# Patient Record
Sex: Male | Born: 1960
Health system: Southern US, Community
[De-identification: ages and names within clinical notes are randomized; demographics above are authoritative.]

## PROBLEM LIST (undated history)

## (undated) DIAGNOSIS — R011 Cardiac murmur, unspecified: Secondary | ICD-10-CM

## (undated) DIAGNOSIS — D649 Anemia, unspecified: Secondary | ICD-10-CM

## (undated) DIAGNOSIS — R000 Tachycardia, unspecified: Secondary | ICD-10-CM

## (undated) DIAGNOSIS — I10 Essential (primary) hypertension: Secondary | ICD-10-CM

## (undated) DIAGNOSIS — F1020 Alcohol dependence, uncomplicated: Secondary | ICD-10-CM

## (undated) DIAGNOSIS — D696 Thrombocytopenia, unspecified: Secondary | ICD-10-CM

## (undated) HISTORY — DX: Alcohol dependence, uncomplicated: F10.20

## (undated) HISTORY — DX: Cardiac murmur, unspecified: R01.1

## (undated) HISTORY — DX: Tachycardia, unspecified: R00.0

## (undated) HISTORY — DX: Thrombocytopenia, unspecified: D69.6

## (undated) HISTORY — DX: Anemia, unspecified: D64.9

## (undated) HISTORY — PX: NO PAST SURGERIES: SHX2092

---

## 2003-03-02 ENCOUNTER — Other Ambulatory Visit: Payer: Self-pay

## 2004-12-08 ENCOUNTER — Emergency Department: Payer: Self-pay | Admitting: Emergency Medicine

## 2006-02-24 ENCOUNTER — Inpatient Hospital Stay: Payer: Self-pay | Admitting: Internal Medicine

## 2006-02-24 ENCOUNTER — Other Ambulatory Visit: Payer: Self-pay

## 2006-12-26 ENCOUNTER — Emergency Department (HOSPITAL_COMMUNITY): Admission: EM | Admit: 2006-12-26 | Discharge: 2006-12-26 | Payer: Self-pay | Admitting: Emergency Medicine

## 2007-04-23 ENCOUNTER — Emergency Department (HOSPITAL_COMMUNITY): Admission: EM | Admit: 2007-04-23 | Discharge: 2007-04-23 | Payer: Self-pay | Admitting: Emergency Medicine

## 2007-05-26 ENCOUNTER — Emergency Department (HOSPITAL_COMMUNITY): Admission: EM | Admit: 2007-05-26 | Discharge: 2007-05-26 | Payer: Self-pay | Admitting: Emergency Medicine

## 2008-09-21 ENCOUNTER — Ambulatory Visit: Payer: Self-pay | Admitting: Internal Medicine

## 2008-09-21 ENCOUNTER — Inpatient Hospital Stay (HOSPITAL_COMMUNITY): Admission: EM | Admit: 2008-09-21 | Discharge: 2008-09-22 | Payer: Self-pay | Admitting: Emergency Medicine

## 2008-09-21 HISTORY — PX: ESOPHAGOGASTRODUODENOSCOPY: SHX1529

## 2008-10-12 ENCOUNTER — Emergency Department (HOSPITAL_COMMUNITY): Admission: EM | Admit: 2008-10-12 | Discharge: 2008-10-12 | Payer: Self-pay | Admitting: Internal Medicine

## 2008-10-22 ENCOUNTER — Encounter: Payer: Self-pay | Admitting: Internal Medicine

## 2008-11-26 ENCOUNTER — Emergency Department (HOSPITAL_COMMUNITY): Admission: EM | Admit: 2008-11-26 | Discharge: 2008-11-26 | Payer: Self-pay | Admitting: Emergency Medicine

## 2008-12-11 ENCOUNTER — Observation Stay: Payer: Self-pay | Admitting: Internal Medicine

## 2008-12-27 ENCOUNTER — Encounter (INDEPENDENT_AMBULATORY_CARE_PROVIDER_SITE_OTHER): Payer: Self-pay | Admitting: *Deleted

## 2009-02-19 ENCOUNTER — Emergency Department (HOSPITAL_COMMUNITY): Admission: EM | Admit: 2009-02-19 | Discharge: 2009-02-20 | Payer: Self-pay | Admitting: Emergency Medicine

## 2009-03-17 ENCOUNTER — Emergency Department (HOSPITAL_COMMUNITY): Admission: EM | Admit: 2009-03-17 | Discharge: 2009-03-17 | Payer: Self-pay | Admitting: Emergency Medicine

## 2009-04-28 ENCOUNTER — Emergency Department (HOSPITAL_COMMUNITY): Admission: EM | Admit: 2009-04-28 | Discharge: 2009-04-28 | Payer: Self-pay | Admitting: Emergency Medicine

## 2009-05-15 ENCOUNTER — Emergency Department: Payer: Self-pay | Admitting: Emergency Medicine

## 2009-06-05 ENCOUNTER — Emergency Department: Payer: Self-pay | Admitting: Emergency Medicine

## 2009-06-20 ENCOUNTER — Emergency Department (HOSPITAL_COMMUNITY): Admission: EM | Admit: 2009-06-20 | Discharge: 2009-06-20 | Payer: Self-pay | Admitting: Emergency Medicine

## 2009-06-28 ENCOUNTER — Inpatient Hospital Stay: Payer: Self-pay | Admitting: Psychiatry

## 2009-07-13 ENCOUNTER — Emergency Department: Payer: Self-pay | Admitting: Emergency Medicine

## 2010-04-15 NOTE — Consult Note (Signed)
Summary: Consultation Report  Consultation Report   Imported By: Diana Eves 10/22/2008 14:34:20  _____________________________________________________________________  External Attachment:    Type:   Image     Comment:   External Document

## 2010-06-01 LAB — BASIC METABOLIC PANEL
BUN: 12 mg/dL (ref 6–23)
CO2: 28 mEq/L (ref 19–32)
Calcium: 9 mg/dL (ref 8.4–10.5)
Chloride: 93 mEq/L — ABNORMAL LOW (ref 96–112)
Creatinine, Ser: 0.9 mg/dL (ref 0.4–1.5)
GFR calc Af Amer: >60 mL/min (ref >60)
GFR calc non Af Amer: >60 mL/min (ref >60)
Glucose, Bld: 97 mg/dL (ref 70–99)
Potassium: 3.6 mEq/L (ref 3.5–5.1)
Sodium: 128 mEq/L — ABNORMAL LOW (ref 135–145)

## 2010-06-04 LAB — RAPID URINE DRUG SCREEN, HOSP PERFORMED
Amphetamines: NOT DETECTED
Amphetamines: NOT DETECTED
Barbiturates: NOT DETECTED
Barbiturates: NOT DETECTED
Benzodiazepines: NOT DETECTED
Benzodiazepines: NOT DETECTED
Cocaine: NOT DETECTED
Cocaine: NOT DETECTED
Opiates: NOT DETECTED
Opiates: NOT DETECTED
Tetrahydrocannabinol: NOT DETECTED
Tetrahydrocannabinol: NOT DETECTED

## 2010-06-04 LAB — DIFFERENTIAL
Band Neutrophils: 0 % (ref 0–10)
Basophils Absolute: 0 10*3/uL (ref 0.0–0.1)
Basophils Absolute: 0.1 10*3/uL (ref 0.0–0.1)
Basophils Relative: 0 % (ref 0–1)
Basophils Relative: 2 % — ABNORMAL HIGH (ref 0–1)
Blasts: 0 %
Eosinophils Absolute: 0.7 10*3/uL (ref 0.0–0.7)
Eosinophils Absolute: 0.1 10*3/uL (ref 0.0–0.7)
Eosinophils Relative: 11 % — ABNORMAL HIGH (ref 0–5)
Eosinophils Relative: 2 % (ref 0–5)
Lymphocytes Relative: 36 % (ref 12–46)
Lymphocytes Relative: 16 % (ref 12–46)
Lymphs Abs: 2.4 10*3/uL (ref 0.7–4.0)
Lymphs Abs: 0.9 10*3/uL (ref 0.7–4.0)
Metamyelocytes Relative: 0 %
Monocytes Absolute: 0.5 10*3/uL (ref 0.1–1.0)
Monocytes Absolute: 0.7 10*3/uL (ref 0.1–1.0)
Monocytes Relative: 8 % (ref 3–12)
Monocytes Relative: 12 % (ref 3–12)
Myelocytes: 0 %
Neutro Abs: 2.9 10*3/uL (ref 1.7–7.7)
Neutro Abs: 4 10*3/uL (ref 1.7–7.7)
Neutrophils Relative %: 45 % (ref 43–77)
Neutrophils Relative %: 68 % (ref 43–77)
Promyelocytes Absolute: 0 %
nRBC: 0 /100 WBC

## 2010-06-04 LAB — CBC
HCT: 31 % — ABNORMAL LOW (ref 39.0–52.0)
HCT: 28.3 % — ABNORMAL LOW (ref 39.0–52.0)
Hemoglobin: 10.1 g/dL — ABNORMAL LOW (ref 13.0–17.0)
Hemoglobin: 9.1 g/dL — ABNORMAL LOW (ref 13.0–17.0)
MCHC: 32.6 g/dL (ref 30.0–36.0)
MCHC: 32.3 g/dL (ref 30.0–36.0)
MCV: 80 fL (ref 78.0–100.0)
MCV: 75.5 fL — ABNORMAL LOW (ref 78.0–100.0)
Platelets: 150 10*3/uL (ref 150–400)
Platelets: 157 10*3/uL (ref 150–400)
RBC: 3.88 MIL/uL — ABNORMAL LOW (ref 4.22–5.81)
RBC: 3.75 MIL/uL — ABNORMAL LOW (ref 4.22–5.81)
RDW: 28.3 % — ABNORMAL HIGH (ref 11.5–15.5)
RDW: 18.9 % — ABNORMAL HIGH (ref 11.5–15.5)
WBC: 6.5 10*3/uL (ref 4.0–10.5)
WBC: 5.8 10*3/uL (ref 4.0–10.5)

## 2010-06-04 LAB — BASIC METABOLIC PANEL
BUN: 7 mg/dL (ref 6–23)
BUN: 11 mg/dL (ref 6–23)
CO2: 25 mEq/L (ref 19–32)
CO2: 27 mEq/L (ref 19–32)
Calcium: 8.6 mg/dL (ref 8.4–10.5)
Calcium: 9 mg/dL (ref 8.4–10.5)
Chloride: 104 mEq/L (ref 96–112)
Chloride: 98 mEq/L (ref 96–112)
Creatinine, Ser: 0.74 mg/dL (ref 0.4–1.5)
Creatinine, Ser: 0.77 mg/dL (ref 0.4–1.5)
GFR calc Af Amer: >60 mL/min (ref >60)
GFR calc Af Amer: >60 mL/min (ref >60)
GFR calc non Af Amer: >60 mL/min (ref >60)
GFR calc non Af Amer: >60 mL/min (ref >60)
Glucose, Bld: 93 mg/dL (ref 70–99)
Glucose, Bld: 100 mg/dL — ABNORMAL HIGH (ref 70–99)
Potassium: 3.6 mEq/L (ref 3.5–5.1)
Potassium: 3.5 mEq/L (ref 3.5–5.1)
Sodium: 135 mEq/L (ref 135–145)
Sodium: 133 mEq/L — ABNORMAL LOW (ref 135–145)

## 2010-06-04 LAB — ETHANOL
Alcohol, Ethyl (B): 183 mg/dL — ABNORMAL HIGH (ref 0–10)
Alcohol, Ethyl (B): 264 mg/dL — ABNORMAL HIGH (ref 0–10)
Alcohol, Ethyl (B): <5 mg/dL (ref 0–10)

## 2010-06-04 LAB — POCT CARDIAC MARKERS
CKMB, poc: <1 ng/mL — ABNORMAL LOW (ref 1.0–8.0)
Myoglobin, poc: 42.2 ng/mL (ref 12–200)
Troponin i, poc: <0.05 ng/mL (ref 0.00–0.09)

## 2010-06-17 LAB — POCT CARDIAC MARKERS
CKMB, poc: <1 ng/mL — ABNORMAL LOW (ref 1.0–8.0)
CKMB, poc: <1 ng/mL — ABNORMAL LOW (ref 1.0–8.0)
Myoglobin, poc: 74.3 ng/mL (ref 12–200)
Myoglobin, poc: 79.9 ng/mL (ref 12–200)
Troponin i, poc: <0.05 ng/mL (ref 0.00–0.09)
Troponin i, poc: <0.05 ng/mL (ref 0.00–0.09)

## 2010-06-17 LAB — DIFFERENTIAL
Basophils Absolute: 0 10*3/uL (ref 0.0–0.1)
Basophils Relative: 0 % (ref 0–1)
Eosinophils Absolute: 0.1 10*3/uL (ref 0.0–0.7)
Eosinophils Relative: 1 % (ref 0–5)
Lymphocytes Relative: 19 % (ref 12–46)
Lymphs Abs: 1.3 10*3/uL (ref 0.7–4.0)
Monocytes Absolute: 0.8 10*3/uL (ref 0.1–1.0)
Monocytes Relative: 12 % (ref 3–12)
Neutro Abs: 4.6 10*3/uL (ref 1.7–7.7)
Neutrophils Relative %: 68 % (ref 43–77)

## 2010-06-17 LAB — BASIC METABOLIC PANEL
BUN: 7 mg/dL (ref 6–23)
CO2: 28 mEq/L (ref 19–32)
Calcium: 9.3 mg/dL (ref 8.4–10.5)
Chloride: 96 mEq/L (ref 96–112)
Creatinine, Ser: 0.85 mg/dL (ref 0.4–1.5)
GFR calc Af Amer: >60 mL/min (ref >60)
GFR calc non Af Amer: >60 mL/min (ref >60)
Glucose, Bld: 96 mg/dL (ref 70–99)
Potassium: 3.4 mEq/L — ABNORMAL LOW (ref 3.5–5.1)
Sodium: 132 mEq/L — ABNORMAL LOW (ref 135–145)

## 2010-06-17 LAB — CBC
HCT: 34 % — ABNORMAL LOW (ref 39.0–52.0)
Hemoglobin: 10.9 g/dL — ABNORMAL LOW (ref 13.0–17.0)
MCHC: 32.1 g/dL (ref 30.0–36.0)
MCV: 76.8 fL — ABNORMAL LOW (ref 78.0–100.0)
Platelets: 148 10*3/uL — ABNORMAL LOW (ref 150–400)
RBC: 4.42 MIL/uL (ref 4.22–5.81)
RDW: 18.1 % — ABNORMAL HIGH (ref 11.5–15.5)
WBC: 6.8 10*3/uL (ref 4.0–10.5)

## 2010-06-17 LAB — D-DIMER, QUANTITATIVE: D-Dimer, Quant: 0.22 ug/mL-FEU (ref 0.00–0.48)

## 2010-06-20 LAB — BASIC METABOLIC PANEL
BUN: 4 mg/dL — ABNORMAL LOW (ref 6–23)
CO2: 30 mEq/L (ref 19–32)
Calcium: 9.1 mg/dL (ref 8.4–10.5)
Chloride: 98 mEq/L (ref 96–112)
Creatinine, Ser: 0.98 mg/dL (ref 0.4–1.5)
GFR calc Af Amer: >60 mL/min (ref >60)
GFR calc non Af Amer: >60 mL/min (ref >60)
Glucose, Bld: 164 mg/dL — ABNORMAL HIGH (ref 70–99)
Potassium: 3.3 mEq/L — ABNORMAL LOW (ref 3.5–5.1)
Sodium: 133 mEq/L — ABNORMAL LOW (ref 135–145)

## 2010-06-20 LAB — DIFFERENTIAL
Band Neutrophils: 0 % (ref 0–10)
Basophils Absolute: 0 10*3/uL (ref 0.0–0.1)
Basophils Relative: 1 % (ref 0–1)
Blasts: 0 %
Eosinophils Absolute: 0.1 10*3/uL (ref 0.0–0.7)
Eosinophils Relative: 3 % (ref 0–5)
Lymphocytes Relative: 24 % (ref 12–46)
Lymphs Abs: 1.2 10*3/uL (ref 0.7–4.0)
Metamyelocytes Relative: 0 %
Monocytes Absolute: 0.7 10*3/uL (ref 0.1–1.0)
Monocytes Relative: 14 % — ABNORMAL HIGH (ref 3–12)
Myelocytes: 0 %
Neutro Abs: 2.8 10*3/uL (ref 1.7–7.7)
Neutrophils Relative %: 58 % (ref 43–77)
Promyelocytes Absolute: 0 %
WBC Morphology: INCREASED
nRBC: 0 /100 WBC

## 2010-06-20 LAB — POCT CARDIAC MARKERS
CKMB, poc: <1 ng/mL — ABNORMAL LOW (ref 1.0–8.0)
CKMB, poc: <1 ng/mL — ABNORMAL LOW (ref 1.0–8.0)
Myoglobin, poc: 43.7 ng/mL (ref 12–200)
Myoglobin, poc: 45.4 ng/mL (ref 12–200)
Troponin i, poc: <0.05 ng/mL (ref 0.00–0.09)
Troponin i, poc: <0.05 ng/mL (ref 0.00–0.09)

## 2010-06-20 LAB — CBC
HCT: 32.5 % — ABNORMAL LOW (ref 39.0–52.0)
Hemoglobin: 10.7 g/dL — ABNORMAL LOW (ref 13.0–17.0)
MCHC: 33 g/dL (ref 30.0–36.0)
MCV: 74.2 fL — ABNORMAL LOW (ref 78.0–100.0)
Platelets: 115 10*3/uL — ABNORMAL LOW (ref 150–400)
RBC: 4.38 MIL/uL (ref 4.22–5.81)
RDW: 20.2 % — ABNORMAL HIGH (ref 11.5–15.5)
WBC: 4.8 10*3/uL (ref 4.0–10.5)

## 2010-06-22 LAB — DIFFERENTIAL
Band Neutrophils: 0 % (ref 0–10)
Band Neutrophils: 0 % (ref 0–10)
Band Neutrophils: 0 % (ref 0–10)
Basophils Absolute: 0 10*3/uL (ref 0.0–0.1)
Basophils Absolute: 0 10*3/uL (ref 0.0–0.1)
Basophils Absolute: 0 10*3/uL (ref 0.0–0.1)
Basophils Absolute: 0 10*3/uL (ref 0.0–0.1)
Basophils Relative: 0 % (ref 0–1)
Basophils Relative: 0 % (ref 0–1)
Basophils Relative: 0 % (ref 0–1)
Basophils Relative: 1 % (ref 0–1)
Blasts: 0 %
Blasts: 0 %
Blasts: 0 %
Eosinophils Absolute: 0.5 10*3/uL (ref 0.0–0.7)
Eosinophils Absolute: 0.4 10*3/uL (ref 0.0–0.7)
Eosinophils Absolute: 0.5 10*3/uL (ref 0.0–0.7)
Eosinophils Absolute: 0.5 10*3/uL (ref 0.0–0.7)
Eosinophils Relative: 8 % — ABNORMAL HIGH (ref 0–5)
Eosinophils Relative: 8 % — ABNORMAL HIGH (ref 0–5)
Eosinophils Relative: 9 % — ABNORMAL HIGH (ref 0–5)
Eosinophils Relative: 9 % — ABNORMAL HIGH (ref 0–5)
Lymphocytes Relative: 32 % (ref 12–46)
Lymphocytes Relative: 30 % (ref 12–46)
Lymphocytes Relative: 37 % (ref 12–46)
Lymphocytes Relative: 37 % (ref 12–46)
Lymphs Abs: 2.1 10*3/uL (ref 0.7–4.0)
Lymphs Abs: 1.4 10*3/uL (ref 0.7–4.0)
Lymphs Abs: 1.9 10*3/uL (ref 0.7–4.0)
Lymphs Abs: 1.9 10*3/uL (ref 0.7–4.0)
Metamyelocytes Relative: 0 %
Metamyelocytes Relative: 0 %
Metamyelocytes Relative: 0 %
Monocytes Absolute: 0.3 10*3/uL (ref 0.1–1.0)
Monocytes Absolute: 0.2 10*3/uL (ref 0.1–1.0)
Monocytes Absolute: 0.2 10*3/uL (ref 0.1–1.0)
Monocytes Absolute: 0.5 10*3/uL (ref 0.1–1.0)
Monocytes Relative: 4 % (ref 3–12)
Monocytes Relative: 11 % (ref 3–12)
Monocytes Relative: 3 % (ref 3–12)
Monocytes Relative: 3 % (ref 3–12)
Myelocytes: 0 %
Myelocytes: 0 %
Myelocytes: 0 %
Neutro Abs: 3.6 10*3/uL (ref 1.7–7.7)
Neutro Abs: 2.2 10*3/uL (ref 1.7–7.7)
Neutro Abs: 2.4 10*3/uL (ref 1.7–7.7)
Neutro Abs: 2.4 10*3/uL (ref 1.7–7.7)
Neutrophils Relative %: 56 % (ref 43–77)
Neutrophils Relative %: 50 % (ref 43–77)
Neutrophils Relative %: 51 % (ref 43–77)
Neutrophils Relative %: 51 % (ref 43–77)
Promyelocytes Absolute: 0 %
Promyelocytes Absolute: 0 %
Promyelocytes Absolute: 0 %
Smear Review: DECREASED
nRBC: 0 /100 WBC
nRBC: 0 /100 WBC
nRBC: 0 /100 WBC

## 2010-06-22 LAB — CBC
HCT: 29.5 % — ABNORMAL LOW (ref 39.0–52.0)
HCT: 20.7 % — ABNORMAL LOW (ref 39.0–52.0)
HCT: 20.9 % — ABNORMAL LOW (ref 39.0–52.0)
HCT: 24.7 % — ABNORMAL LOW (ref 39.0–52.0)
HCT: 25 % — ABNORMAL LOW (ref 39.0–52.0)
Hemoglobin: 9.8 g/dL — ABNORMAL LOW (ref 13.0–17.0)
Hemoglobin: 6.7 g/dL — CL (ref 13.0–17.0)
Hemoglobin: 6.8 g/dL — CL (ref 13.0–17.0)
Hemoglobin: 8.2 g/dL — ABNORMAL LOW (ref 13.0–17.0)
Hemoglobin: 8.3 g/dL — ABNORMAL LOW (ref 13.0–17.0)
MCHC: 33.1 g/dL (ref 30.0–36.0)
MCHC: 32.3 g/dL (ref 30.0–36.0)
MCHC: 32.5 g/dL (ref 30.0–36.0)
MCHC: 33.3 g/dL (ref 30.0–36.0)
MCHC: 33.3 g/dL (ref 30.0–36.0)
MCV: 73.2 fL — ABNORMAL LOW (ref 78.0–100.0)
MCV: 70.8 fL — ABNORMAL LOW (ref 78.0–100.0)
MCV: 71.4 fL — ABNORMAL LOW (ref 78.0–100.0)
MCV: 75.4 fL — ABNORMAL LOW (ref 78.0–100.0)
MCV: 75.6 fL — ABNORMAL LOW (ref 78.0–100.0)
Platelets: 116 10*3/uL — ABNORMAL LOW (ref 150–400)
Platelets: 116 10*3/uL — ABNORMAL LOW (ref 150–400)
Platelets: 123 10*3/uL — ABNORMAL LOW (ref 150–400)
Platelets: 95 10*3/uL — ABNORMAL LOW (ref 150–400)
Platelets: 97 10*3/uL — ABNORMAL LOW (ref 150–400)
RBC: 4.02 MIL/uL — ABNORMAL LOW (ref 4.22–5.81)
RBC: 2.92 MIL/uL — ABNORMAL LOW (ref 4.22–5.81)
RBC: 2.93 MIL/uL — ABNORMAL LOW (ref 4.22–5.81)
RBC: 3.27 MIL/uL — ABNORMAL LOW (ref 4.22–5.81)
RBC: 3.31 MIL/uL — ABNORMAL LOW (ref 4.22–5.81)
RDW: 21.2 % — ABNORMAL HIGH (ref 11.5–15.5)
RDW: 18.3 % — ABNORMAL HIGH (ref 11.5–15.5)
RDW: 18.7 % — ABNORMAL HIGH (ref 11.5–15.5)
RDW: 20.6 % — ABNORMAL HIGH (ref 11.5–15.5)
RDW: 20.9 % — ABNORMAL HIGH (ref 11.5–15.5)
WBC: 6.5 10*3/uL (ref 4.0–10.5)
WBC: 3.9 10*3/uL — ABNORMAL LOW (ref 4.0–10.5)
WBC: 4.5 10*3/uL (ref 4.0–10.5)
WBC: 5 10*3/uL (ref 4.0–10.5)
WBC: 5 10*3/uL (ref 4.0–10.5)

## 2010-06-22 LAB — BASIC METABOLIC PANEL
BUN: 8 mg/dL (ref 6–23)
BUN: 10 mg/dL (ref 6–23)
BUN: 10 mg/dL (ref 6–23)
BUN: 11 mg/dL (ref 6–23)
CO2: 26 mEq/L (ref 19–32)
CO2: 26 mEq/L (ref 19–32)
CO2: 26 mEq/L (ref 19–32)
CO2: 27 mEq/L (ref 19–32)
Calcium: 8.9 mg/dL (ref 8.4–10.5)
Calcium: 8.5 mg/dL (ref 8.4–10.5)
Calcium: 8.7 mg/dL (ref 8.4–10.5)
Calcium: 8.8 mg/dL (ref 8.4–10.5)
Chloride: 98 mEq/L (ref 96–112)
Chloride: 100 mEq/L (ref 96–112)
Chloride: 100 mEq/L (ref 96–112)
Chloride: 99 mEq/L (ref 96–112)
Creatinine, Ser: 0.86 mg/dL (ref 0.4–1.5)
Creatinine, Ser: 0.81 mg/dL (ref 0.4–1.5)
Creatinine, Ser: 0.97 mg/dL (ref 0.4–1.5)
Creatinine, Ser: 1.02 mg/dL (ref 0.4–1.5)
GFR calc Af Amer: >60 mL/min (ref >60)
GFR calc Af Amer: >60 mL/min (ref >60)
GFR calc Af Amer: >60 mL/min (ref >60)
GFR calc Af Amer: >60 mL/min (ref >60)
GFR calc non Af Amer: >60 mL/min (ref >60)
GFR calc non Af Amer: >60 mL/min (ref >60)
GFR calc non Af Amer: >60 mL/min (ref >60)
GFR calc non Af Amer: >60 mL/min (ref >60)
Glucose, Bld: 95 mg/dL (ref 70–99)
Glucose, Bld: 107 mg/dL — ABNORMAL HIGH (ref 70–99)
Glucose, Bld: 111 mg/dL — ABNORMAL HIGH (ref 70–99)
Glucose, Bld: 89 mg/dL (ref 70–99)
Potassium: 3.1 mEq/L — ABNORMAL LOW (ref 3.5–5.1)
Potassium: 3.2 mEq/L — ABNORMAL LOW (ref 3.5–5.1)
Potassium: 3.8 mEq/L (ref 3.5–5.1)
Potassium: 4.5 mEq/L (ref 3.5–5.1)
Sodium: 131 mEq/L — ABNORMAL LOW (ref 135–145)
Sodium: 131 mEq/L — ABNORMAL LOW (ref 135–145)
Sodium: 131 mEq/L — ABNORMAL LOW (ref 135–145)
Sodium: 132 mEq/L — ABNORMAL LOW (ref 135–145)

## 2010-06-22 LAB — HEPATIC FUNCTION PANEL
ALT: 12 U/L (ref 0–53)
AST: 19 U/L (ref 0–37)
Albumin: 3.3 g/dL — ABNORMAL LOW (ref 3.5–5.2)
Alkaline Phosphatase: 56 U/L (ref 39–117)
Bilirubin, Direct: 0.1 mg/dL (ref 0.0–0.3)
Indirect Bilirubin: 0.3 mg/dL (ref 0.3–0.9)
Total Bilirubin: 0.4 mg/dL (ref 0.3–1.2)
Total Protein: 6 g/dL (ref 6.0–8.3)

## 2010-06-22 LAB — POCT CARDIAC MARKERS
CKMB, poc: <1 ng/mL — ABNORMAL LOW (ref 1.0–8.0)
CKMB, poc: <1 ng/mL — ABNORMAL LOW (ref 1.0–8.0)
CKMB, poc: <1 ng/mL — ABNORMAL LOW (ref 1.0–8.0)
CKMB, poc: <1 ng/mL — ABNORMAL LOW (ref 1.0–8.0)
Myoglobin, poc: 39.8 ng/mL (ref 12–200)
Myoglobin, poc: 39.8 ng/mL (ref 12–200)
Myoglobin, poc: 29.1 ng/mL (ref 12–200)
Myoglobin, poc: 31.7 ng/mL (ref 12–200)
Troponin i, poc: <0.05 ng/mL (ref 0.00–0.09)
Troponin i, poc: <0.05 ng/mL (ref 0.00–0.09)
Troponin i, poc: <0.05 ng/mL (ref 0.00–0.09)
Troponin i, poc: <0.05 ng/mL (ref 0.00–0.09)

## 2010-06-22 LAB — CROSSMATCH
ABO/RH(D): O POS
Antibody Screen: NEGATIVE

## 2010-06-22 LAB — CARDIAC PANEL(CRET KIN+CKTOT+MB+TROPI)
CK, MB: 1.3 ng/mL (ref 0.3–4.0)
CK, MB: 1.4 ng/mL (ref 0.3–4.0)
Relative Index: INVALID (ref 0.0–2.5)
Relative Index: INVALID (ref 0.0–2.5)
Total CK: 50 U/L (ref 7–232)
Total CK: 54 U/L (ref 7–232)
Troponin I: 0.03 ng/mL (ref 0.00–0.06)
Troponin I: 0.03 ng/mL (ref 0.00–0.06)

## 2010-06-22 LAB — COMPREHENSIVE METABOLIC PANEL
ALT: 13 U/L (ref 0–53)
AST: 22 U/L (ref 0–37)
Albumin: 3.9 g/dL (ref 3.5–5.2)
Alkaline Phosphatase: 63 U/L (ref 39–117)
BUN: 10 mg/dL (ref 6–23)
CO2: 26 mEq/L (ref 19–32)
Calcium: 8.9 mg/dL (ref 8.4–10.5)
Chloride: 99 mEq/L (ref 96–112)
Creatinine, Ser: 1.04 mg/dL (ref 0.4–1.5)
GFR calc Af Amer: >60 mL/min (ref >60)
GFR calc non Af Amer: >60 mL/min (ref >60)
Glucose, Bld: 129 mg/dL — ABNORMAL HIGH (ref 70–99)
Potassium: 3 mEq/L — ABNORMAL LOW (ref 3.5–5.1)
Sodium: 132 mEq/L — ABNORMAL LOW (ref 135–145)
Total Bilirubin: 0.2 mg/dL — ABNORMAL LOW (ref 0.3–1.2)
Total Protein: 6.8 g/dL (ref 6.0–8.3)

## 2010-06-22 LAB — ETHANOL: Alcohol, Ethyl (B): 202 mg/dL — ABNORMAL HIGH (ref 0–10)

## 2010-06-22 LAB — IRON: Iron: <10 ug/dL — ABNORMAL LOW (ref 42–135)

## 2010-06-22 LAB — SAMPLE TO BLOOD BANK

## 2010-06-22 LAB — PROTIME-INR
INR: 1.2 (ref 0.00–1.49)
Prothrombin Time: 15.2 seconds (ref 11.6–15.2)

## 2010-06-22 LAB — ABO/RH: ABO/RH(D): O POS

## 2010-06-22 LAB — D-DIMER, QUANTITATIVE: D-Dimer, Quant: 0.22 ug/mL-FEU (ref 0.00–0.48)

## 2010-06-22 LAB — VITAMIN B12: Vitamin B-12: 362 pg/mL (ref 211–911)

## 2010-06-22 LAB — FOLATE: Folate: 9.9 ng/mL

## 2010-06-22 LAB — H. PYLORI ANTIBODY, IGG: H Pylori IgG: <0.4 {ISR}

## 2010-06-22 LAB — FERRITIN: Ferritin: 3 ng/mL — ABNORMAL LOW (ref 22–322)

## 2010-07-29 NOTE — Discharge Summary (Signed)
NAMECARMELLO, William Barrett             ACCOUNT NO.:  [id number]   MEDICAL RECORD NO.:  [id number]          PATIENT TYPE:  INP   LOCATION:  A319                          FACILITY:  [facility]   PHYSICIAN:  Osvaldo Shipper, MD     DATE OF BIRTH:  10-03-1960   DATE OF ADMISSION:  09/20/2008  DATE OF DISCHARGE:  07/10/2010LH                               DISCHARGE SUMMARY   The patient goes to [facility] for his primary  medical needs.   Consultation during this admission by Gastroenterology   Procedures include EGD.   DIAGNOSES AT THE TIME OF DISCHARGE:  1. Severe reflux esophagitis.  2. Severe iron deficiency anemia, requiring transfusion.  3. Alcoholism.  4. Thrombocytopenia likely secondary to alcoholism.  5. Evidence for liver cirrhosis on imaging studies.  6. Chest pain possibly related to gastrointestinal symptomatology,      resolved.   Please review H and P dictated at the time of admission for details  regarding the patient's presenting illness.   BRIEF HOSPITAL COURSE:  1. Chest pain, this is a 50 year old Caucasian male who presented with      retrosternal chest pain.  The patient had a nonacute EKG.  He has      ruled out for acute coronary syndrome.  So I think the chest pain      was most likely GI in origin.  He needs to follow up with his      doctor for the same.  2. Severe anemia.  He was found to have a hemoglobin of 6.7 with MCV      of 70, so this was microcytic.  Anemia panel was sent off and it      showed iron deficiency with a ferritin of 3.  He probably has      chronic GI loss because of reflux disease.  He underwent endoscopy,      which showed erosive reflux esophagitis.  Nu-Iron will be      prescribed to him.  3. Reflux esophagitis was detected on EGD.  Protonix will be continued      at home.  GI will follow him up as an outpatient.  4. Severe alcoholism.  He drinks quite a bit, upto 6 cans of beer on a      daily basis.  I have  explained the adverse effects of doing so and      I have extensively counseled him.  He will be discharged on      thiamine.  5. Liver cirrhosis.  Based on a CAT scan that was done, which showed      evidence for nodular appearance of the liver suggestive of      cirrhosis.  Associated splenomegaly was also noted.  Once again      alcohol cessation was recommended to the patient.  GI will follow      him up for the same as an outpatient.  6. Thrombocytopenia thought to be secondary to alcoholism.  No      bleeding was noted in the hospital.  His platelet count was 97,  000.   On the day of discharge, the patient is feeling well.  He is keen on  going home.  Denies any nausea, vomiting, diarrhea, no blood in the  stool, no black stools, and no pain.  His vital signs are all stable.  Blood pressure is 114/71, heart rate is 83.  Abdomen is soft.  Lungs are  clear.  Cardiac exam benign, so overall he is stable for discharge.   DISCHARGE MEDICATIONS:  1. Nu-Iron 150 mg once daily 1 for 2 weeks and then b.i.d.  2. Protonix 40 mg once daily.  3. Thiamine 100 mg once daily.  4. Lisinopril 10 mg daily.  5. Multivitamins 1 tablet daily.  6. He has been asked to hold his aspirin until he seen in followup by      Dr. Jena Gauss.   OTHER INSTRUCTIONS:  He has been asked to avoid Motrin, NSAIDs, and  caffeine.  Alcohol cessation was emphasized.   DIET:  Per GI recommendations.   PHYSICAL ACTIVITY:  No restrictions.   IMAGING STUDIES:  Apart from the CT, which showed cirrhotic liver,  splenomegaly.  He also had a chest x-ray, which showed mild cardiac  enlargement without acute infiltrates.   Follow up with [facility] in 1 week. With  Gastroenterology as well.  He will need to have a repeat endoscopy in  the next few weeks.   Total time on this encounter was 40 minutes.      Osvaldo Shipper, MD  Electronically Signed     GK/MEDQ  D:  09/22/2008  T:  09/22/2008   Job:  [id number]   cc:   [facility]   R. Roetta Sessions, M.D.  [address]  [place]  [place] [address]

## 2010-07-29 NOTE — Consult Note (Signed)
NAMEMALE, MINISH             ACCOUNT NO.:  [id number]   MEDICAL RECORD NO.:  [id number]          PATIENT TYPE:  INP   LOCATION:  A319                          FACILITY:  [facility]   PHYSICIAN:  R. Roetta Sessions, M.D. DATE OF BIRTH:  January 15, 1961   DATE OF CONSULTATION:  09/21/2008  DATE OF DISCHARGE:                                 CONSULTATION   REASON FOR CONSULTATION:  Anemia.   PRIMARY CARE PHYSICIAN:  [facility].   HISTORY OF PRESENT ILLNESS:  The patient is a 50 year old Caucasian  gentleman with reported mild mental retardation, who presented to the  hospital yesterday with acute onset of chest pain.  He described pain in  the left side of his chest.  He described it as sharp.  He had a little  bit of shortness of breath with it.  He was not exerting himself when it  occurred.  Denies any injury.  He came to the emergency department,  drove himself.  By the time he was in the ED, his pain was improving.  This morning, he has some mild soreness.  However in the ED, he was  noted to have a hemoglobin of 6.8.  His platelet count was low at  120,000.  MCV low at 70.8.  He was admitted for further management.  He  has had 3 sets of cardiac enzymes which were negative.  Chest x-ray  showed mild cardiac enlargement.  He has a known history of murmur, but  he is not able to tell how much workup he has had in the past.  He is  not followed by a cardiologist.  The patient also has a history of  tobacco and alcohol abuse.  He smokes up to 3 packs of cigarettes daily.  He drinks about six 12 ounce cans of beer daily.  He has done this  chronically.  He denies any NSAID use, but is on a daily baby aspirin.  The patient complains of intermittent heartburn especially when he  smokes.  He denies dysphagia, odynophagia, nausea, vomiting, abdominal  pain, weight loss, weight gain, constipation, diarrhea, melena, rectal  bleeding, hematuria, nosebleeds.   HOME MEDICATIONS:  1. Lisinopril 10 mg daily.  2. Aspirin 81 mg daily.  3. Multivitamin daily.   ALLERGIES:  NO KNOWN DRUG ALLERGIES.   PAST MEDICAL HISTORY:  1. Hypertension.  2. Mild mental retardation.  3. Heart murmur.  4. History of alcohol and tobacco abuse.  5. Depression.   PAST SURGICAL HISTORY:  None.   FAMILY HISTORY:  Mother is deceased a few years ago.  He denies family  history of chronic GI illnesses,  colorectal cancer or liver disease.   SOCIAL HISTORY:  He is married.  He lives with his wife.  He is disabled  secondary to his nerves.  He does not have any children.  He smokes 3  packs of cigarettes daily.  He drinks six 12 ounce cans of beer daily  and has done this chronically.   REVIEW OF SYSTEMS:  See HPI for GI, constitutional, genitourinary,  cardiopulmonary.   PHYSICAL EXAMINATION:  VITAL SIGNS:  Temperature 97.8, pulse 88,  respirations 20, blood pressure 100/55.  GENERAL:  Pleasant, tanned, Caucasian gentleman in no acute distress.  SKIN:  Warm and dry.  No jaundice.  HEENT:  Sclerae are nonicteric.  Oropharyngeal mucosa moist and pink.  No lesions, erythema or exudate.  Dentition in poor repair with multiple  missing teeth.  No lymphadenopathy.  CHEST:  Diffuse wheezing throughout.  CARDIAC:  Regular rate and rhythm, 3/6 harsh murmur heard in the mid  precardium.  ABDOMEN:  Somewhat protuberant.  Somewhat more prominent vasculature.  Liver edge is palpable easily 7 fingerbreadths below the right costal  margin.  I did not appreciate spleen.  Abdomen is soft, nontender.  No  abdominal hernias or bruits.  LOWER EXTREMITIES:  No edema.  RECTAL:  Digital rectal exam revealed no masses externally.  Brown  secretions were hemoccult negative.  No stool present otherwise.  Otherwise, digital rectal exam normal.   LABORATORY DATA:  Sodium 132, potassium 3.2, BUN 10, creatinine 1.02,  glucose 111, white count 5,000, hemoglobin 6.7.  His is finishing his  second unit of  packed red blood cells now.  MCV 70.8, platelets 123,000,  eosinophil count is 9%, total bilirubin 0.2, alkaline phosphatase 63,  AST 22, ALT 13, albumin 3.9.  Anemia panel pending.   IMPRESSION:  The patient is a 50 year old gentleman with profound  microcytic anemia with history of alcohol abuse and chronic  gastroesophageal reflux disease.  He denies any dizziness or fatigue.  I  suspect he has had chronic occult gastrointestinal bleeding, although he  is hemoccult negative on exam today.  Differential diagnosis at this  time is quite broad, but from a gastrointestinal standpoint, would  consider complicated gastroesophageal reflux disease, peptic ulcer  disease, arteriovenous malformations, portal gastropathy (as we cannot  rule out chronic liver disease at this time with mild thrombocytopenia,  alcohol abuse and apparent hepatomegaly on exam.)  Consider lower  gastrointestinal source as well such as colorectal cancer.  He has mild  hypokalemia that needs to be corrected today.  He has quite pronounced  heart murmur on exam.  It is unclear at this point what sort of workup  he had endured in the past.  We will leave this up to the attending's  discretion.   RECOMMENDATIONS:  1. Continue PPI therapy.  2. Consider upper endoscopy followed by colonoscopy for further      evaluation of microcytic anemia and GERD symptoms.  3. Agree with CT of the abdomen and pelvis to further evaluate      hepatomegaly as well as rule out other source for anemia.  4. Evaluation for heart murmur as per attending.  5. Replete potassium.   I would like to thank [facility] for allowing [place] to take part  in the care of this patient.      Tana Coast, P.AJonathon Bellows, M.D.  Electronically Signed    LL/MEDQ  D:  09/21/2008  T:  09/21/2008  Job:  [id number]   cc:   [facility]oda Shut[facility] Team

## 2010-07-29 NOTE — Op Note (Signed)
NAMEDRAXTON, LUU             ACCOUNT NO.:  [id number]   MEDICAL RECORD NO.:  [id number]          PATIENT TYPE:  INP   LOCATION:  A319                          FACILITY:  [facility]   PHYSICIAN:  R. Roetta Sessions, M.D. DATE OF BIRTH:  [birth year]   DATE OF PROCEDURE:  09/21/2008  DATE OF DISCHARGE:                               OPERATIVE REPORT   PROCEDURE:  Diagnostic esophagogastroduodenoscopy.   INDICATIONS FOR PROCEDURE:  This is a 50 year old gentleman who presents  with atypical chest pain, found have for profound microcytic anemia for  which he is being transfused.  He is Hemoccult negative, clinically no  hematochezia, hematemesis, or melena.  He does have slightly elevated  eosinophil count.  Coarse, irregular-appearing liver and enlarged spleen  found on CT scan,  He does use alcohol on a regular basis.  EGD now  being done to further evaluate atypical chest pain, gastroesophageal  reflux disease symptoms, and microcytic anemia.  This approach has been  discussed with the patient previously and again today at the bedside.  The risks, benefits, alternatives, and limitations have been reviewed.  Please see documentation in the medical record.   PROCEDURE NOTE:  O2 saturation, blood pressure, pulse, and respirations  were monitored throughout the entire procedure.  Conscious sedation with  Versed 5 mg IV and Demerol 75 mg IV in divided doses.  Cetacaine spray  for topical pharyngeal anesthesia.   INSTRUMENT:  Pentax video chip system.   FINDINGS:  The examination of the tubular esophagus revealed 4-quadrant  distal esophageal erosions.  There is no Barrett's esophagus.  There are  no varices.  The EG junction easily traversed.   Stomach:  The gastric cavity was empty and insufflated well with air.  Thorough examination of the gastric mucosa, including retroflexion of  proximal stomach and esophagogastric, demonstrated two approximately 5-  mm prepyloric ulcers without  bleeding stigmata.  They appeared to be  benign lesions.  There was no evidence of portal gastropathy or other  abnormality.  The pylorus patent and easily traversed.  Examination of  the bulb and second portion revealed no abnormalities.  Therapeutic/diagnostic maneuvers performed:  None.   The patient tolerated the procedure well and was reactive to endoscopy.   IMPRESSION:  1. Distal esophageal erosions consistent with erosive reflux      esophagitis.  2. Small hiatal hernia.  3. Two benign-appearing prepyloric ulcers, otherwise unremarkable      stomach, patent pylorus, normal D1/D2.   RECOMMENDATIONS:  1. Avoid NSAID.  2. Continue Protonix 40 grams orally daily.  3. Check H. pylori serologies.  4. Advance him to a 4-gram sodium diet.  5. Would plan to bring this gentleman back in 12 weeks to make plans      to repeat his EGD and perform a colonoscopy at that time.      Jonathon Bellows, M.D.  Electronically Signed     RMR/MEDQ  D:  09/21/2008  T:  09/21/2008  Job:  [id number]

## 2010-07-29 NOTE — H&P (Signed)
NAMEDELWIN, RACZKOWSKI             ACCOUNT NO.:  [id number]   MEDICAL RECORD NO.:  [id number]          PATIENT TYPE:  INP   LOCATION:  A319                          FACILITY:  [facility]   PHYSICIAN:  Margaretmary Dys, M.D.DATE OF BIRTH:  28-Jun-1960   DATE OF ADMISSION:  09/20/2008  DATE OF DISCHARGE:  LH                              HISTORY & PHYSICAL   ADMISSION DIAGNOSES:  1. Chest pain, rule out myocardial infarction.  2. Severe anemia with thrombocytopenia/borderline normal white blood      cell count.  3. History of hypertension with hypertensive heart disease.  4. History of alcohol abuse, about 6 cans of beer a day, and nicotine      abuse, about 3 packs of cigarettes a day.  5. History of mild mental retardation.   CHIEF COMPLAINT:  Chest pain on the left lower chest wall.   HISTORY OF PRESENT ILLNESS:  Mr. Vandevoort is a 50 year old male who  presented to the emergency room with complaints of left-sided chest  pain.  He describes the pain as sharp and in the left upper chest wall.  He also had some shortness of breath associated with it.  The pain  started abruptly today.  The patient said he was not exerting himself.  He denies any history of trauma or lifting any heavy objects recently.  The patient has no prior history of this pain.  The pain is not  pleuritic.  The patient has no abdominal pain.   He denies any fevers or chills.  No rigors.  The patient's pain was  essentially relieved when he came into the emergency room.  The patient  drove himself to the emergency room.  He denies any dizziness.   Pain is not aggravated by movement or exertion.  Pain only lasted for  only a couple of hours before he came into the emergency room.  On  evaluation in the emergency room, the patient was noted to be slightly  tachycardiac.  His exam was otherwise unremarkable.  His blood count was  noted to be low, hemoglobin was 6.8.  The patient denies any history of  melena stools  or bright red blood in his stool.  He denies any prior  history of hemoptysis or coffee-ground emesis.  He has no hematemesis.  The patient is now being admitted for further evaluation of chest pain  and also severe anemia.  The patient's previous hemoglobin level was  noted to be close to 11 just a few months ago.   REVIEW OF SYSTEMS:  The patient denies any recent weight loss.  His  weight has been about constant for several years.   PAST MEDICAL HISTORY:  1. Hypertension.  2. Depression.  3. History of chronic alcohol/nicotine abuse.  4. Mild mental retardation.   MEDICATIONS:  1. Lisinopril 10 mg p.o. once a day.  2. Multivitamins 1 tablet daily.  3. Aspirin 81 mg p.o. once a day.   The patient denies any use of nonsteroidal anti-inflammatory drugs such  as Motrin or ibuprofen.   ALLERGIES:  He has no known drug allergies.   FAMILY  HISTORY:  Mother died about 3 years ago and that has really  affected him in terms of his depression.  He has 1 sister who lives in  [place].   SOCIAL HISTORY:  The patient lives in a trailer close to [place].  He is pretty much independent of his activities of daily living.  He had  completed grade 9.  He smokes about 3 pack of cigarettes a day and also  drinks about 6 cans of Budweiser a day.   The patient denies any family history of colon cancer or gastric cancer.   PHYSICAL EXAM:  The patient was conscious, alert, comfortable, not in  acute distress.  Was well-oriented in time, place and person, otherwise  slightly slow in his speech.  VITAL SIGNS:  Blood pressure was 124/72 with a pulse of 92, respirations  20, temperature 98.1 degrees Fahrenheit, oxygen saturation was 97% on  room air.  HEENT EXAM:  Normocephalic, atraumatic.  Oral mucosa was dry.  No  petechiae were seen.  no exudates were seen.  NECK:  Supple.  No JVD or lymphadenopathy.  LUNGS:  Clear clinically with good air entry bilaterally.  HEART:  S1, S2, regular.   No S3, S4, gallops or rubs.  ABDOMEN:  Soft, not tender.  Bowel sounds positive.  No masses palpable.  Spleen and liver were not palpable.  EXTREMITIES:  No edema.  No calf induration or tenderness was noted.  CNS EXAM:  The patient was well-oriented in time, place and person.  Pupils were equal and reactive to light.  He had no focal neurological  deficits.   LABORATORY/DIAGNOSTIC DATA:  White blood cell is count 5.0, hemoglobin  of 6.7, hematocrit 20.7, platelet count was 123, MCV was 70.8,  suggestive of iron-deficiency chronic anemia.  Platelet count was  123,000.  Eosinophils were also noted to be 9%.  Sodium was 132,  potassium was 3.2, chloride of 100, CO2 was 26, glucose 111, BUN was 10,  creatinine was 1.0, AST was 22, ALT of 13, alkaline phosphatase was 63,  albumin was 3.9, calcium was 8.9.  Initial cardiac markers were  negative.  A 12-lead EKG shows sinus tachycardia with some left  ventricular hypertrophy, with no acute ST-T changes.  Chest x-ray shows  no cardiomegaly, with no acute infiltrates noted.   ASSESSMENT/PLAN:  Mr. Punch is a 50 year old male who has been  admitted to the hospital with chest pain.  The patient is found to have  severe anemia and some pancytopenia picture as his white blood cell  count, although in the normal range, is in the lower limit of normal.   PLAN:  1. The patient has been admitted to the medical floor with telemetry.  2. We will transfuse with 2 units of packed red blood cells, each unit      over 3 hours.  3. Prior to transfusion, the patient will have anemia workup including      serum iron levels, folate levels, vitamin B12 and ferritin levels.  4. The patient does have a fair amount of alcohol abuse, although his      liver function test was normal.  It is entirely possible that this      could be some erosive gastritis or a duodenal ulcer that has bled      slowly over time.  I will request for a gastroenterology consult       this morning.  5. We will put the patient on Protonix 40 mg p.o.  once a day.  We will      cycle his cardiac enzymes, and it is and entirely possible that he      may have some degree of demand ischemia due to his severe anemia.  6. I will put the patient on sequential compression devices for DVT      prophylaxis for now.  7. The patient is hemodynamically stable, and we will continue with      his lisinopril at this time.  8. We will also request a CT abdomen and pelvis to further evaluate      for any potential intra-abdominal concerns/abnormalities.  9. A blood smear will also be sent for further evaluation.  10.The patient will be placed on nicotine patch, and smoking cessation      discussion was held with the patient.  11.We will also watch closely for any evidence of alcohol withdrawal.   I have discussed the above plan with the patient in detail.  He remains  a full code and he verbalized full understanding.      Margaretmary Dys, M.D.  Electronically Signed     AM/MEDQ  D:  09/21/2008  T:  09/21/2008  Job:  [id number]

## 2010-12-05 LAB — DIFFERENTIAL
Band Neutrophils: 6
Basophils Relative: 0
Blasts: 0
Eosinophils Relative: 0
Lymphocytes Relative: 3 — ABNORMAL LOW
Metamyelocytes Relative: 2
Monocytes Relative: 5
Myelocytes: 3
Neutrophils Relative %: 81 — ABNORMAL HIGH
Promyelocytes Absolute: 0
nRBC: 0

## 2010-12-05 LAB — COMPREHENSIVE METABOLIC PANEL
ALT: 13
AST: 20
Albumin: 3.6
Alkaline Phosphatase: 73
BUN: 28 — ABNORMAL HIGH
CO2: 26
Calcium: 9.2
Chloride: 91 — ABNORMAL LOW
Creatinine, Ser: 1.22
GFR calc Af Amer: >60
GFR calc non Af Amer: >60
Glucose, Bld: 129 — ABNORMAL HIGH
Potassium: 3.6
Sodium: 128 — ABNORMAL LOW
Total Bilirubin: 1
Total Protein: 7.9

## 2010-12-05 LAB — CBC
HCT: 31.4 — ABNORMAL LOW
Hemoglobin: 10.7 — ABNORMAL LOW
MCHC: 34
MCV: 90
Platelets: 191
RBC: 3.49 — ABNORMAL LOW
RDW: 14
WBC: 18.6 — ABNORMAL HIGH

## 2010-12-05 LAB — CULTURE, BLOOD (ROUTINE X 2)
Culture: NO GROWTH
Report Status: 2122009

## 2011-10-22 ENCOUNTER — Other Ambulatory Visit (HOSPITAL_COMMUNITY): Payer: Self-pay | Admitting: *Deleted

## 2011-10-22 DIAGNOSIS — K7030 Alcoholic cirrhosis of liver without ascites: Secondary | ICD-10-CM

## 2011-10-22 DIAGNOSIS — R772 Abnormality of alphafetoprotein: Secondary | ICD-10-CM

## 2011-10-30 ENCOUNTER — Inpatient Hospital Stay (HOSPITAL_COMMUNITY): Admission: RE | Admit: 2011-10-30 | Payer: Self-pay | Source: Ambulatory Visit

## 2011-11-10 LAB — TROPONIN I: Troponin-I: <0.02 ng/mL

## 2011-11-10 LAB — MAGNESIUM: Magnesium: 1.4 mg/dL — ABNORMAL LOW

## 2011-11-10 LAB — DRUG SCREEN, URINE

## 2011-11-10 LAB — CBC WITH DIFFERENTIAL/PLATELET
Basophil #: 0 10*3/uL (ref 0.0–0.1)
Basophil %: 1 %
Eosinophil #: 0.5 10*3/uL (ref 0.0–0.7)
Eosinophil %: 10.4 %
HGB: 11.5 g/dL — ABNORMAL LOW (ref 13.0–18.0)
Lymphocyte #: 1.6 10*3/uL (ref 1.0–3.6)
Lymphocyte %: 29.9 %
Monocyte #: 0.7 x10 3/mm (ref 0.2–1.0)
Monocyte %: 12.5 %
Neutrophil #: 2.4 10*3/uL (ref 1.4–6.5)
Neutrophil %: 46.2 %
Platelet: 78 10*3/uL — ABNORMAL LOW (ref 150–440)
WBC: 5.2 10*3/uL (ref 3.8–10.6)

## 2011-11-10 LAB — URINALYSIS, COMPLETE
Bacteria: NONE SEEN
Bilirubin,UR: NEGATIVE
Blood: NEGATIVE
Glucose,UR: NEGATIVE mg/dL (ref 0–75)
Ketone: NEGATIVE
Leukocyte Esterase: NEGATIVE
Nitrite: NEGATIVE
Ph: 6 (ref 4.5–8.0)
Protein: NEGATIVE
RBC,UR: NONE SEEN /HPF (ref 0–5)
Specific Gravity: 1.003 (ref 1.003–1.030)
Squamous Epithelial: NONE SEEN
WBC UR: <1 /HPF (ref 0–5)

## 2011-11-10 LAB — ETHANOL
Ethanol %: 0.267 % — ABNORMAL HIGH (ref 0.000–0.080)
Ethanol: 267 mg/dL

## 2011-11-10 LAB — COMPREHENSIVE METABOLIC PANEL
Albumin: 3.8 g/dL (ref 3.4–5.0)
Alkaline Phosphatase: 83 U/L (ref 50–136)
Anion Gap: 12 (ref 7–16)
BUN: 18 mg/dL (ref 7–18)
Bilirubin,Total: 0.7 mg/dL (ref 0.2–1.0)
Calcium, Total: 8.4 mg/dL — ABNORMAL LOW (ref 8.5–10.1)
Chloride: 81 mmol/L — ABNORMAL LOW (ref 98–107)
Co2: 22 mmol/L (ref 21–32)
Creatinine: 1.43 mg/dL — ABNORMAL HIGH (ref 0.60–1.30)
EGFR (African American): >60
EGFR (Non-African Amer.): 56 — ABNORMAL LOW
Glucose: 87 mg/dL (ref 65–99)
Osmolality: 234 (ref 275–301)
Potassium: 3.1 mmol/L — ABNORMAL LOW (ref 3.5–5.1)
SGOT(AST): 111 U/L — ABNORMAL HIGH (ref 15–37)
SGPT (ALT): 128 U/L — ABNORMAL HIGH (ref 12–78)
Sodium: 115 mmol/L — CL (ref 136–145)
Total Protein: 7.3 g/dL (ref 6.4–8.2)

## 2011-11-10 LAB — TSH: Thyroid Stimulating Horm: 2.02 u[IU]/mL

## 2011-11-11 ENCOUNTER — Inpatient Hospital Stay: Payer: Self-pay | Admitting: Internal Medicine

## 2011-11-11 LAB — CBC WITH DIFFERENTIAL/PLATELET
Basophil #: 0.1 10*3/uL (ref 0.0–0.1)
Basophil %: 2.6 %
Eosinophil #: 0.6 10*3/uL (ref 0.0–0.7)
Eosinophil %: 12.2 %
HCT: 32 % — ABNORMAL LOW (ref 40.0–52.0)
HGB: 11.5 g/dL — ABNORMAL LOW (ref 13.0–18.0)
Lymphocyte #: 1.2 10*3/uL (ref 1.0–3.6)
Lymphocyte %: 22.6 %
MCH: 35.6 pg — ABNORMAL HIGH (ref 26.0–34.0)
MCHC: 35.9 g/dL (ref 32.0–36.0)
MCV: 99 fL (ref 80–100)
Monocyte #: 0.4 x10 3/mm (ref 0.2–1.0)
Monocyte %: 7.6 %
Neutrophil #: 2.9 10*3/uL (ref 1.4–6.5)
Neutrophil %: 55 %
Platelet: 93 10*3/uL — ABNORMAL LOW (ref 150–440)
RBC: 3.23 10*6/uL — ABNORMAL LOW (ref 4.40–5.90)
RDW: 13.5 % (ref 11.5–14.5)
WBC: 5.3 10*3/uL (ref 3.8–10.6)

## 2011-11-11 LAB — URIC ACID: Uric Acid: 6.1 mg/dL (ref 3.5–7.2)

## 2011-11-11 LAB — BASIC METABOLIC PANEL
Anion Gap: 14 (ref 7–16)
Anion Gap: 11 (ref 7–16)
Anion Gap: 8 (ref 7–16)
BUN: 15 mg/dL (ref 7–18)
BUN: 13 mg/dL (ref 7–18)
BUN: 15 mg/dL (ref 7–18)
Calcium, Total: 8 mg/dL — ABNORMAL LOW (ref 8.5–10.1)
Calcium, Total: 8.3 mg/dL — ABNORMAL LOW (ref 8.5–10.1)
Calcium, Total: 8.5 mg/dL (ref 8.5–10.1)
Chloride: 87 mmol/L — ABNORMAL LOW (ref 98–107)
Chloride: 93 mmol/L — ABNORMAL LOW (ref 98–107)
Chloride: 100 mmol/L (ref 98–107)
Co2: 21 mmol/L (ref 21–32)
Co2: 23 mmol/L (ref 21–32)
Co2: 25 mmol/L (ref 21–32)
Creatinine: 1.11 mg/dL (ref 0.60–1.30)
Creatinine: 1.13 mg/dL (ref 0.60–1.30)
Creatinine: 1.16 mg/dL (ref 0.60–1.30)
EGFR (African American): >60
EGFR (African American): >60
EGFR (African American): >60
EGFR (Non-African Amer.): >60
EGFR (Non-African Amer.): >60
EGFR (Non-African Amer.): >60
Glucose: 102 mg/dL — ABNORMAL HIGH (ref 65–99)
Glucose: 178 mg/dL — ABNORMAL HIGH (ref 65–99)
Glucose: 164 mg/dL — ABNORMAL HIGH (ref 65–99)
Osmolality: 247 (ref 275–301)
Osmolality: 260 (ref 275–301)
Osmolality: 271 (ref 275–301)
Potassium: 3.2 mmol/L — ABNORMAL LOW (ref 3.5–5.1)
Potassium: 4 mmol/L (ref 3.5–5.1)
Potassium: 3.6 mmol/L (ref 3.5–5.1)
Sodium: 122 mmol/L — ABNORMAL LOW (ref 136–145)
Sodium: 127 mmol/L — ABNORMAL LOW (ref 136–145)
Sodium: 133 mmol/L — ABNORMAL LOW (ref 136–145)

## 2011-11-11 LAB — PROTIME-INR
INR: 1
Prothrombin Time: 13.3 secs (ref 11.5–14.7)

## 2011-11-11 LAB — OSMOLALITY, URINE: Osmolality: 191 mOsm/kg

## 2011-11-11 LAB — CREATININE, URINE, RANDOM: Creatinine, Urine Random: 25.7 mg/dL — ABNORMAL LOW (ref 30.0–125.0)

## 2011-11-11 LAB — SODIUM, URINE, RANDOM: Sodium, Urine Random: 33 mmol/L (ref 20–110)

## 2011-11-12 LAB — CBC WITH DIFFERENTIAL/PLATELET
Basophil #: 0 10*3/uL (ref 0.0–0.1)
Basophil %: 1.2 %
Eosinophil #: 0.1 10*3/uL (ref 0.0–0.7)
Eosinophil %: 3.8 %
HCT: 27.7 % — ABNORMAL LOW (ref 40.0–52.0)
HGB: 10.1 g/dL — ABNORMAL LOW (ref 13.0–18.0)
Lymphocyte #: 0.7 10*3/uL — ABNORMAL LOW (ref 1.0–3.6)
Lymphocyte %: 25.2 %
MCH: 37 pg — ABNORMAL HIGH (ref 26.0–34.0)
MCHC: 36.4 g/dL — ABNORMAL HIGH (ref 32.0–36.0)
MCV: 102 fL — ABNORMAL HIGH (ref 80–100)
Monocyte #: 0.4 x10 3/mm (ref 0.2–1.0)
Monocyte %: 13.5 %
Neutrophil #: 1.6 10*3/uL (ref 1.4–6.5)
Neutrophil %: 56.3 %
Platelet: 63 10*3/uL — ABNORMAL LOW (ref 150–440)
RBC: 2.72 10*6/uL — ABNORMAL LOW (ref 4.40–5.90)
RDW: 14.3 % (ref 11.5–14.5)
WBC: 2.9 10*3/uL — ABNORMAL LOW (ref 3.8–10.6)

## 2011-11-12 LAB — BASIC METABOLIC PANEL
Anion Gap: 7 (ref 7–16)
BUN: 12 mg/dL (ref 7–18)
Calcium, Total: 8.4 mg/dL — ABNORMAL LOW (ref 8.5–10.1)
Chloride: 101 mmol/L (ref 98–107)
Co2: 28 mmol/L (ref 21–32)
Creatinine: 0.86 mg/dL (ref 0.60–1.30)
EGFR (African American): >60
EGFR (Non-African Amer.): >60
Glucose: 93 mg/dL (ref 65–99)
Osmolality: 271 (ref 275–301)
Potassium: 3.5 mmol/L (ref 3.5–5.1)
Sodium: 136 mmol/L (ref 136–145)

## 2011-11-12 LAB — MAGNESIUM: Magnesium: 1.8 mg/dL

## 2011-11-13 LAB — BASIC METABOLIC PANEL
Anion Gap: 6 — ABNORMAL LOW (ref 7–16)
BUN: 10 mg/dL (ref 7–18)
Calcium, Total: 9 mg/dL (ref 8.5–10.1)
Chloride: 100 mmol/L (ref 98–107)
Co2: 30 mmol/L (ref 21–32)
Creatinine: 0.89 mg/dL (ref 0.60–1.30)
EGFR (African American): >60
EGFR (Non-African Amer.): >60
Glucose: 93 mg/dL (ref 65–99)
Osmolality: 271 (ref 275–301)
Potassium: 3.7 mmol/L (ref 3.5–5.1)
Sodium: 136 mmol/L (ref 136–145)

## 2011-11-17 LAB — CULTURE, BLOOD (SINGLE)

## 2011-11-26 IMAGING — US US SCROTUM
1 series · 14 of 25 positions shown · non-contrast
Comparison: None.

CLINICAL DATA: Scrotal swelling.

SCROTAL ULTRASOUND
DOPPLER ULTRASOUND OF THE TESTICLES
TECHNIQUE: Complete ultrasound examination of the testicles,
epididymis, and other scrotal structures was performed.  Color and
spectral Doppler ultrasound were also utilized to evaluate blood
flow to the testicles.

[Series 1: us scrotum · 0.13mm/px · 14 of 54 slices shown]
[im 1/54]
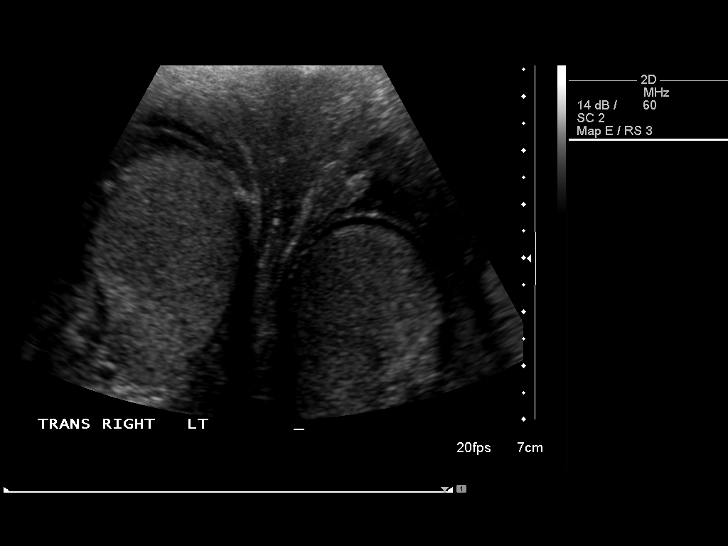
[im 5/54]
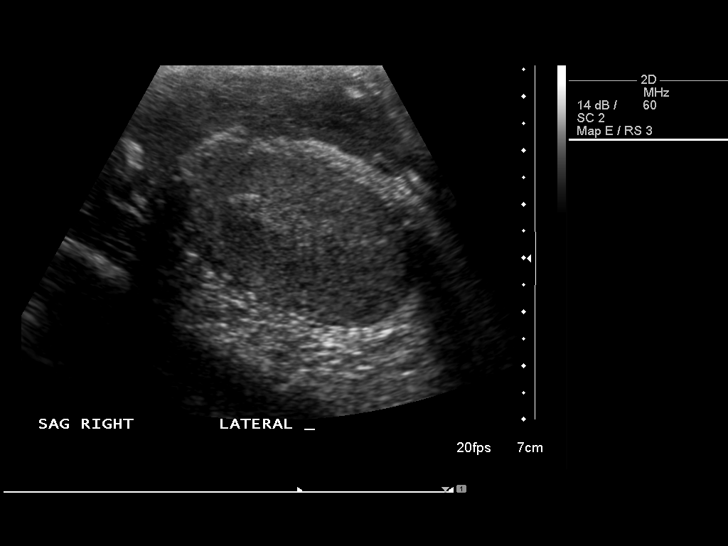
[im 9/54]
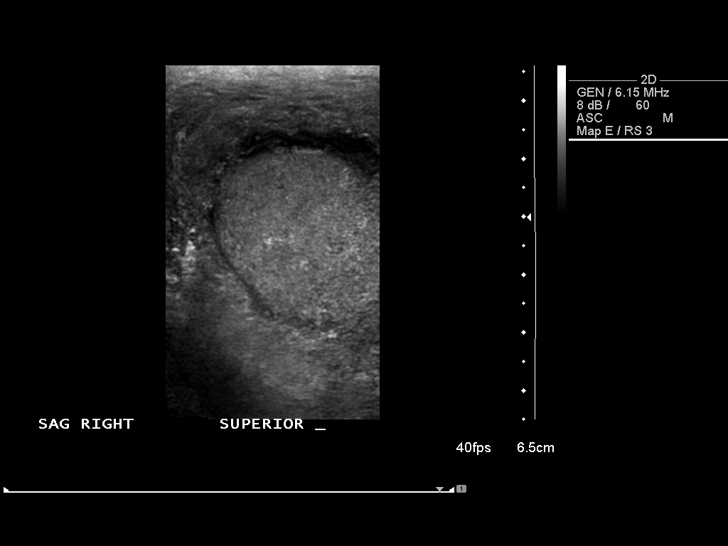
[im 14/54]
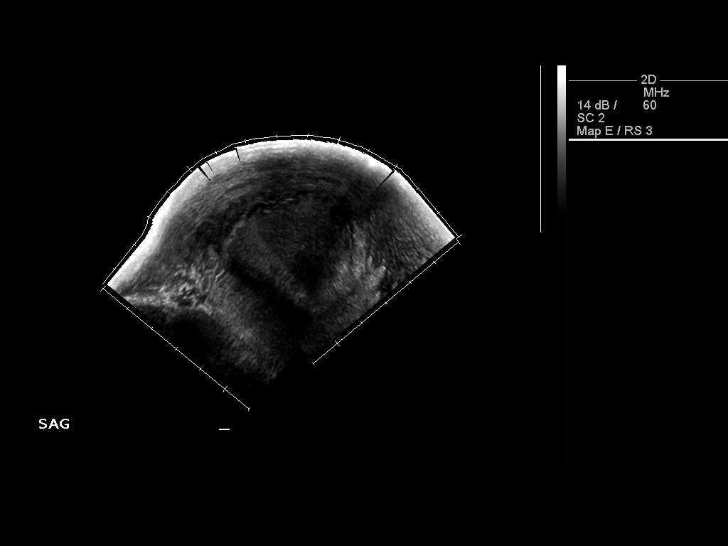
[im 18/54]
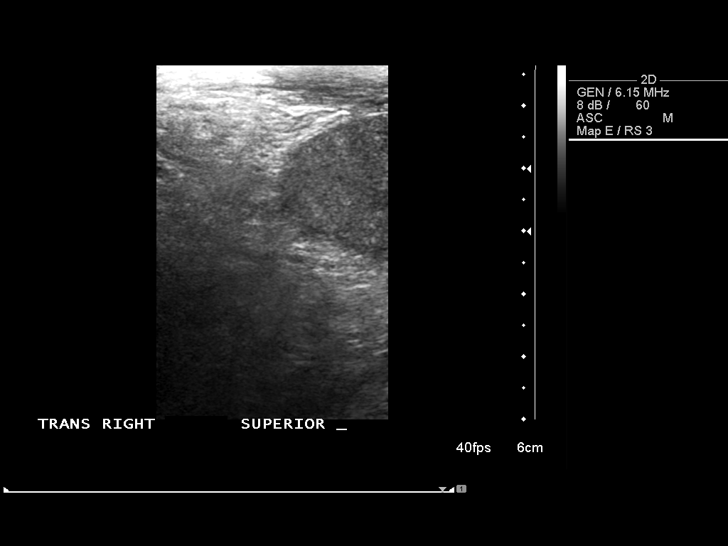
[im 20/54]
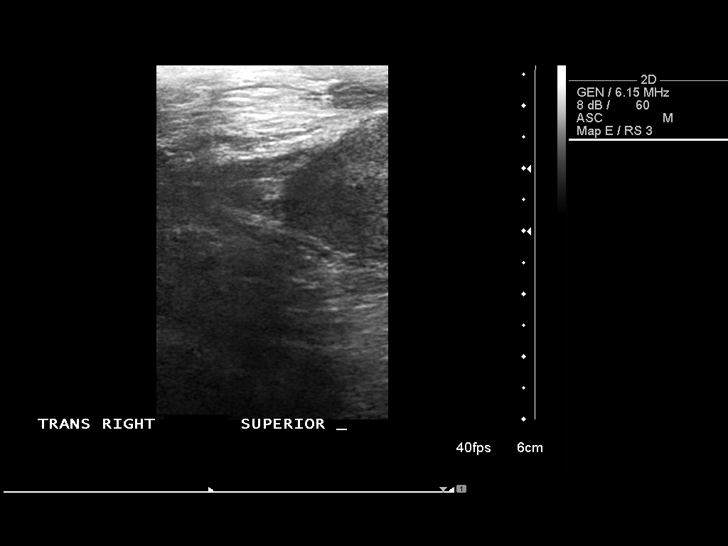
[im 25/54]
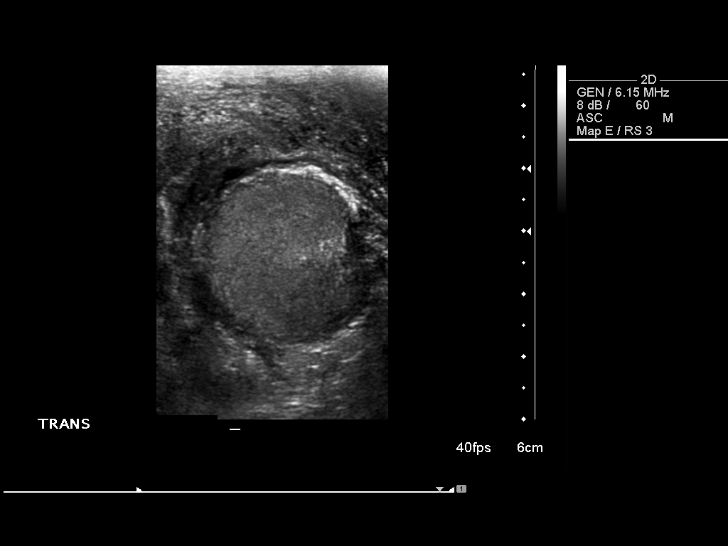
[im 29/54]
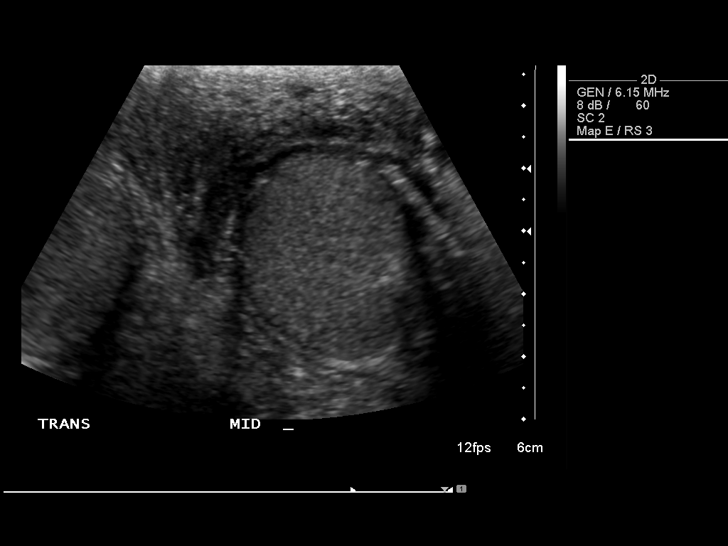
[im 34/54]
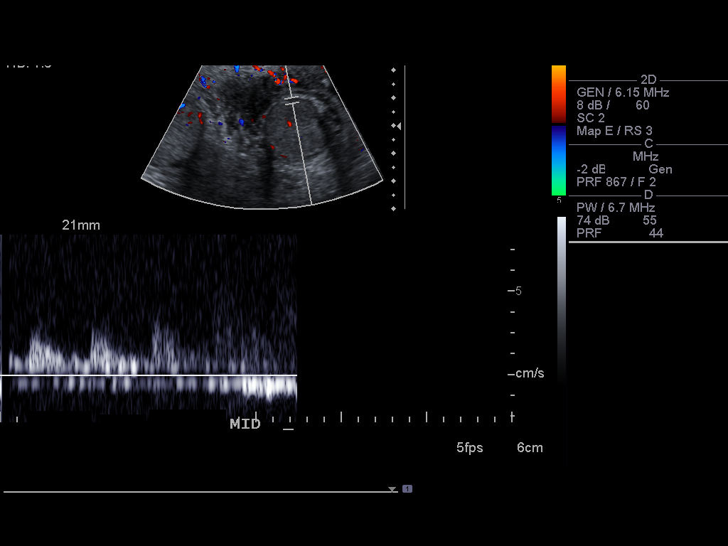
[im 36/54]
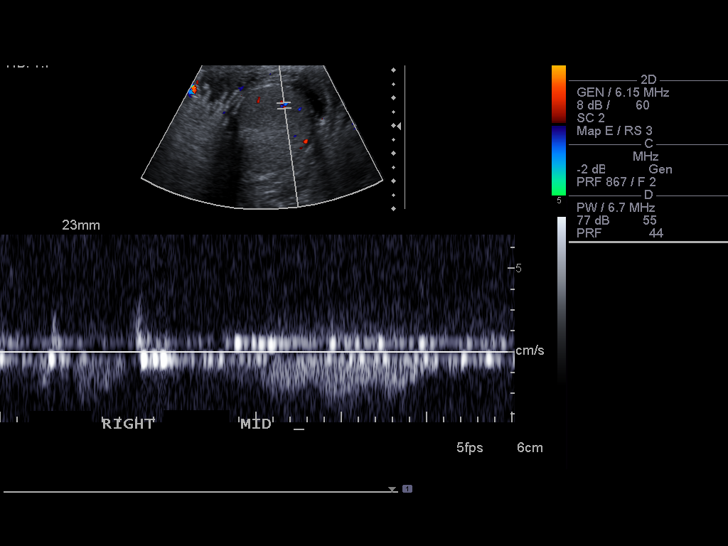
[im 40/54]
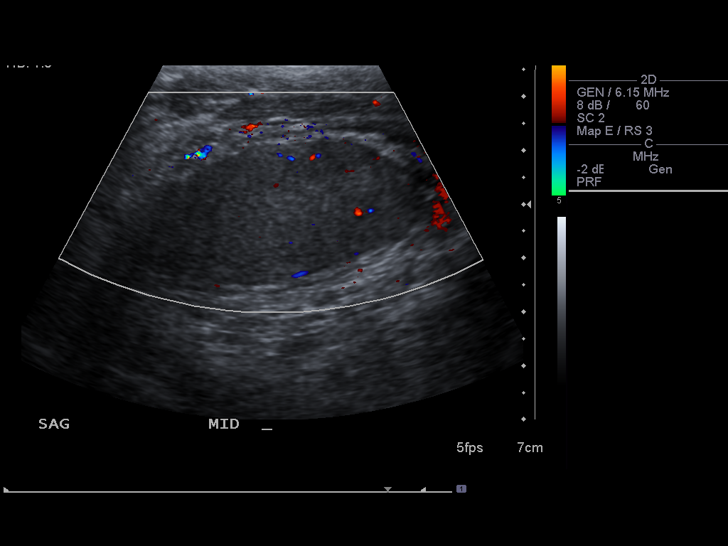
[im 45/54]
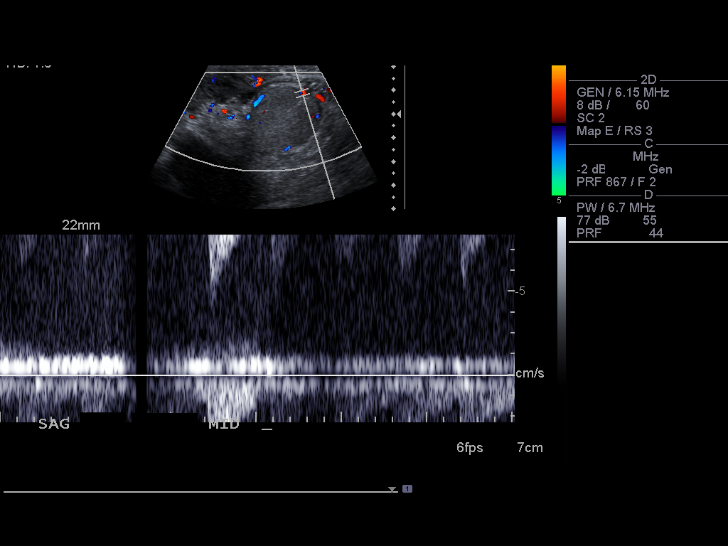
[im 49/54]
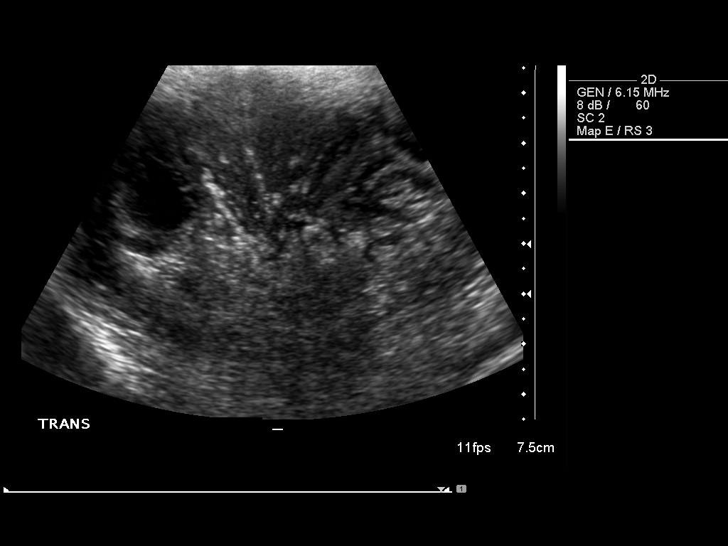
[im 54/54]
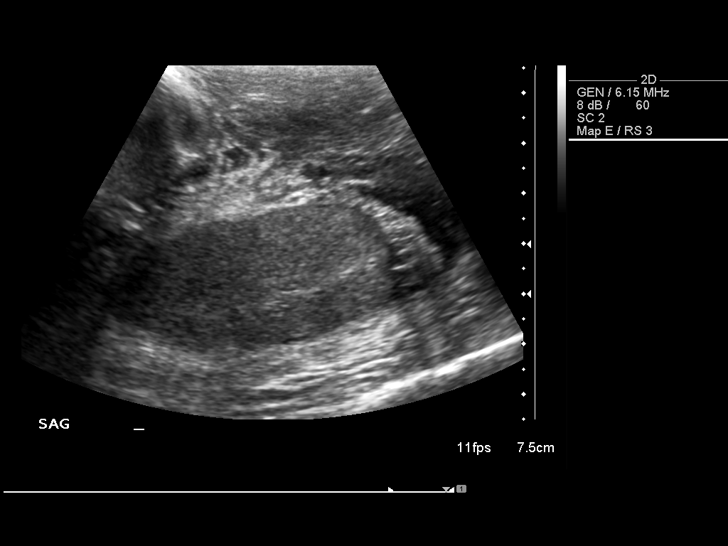

[14 of 25 positions shown; findings below may reference images not displayed]

FINDINGS: The right testicle appears normal and measures 4.9 x
x 2.7 cm.

The left testicle appears normal and measures 5.5 x 2.6 x 2.9 cm.

The epididymi are normal.

There is a tiny amount of fluid around both testicles with no
significant hydrocele.

There are no varicoceles.

There is marked edema of the scrotum.

Color Doppler analysis demonstrates normal arterial and venous
waveforms with normal perfusion to both testicles.
IMPRESSION: Severe edema of the scrotum.  Normal appearing testicles with
normal perfusion.

## 2012-06-17 ENCOUNTER — Inpatient Hospital Stay (HOSPITAL_COMMUNITY)
Admission: EM | Admit: 2012-06-17 | Discharge: 2012-06-23 | DRG: 897 | Disposition: A | Discharge status: Skilled Nursing Facility | Payer: Medicare Other | Attending: Internal Medicine | Admitting: Internal Medicine

## 2012-06-17 ENCOUNTER — Encounter (HOSPITAL_COMMUNITY): Payer: Self-pay | Admitting: *Deleted

## 2012-06-17 ENCOUNTER — Emergency Department (HOSPITAL_COMMUNITY): Payer: Medicare Other

## 2012-06-17 DIAGNOSIS — E872 Acidosis, unspecified: Secondary | ICD-10-CM | POA: Diagnosis present

## 2012-06-17 DIAGNOSIS — D696 Thrombocytopenia, unspecified: Secondary | ICD-10-CM | POA: Diagnosis present

## 2012-06-17 DIAGNOSIS — K7030 Alcoholic cirrhosis of liver without ascites: Secondary | ICD-10-CM | POA: Diagnosis present

## 2012-06-17 DIAGNOSIS — S300XXA Contusion of lower back and pelvis, initial encounter: Secondary | ICD-10-CM | POA: Diagnosis present

## 2012-06-17 DIAGNOSIS — R296 Repeated falls: Secondary | ICD-10-CM

## 2012-06-17 DIAGNOSIS — R000 Tachycardia, unspecified: Secondary | ICD-10-CM | POA: Diagnosis present

## 2012-06-17 DIAGNOSIS — F10231 Alcohol dependence with withdrawal delirium: Secondary | ICD-10-CM

## 2012-06-17 DIAGNOSIS — F1020 Alcohol dependence, uncomplicated: Secondary | ICD-10-CM | POA: Diagnosis present

## 2012-06-17 DIAGNOSIS — W19XXXA Unspecified fall, initial encounter: Secondary | ICD-10-CM | POA: Diagnosis present

## 2012-06-17 DIAGNOSIS — S20219A Contusion of unspecified front wall of thorax, initial encounter: Secondary | ICD-10-CM | POA: Diagnosis present

## 2012-06-17 DIAGNOSIS — I959 Hypotension, unspecified: Secondary | ICD-10-CM | POA: Diagnosis present

## 2012-06-17 DIAGNOSIS — R6250 Unspecified lack of expected normal physiological development in childhood: Secondary | ICD-10-CM | POA: Diagnosis present

## 2012-06-17 DIAGNOSIS — R197 Diarrhea, unspecified: Secondary | ICD-10-CM | POA: Diagnosis present

## 2012-06-17 DIAGNOSIS — R945 Abnormal results of liver function studies: Secondary | ICD-10-CM

## 2012-06-17 DIAGNOSIS — F1010 Alcohol abuse, uncomplicated: Secondary | ICD-10-CM | POA: Diagnosis present

## 2012-06-17 DIAGNOSIS — E860 Dehydration: Secondary | ICD-10-CM | POA: Diagnosis present

## 2012-06-17 DIAGNOSIS — R7989 Other specified abnormal findings of blood chemistry: Secondary | ICD-10-CM | POA: Diagnosis present

## 2012-06-17 DIAGNOSIS — F10931 Alcohol use, unspecified with withdrawal delirium: Principal | ICD-10-CM | POA: Diagnosis present

## 2012-06-17 DIAGNOSIS — S2220XA Unspecified fracture of sternum, initial encounter for closed fracture: Secondary | ICD-10-CM

## 2012-06-17 DIAGNOSIS — E876 Hypokalemia: Secondary | ICD-10-CM | POA: Diagnosis present

## 2012-06-17 DIAGNOSIS — E878 Other disorders of electrolyte and fluid balance, not elsewhere classified: Secondary | ICD-10-CM | POA: Diagnosis present

## 2012-06-17 DIAGNOSIS — K7460 Unspecified cirrhosis of liver: Secondary | ICD-10-CM | POA: Diagnosis present

## 2012-06-17 DIAGNOSIS — D6959 Other secondary thrombocytopenia: Secondary | ICD-10-CM | POA: Diagnosis present

## 2012-06-17 DIAGNOSIS — E871 Hypo-osmolality and hyponatremia: Secondary | ICD-10-CM | POA: Diagnosis present

## 2012-06-17 DIAGNOSIS — E8729 Other acidosis: Secondary | ICD-10-CM | POA: Diagnosis present

## 2012-06-17 HISTORY — DX: Essential (primary) hypertension: I10

## 2012-06-17 LAB — COMPREHENSIVE METABOLIC PANEL
ALT: 85 U/L — ABNORMAL HIGH (ref 0–53)
AST: 190 U/L — ABNORMAL HIGH (ref 0–37)
Albumin: 4 g/dL (ref 3.5–5.2)
Alkaline Phosphatase: 94 U/L (ref 39–117)
BUN: 7 mg/dL (ref 6–23)
CO2: 20 mEq/L (ref 19–32)
Calcium: 9.2 mg/dL (ref 8.4–10.5)
Chloride: 81 mEq/L — ABNORMAL LOW (ref 96–112)
Creatinine, Ser: 0.6 mg/dL (ref 0.50–1.35)
GFR calc Af Amer: >90 mL/min (ref >90)
GFR calc non Af Amer: >90 mL/min (ref >90)
Glucose, Bld: 86 mg/dL (ref 70–99)
Potassium: 3.2 mEq/L — ABNORMAL LOW (ref 3.5–5.1)
Sodium: 124 mEq/L — ABNORMAL LOW (ref 135–145)
Total Bilirubin: 3.3 mg/dL — ABNORMAL HIGH (ref 0.3–1.2)
Total Protein: 8.1 g/dL (ref 6.0–8.3)

## 2012-06-17 LAB — URINALYSIS, ROUTINE W REFLEX MICROSCOPIC
Bilirubin Urine: NEGATIVE
Glucose, UA: NEGATIVE mg/dL
Ketones, ur: 15 mg/dL — AB
Leukocytes, UA: NEGATIVE
Nitrite: NEGATIVE
Protein, ur: NEGATIVE mg/dL
Specific Gravity, Urine: <1.005 — ABNORMAL LOW (ref 1.005–1.030)
Urobilinogen, UA: 0.2 mg/dL (ref 0.0–1.0)
pH: 6.5 (ref 5.0–8.0)

## 2012-06-17 LAB — CBC WITH DIFFERENTIAL/PLATELET
Basophils Absolute: 0 10*3/uL (ref 0.0–0.1)
Basophils Relative: 0 % (ref 0–1)
Eosinophils Absolute: 0 10*3/uL (ref 0.0–0.7)
Eosinophils Relative: 0 % (ref 0–5)
HCT: 36.1 % — ABNORMAL LOW (ref 39.0–52.0)
Hemoglobin: 13.1 g/dL (ref 13.0–17.0)
Lymphocytes Relative: 11 % — ABNORMAL LOW (ref 12–46)
Lymphs Abs: 0.6 10*3/uL — ABNORMAL LOW (ref 0.7–4.0)
MCH: 34.3 pg — ABNORMAL HIGH (ref 26.0–34.0)
MCHC: 36.3 g/dL — ABNORMAL HIGH (ref 30.0–36.0)
MCV: 94.5 fL (ref 78.0–100.0)
Monocytes Absolute: 0.9 10*3/uL (ref 0.1–1.0)
Monocytes Relative: 18 % — ABNORMAL HIGH (ref 3–12)
Neutro Abs: 3.4 10*3/uL (ref 1.7–7.7)
Neutrophils Relative %: 70 % (ref 43–77)
Platelets: 14 10*3/uL — CL (ref 150–400)
RBC: 3.82 MIL/uL — ABNORMAL LOW (ref 4.22–5.81)
RDW: 13.1 % (ref 11.5–15.5)
WBC: 4.9 10*3/uL (ref 4.0–10.5)

## 2012-06-17 LAB — PROTIME-INR
INR: 1.14 (ref 0.00–1.49)
Prothrombin Time: 14.4 seconds (ref 11.6–15.2)

## 2012-06-17 LAB — URINE MICROSCOPIC-ADD ON

## 2012-06-17 LAB — ETHANOL: Alcohol, Ethyl (B): 106 mg/dL — ABNORMAL HIGH (ref 0–11)

## 2012-06-17 LAB — MAGNESIUM: Magnesium: 1.8 mg/dL (ref 1.5–2.5)

## 2012-06-17 LAB — PRO B NATRIURETIC PEPTIDE: Pro B Natriuretic peptide (BNP): 1470 pg/mL — ABNORMAL HIGH (ref 0–125)

## 2012-06-17 MED ORDER — SODIUM CHLORIDE 0.9 % IV BOLUS (SEPSIS)
1000.0000 mL | Freq: Once | INTRAVENOUS | Status: AC
  Administered 2012-06-17: 1000 mL via INTRAVENOUS

## 2012-06-17 MED ORDER — THIAMINE HCL 100 MG/ML IJ SOLN
Freq: Once | INTRAVENOUS | Status: AC
  Administered 2012-06-18: via INTRAVENOUS
  Filled 2012-06-17: qty 1000

## 2012-06-17 MED ORDER — ONDANSETRON HCL 4 MG PO TABS: 4.0000 mg | ORAL_TABLET | Freq: Four times a day (QID) | ORAL | Status: DC | PRN

## 2012-06-17 MED ORDER — IOHEXOL 300 MG/ML  SOLN
100.0000 mL | Freq: Once | INTRAMUSCULAR | Status: AC | PRN
  Administered 2012-06-17: 100 mL via INTRAVENOUS

## 2012-06-17 MED ORDER — LORAZEPAM 2 MG/ML IJ SOLN
1.0000 mg | Freq: Once | INTRAMUSCULAR | Status: AC
  Administered 2012-06-17: 1 mg via INTRAVENOUS
  Filled 2012-06-17: qty 1

## 2012-06-17 MED ORDER — LORAZEPAM 1 MG PO TABS: 1.0000 mg | ORAL_TABLET | Freq: Four times a day (QID) | ORAL | Status: AC | PRN

## 2012-06-17 MED ORDER — SORBITOL 70 % SOLN: 30.0000 mL | Freq: Every day | Status: DC | PRN

## 2012-06-17 MED ORDER — LORAZEPAM 2 MG/ML IJ SOLN
1.0000 mg | Freq: Four times a day (QID) | INTRAMUSCULAR | Status: AC | PRN
  Administered 2012-06-18 – 2012-06-20 (×2): 1 mg via INTRAVENOUS
  Filled 2012-06-17 (×3): qty 1

## 2012-06-17 MED ORDER — VITAMIN B-1 100 MG PO TABS
100.0000 mg | ORAL_TABLET | Freq: Every day | ORAL | Status: DC
  Administered 2012-06-18 – 2012-06-23 (×6): 100 mg via ORAL
  Filled 2012-06-17 (×6): qty 1

## 2012-06-17 MED ORDER — MORPHINE SULFATE 2 MG/ML IJ SOLN
2.0000 mg | INTRAMUSCULAR | Status: DC | PRN
  Administered 2012-06-18 – 2012-06-19 (×2): 2 mg via INTRAVENOUS
  Filled 2012-06-17 (×2): qty 1

## 2012-06-17 MED ORDER — POTASSIUM CHLORIDE 10 MEQ/100ML IV SOLN
10.0000 meq | INTRAVENOUS | Status: AC
  Administered 2012-06-17 – 2012-06-18 (×3): 10 meq via INTRAVENOUS
  Filled 2012-06-17: qty 300

## 2012-06-17 MED ORDER — ADULT MULTIVITAMIN W/MINERALS CH
1.0000 | ORAL_TABLET | Freq: Every day | ORAL | Status: DC
  Administered 2012-06-18 – 2012-06-23 (×6): 1 via ORAL
  Filled 2012-06-17 (×6): qty 1

## 2012-06-17 MED ORDER — THIAMINE HCL 100 MG/ML IJ SOLN: 100.0000 mg | Freq: Every day | INTRAMUSCULAR | Status: DC

## 2012-06-17 MED ORDER — ALBUTEROL SULFATE (5 MG/ML) 0.5% IN NEBU: 2.5000 mg | INHALATION_SOLUTION | RESPIRATORY_TRACT | Status: DC | PRN

## 2012-06-17 MED ORDER — SODIUM CHLORIDE 0.9 % IJ SOLN
3.0000 mL | Freq: Two times a day (BID) | INTRAMUSCULAR | Status: DC
  Administered 2012-06-18 – 2012-06-21 (×5): 3 mL via INTRAVENOUS

## 2012-06-17 MED ORDER — DEXTROSE-NACL 5-0.9 % IV SOLN
INTRAVENOUS | Status: DC
  Administered 2012-06-17 – 2012-06-19 (×5): via INTRAVENOUS
  Administered 2012-06-20: 50 mL via INTRAVENOUS

## 2012-06-17 MED ORDER — LORAZEPAM 2 MG/ML IJ SOLN
0.0000 mg | Freq: Two times a day (BID) | INTRAMUSCULAR | Status: AC
  Administered 2012-06-19: 4 mg via INTRAVENOUS
  Administered 2012-06-20 – 2012-06-21 (×3): 2 mg via INTRAVENOUS
  Filled 2012-06-17: qty 1
  Filled 2012-06-17: qty 2
  Filled 2012-06-17: qty 1

## 2012-06-17 MED ORDER — LORAZEPAM 2 MG/ML IJ SOLN
0.0000 mg | Freq: Four times a day (QID) | INTRAMUSCULAR | Status: AC
  Administered 2012-06-17 – 2012-06-18 (×3): 4 mg via INTRAVENOUS
  Administered 2012-06-19 (×2): 2 mg via INTRAVENOUS
  Filled 2012-06-17 (×2): qty 2
  Filled 2012-06-17: qty 1
  Filled 2012-06-17 (×2): qty 2
  Filled 2012-06-17 (×2): qty 1

## 2012-06-17 MED ORDER — ONDANSETRON HCL 4 MG/2ML IJ SOLN: 4.0000 mg | Freq: Four times a day (QID) | INTRAMUSCULAR | Status: DC | PRN

## 2012-06-17 MED ORDER — ALUM & MAG HYDROXIDE-SIMETH 200-200-20 MG/5ML PO SUSP: 30.0000 mL | Freq: Four times a day (QID) | ORAL | Status: DC | PRN

## 2012-06-17 MED ORDER — FOLIC ACID 1 MG PO TABS
1.0000 mg | ORAL_TABLET | Freq: Every day | ORAL | Status: DC
  Administered 2012-06-18 – 2012-06-23 (×6): 1 mg via ORAL
  Filled 2012-06-17 (×6): qty 1

## 2012-06-17 NOTE — ED Notes (Addendum)
Diarrhea, fall 2 days ago, feels dizzy, alert, talking,  [facility].  When getting from w/c to bed needed a lot of assistance , legs shakey and unable to hold his wt.

## 2012-06-17 NOTE — ED Provider Notes (Signed)
History    This chart was scribed for William Lennert, MD by Gerlean Ren, ED Scribe. This patient was seen in room APA19/APA19 and the patient's care was started at 6:39 PM    CSN: [id number]  Arrival date & time 06/17/12  1821   First MD Initiated Contact with Patient 06/17/12 1836      Chief Complaint  Patient presents with  . Fall     The history is provided by the patient and a relative. No language interpreter was used.  William Barrett is a 52 y.o. male who presents to the Emergency Department complaining of 2 days of diarrhea with associated emesis and decreased appetite.  Family member reports no dramatic weight losses.  No abdominal pain.  Family member reports pt has been falling very frequently.  Pt does not take any blood thinners.  Family member reports pt consumes roughly 18 beers daily  Past Medical History  Diagnosis Date  . Hypertension     History reviewed. No pertinent past surgical history.  History reviewed. No pertinent family history.  History  Substance Use Topics  . Smoking status: Current Every Day Smoker  . Smokeless tobacco: Not on file  . Alcohol Use: Yes      Review of Systems  Constitutional: Positive for appetite change. Negative for fatigue.  HENT: Negative for congestion, sinus pressure and ear discharge.   Eyes: Negative for discharge.  Respiratory: Negative for cough.   Cardiovascular: Negative for chest pain.  Gastrointestinal: Positive for vomiting and diarrhea.  Genitourinary: Negative for frequency and hematuria.  Musculoskeletal: Negative for back pain.  Skin: Negative for rash.  Neurological: Negative for seizures and headaches.  Psychiatric/Behavioral: Negative for hallucinations.    Allergies  Review of patient's allergies indicates no known allergies.  Home Medications  No current outpatient prescriptions on file.  BP 123/87  Pulse 111  Temp(Src) 99.1 F (37.3 C) (Oral)  Resp 20  Ht 5\' 6"  (1.676 m)  Wt 162 lb  (73.483 kg)  BMI 26.16 kg/m2  SpO2 97%  Physical Exam  Nursing note and vitals reviewed. Constitutional: He is oriented to person, place, and time. He appears well-developed.  HENT:  Head: Normocephalic and atraumatic.  Eyes: Conjunctivae and EOM are normal. No scleral icterus.  Neck: Neck supple. No thyromegaly present.  Cardiovascular: Normal rate and regular rhythm.  Exam reveals no gallop and no friction rub.   No murmur heard. Pulmonary/Chest: No stridor. He has no wheezes. He has no rales.  Significant bruising to left and right chest  Abdominal: He exhibits no distension. There is tenderness. There is no rebound.  Significant bruising to left flank and LUQ.  Musculoskeletal: Normal range of motion. He exhibits no edema.  Lymphadenopathy:    He has no cervical adenopathy.  Neurological: He is oriented to person, place, and time. Coordination normal.  Skin: No rash noted. No erythema.  Psychiatric: He has a normal mood and affect. His behavior is normal.    ED Course  Procedures (including critical care time) DIAGNOSTIC STUDIES: Oxygen Saturation is 97% on room air, adequate by my interpretation.    COORDINATION OF CARE: 6:56 PM- Patient informed of clinical course, understands medical decision-making process, and agrees with plan.  Labs Reviewed - No data to display No results found.   No diagnosis found.    Date: 06/17/2012  Rate: 106  Rhythm: sinus tachycardia  QRS Axis: normal  Intervals: normal  ST/T Wave abnormalities: normal  Conduction Disutrbances:none  Narrative  Interpretation:   Old EKG Reviewed: none available  CRITICAL CARE Performed by: Kemyah Buser L   Total critical care time: 35  Critical care time was exclusive of separately billable procedures and treating other patients.  Critical care was necessary to treat or prevent imminent or life-threatening deterioration.  Critical care was time spent personally by me on the following  activities: development of treatment plan with patient and/or surrogate as well as nursing, discussions with consultants, evaluation of patient's response to treatment, examination of patient, obtaining history from patient or surrogate, ordering and performing treatments and interventions, ordering and review of laboratory studies, ordering and review of radiographic studies, pulse oximetry and re-evaluation of patient's condition.  MDM       The chart was scribed for me under my direct supervision.  I personally performed the history, physical, and medical decision making and all procedures in the evaluation of this patient.William Lennert, MD 06/17/12 2149

## 2012-06-17 NOTE — ED Notes (Signed)
CRITICAL VALUE ALERT  Critical value received:  PLT 14  Date of notification:  06/17/12  Time of notification:  1936  Critical value read back:yes  Nurse who received alert:  Lawernce Ion  MD notified (1st page):  JZ  Time of first page:  1937  MD notified (2nd page):  Time of second page:  Responding MD:  JZ  Time MD responded:  [phone]

## 2012-06-17 NOTE — H&P (Signed)
[facility] History and Physical  William Barrett:[id number] DOB: 1960/12/17 DOA: 06/17/2012  Referring physician: EDP Zammit PCP: Eula Listen, MD  Specialists: none  Chief Complaint: Diarrhea, weakness  HPI: William Barrett is a 52 y.o. male with hypertension and severe chronic alcohol abuse presented to the emergency department complaining of 2-3 days of diarrhea, vomiting and decreased appetite. Family report that he has been weaker and has had multiple falls. His last drink of alcohol was the evening prior to admission. His BAC on arrival was 106, UDS negative. He drinks >18 beers a day and has done this daily for over 30 years. Labs obtained in ED suggest dehydration and acidosis and he had profound orthostatic hypotension, so admission was requested for evaluation and management.   Review of Systems: Unable to obtain, patient confused, answers questions unreliably.  Past Medical History  Diagnosis Date  . Hypertension    History reviewed. No pertinent past surgical history. Social History:  reports that he has been smoking.  He does not have any smokeless tobacco history on file. He reports that  drinks alcohol. He reports that he does not use illicit drugs. Lives with several family members.  No Known Allergies  History reviewed. No pertinent family history. unable to obtain  Prior to Admission medications   Not on File   Physical Exam: Filed Vitals:   06/17/12 2130 06/17/12 2200 06/17/12 2215 06/17/12 2230  BP: 108/64 120/69  127/80  Pulse: 101 103 107   Temp:      TempSrc:      Resp:      Height:      Weight:      SpO2: 96% 93%  95%     General:  Disheveled, poor hygiene, diaphoretic, disoriented, delayed responses, word finding issues, tremulous  Eyes: conjunctiva injected  ENT: normal, poor dentition  Neck: supple  Cardiovascular: tachycardic  Respiratory: scattered wheezing  Abdomen: soft, mild ttp over liver/ruq, not  distended  Skin: dry, soiled body, multiple bruises and contusions in various stages of healing.  Musculoskeletal: moves all 4 extremities  Psychiatric: Appears in moderate distress, tremulous, no hallucinations or signs of severe depression  Neurologic: Nonfocal other than global tremor, gait instability, difficulty with proprioception, testing obscured by intoxication.  Labs on Admission:  Basic Metabolic Panel:  Recent Labs Lab 06/17/12 1900  NA 124*  K 3.2*  CL 81*  CO2 20  GLUCOSE 86  BUN 7  CREATININE 0.60  CALCIUM 9.2   Liver Function Tests:  Recent Labs Lab 06/17/12 1900  AST 190*  ALT 85*  ALKPHOS 94  BILITOT 3.3*  PROT 8.1  ALBUMIN 4.0   No results found for this basename: LIPASE, AMYLASE,  in the last 168 hours No results found for this basename: AMMONIA,  in the last 168 hours CBC:  Recent Labs Lab 06/17/12 1900  WBC 4.9  NEUTROABS 3.4  HGB 13.1  HCT 36.1*  MCV 94.5  PLT 14*   Cardiac Enzymes: No results found for this basename: CKTOTAL, CKMB, CKMBINDEX, TROPONINI,  in the last 168 hours  BNP (last 3 results) No results found for this basename: PROBNP,  in the last 8760 hours CBG: No results found for this basename: GLUCAP,  in the last 168 hours  Radiological Exams on Admission: Ct Head Wo Contrast  06/17/2012  *RADIOLOGY REPORT*  Clinical Data:  Trauma.  Larey Seat off porch 2 days ago.  Multiple areas of bruising.  CT HEAD WITHOUT CONTRAST CT CERVICAL SPINE  WITHOUT CONTRAST  Technique:  Multidetector CT imaging of the head and cervical spine was performed following the standard protocol without intravenous contrast.  Multiplanar CT image reconstructions of the cervical spine were also generated.  Comparison:   None  CT HEAD  Findings: There is no intra or extra-axial fluid collection or mass lesion.  The basilar cisterns and ventricles have a normal appearance.  There is no CT evidence for acute infarction or hemorrhage.  Bone windows show no  calvarial fracture.  Note is made of dense atherosclerotic calcification of the internal carotid arteries.  IMPRESSION:  1. No evidence for acute intracranial abnormality. 2.  Atherosclerotic calcification of the carotid arteries.  CT CERVICAL SPINE  Findings: There are moderate degenerative changes within the cervical spine.  These are most notable at C4-5, C5-6, and C6-7. There is a mild spinal stenosis at C4-5, related to degenerative changes.  There is no evidence for acute fracture or dislocation. Images of the lung apices are unremarkable. Note is made of atherosclerotic calcification of the common carotid arteries.  IMPRESSION:  1.  Degenerative changes. 2. No evidence for acute  abnormality.   Original Report Authenticated By: Norva Pavlov, M.D.    Ct Chest W Contrast  06/17/2012  *RADIOLOGY REPORT*  Clinical Data:  Trauma.  CT CHEST, ABDOMEN AND PELVIS WITH CONTRAST  Technique:  Multidetector CT imaging of the chest, abdomen and pelvis was performed following the standard protocol during bolus administration of intravenous contrast.  Contrast: OMNIPAQUE IOHEXOL 300 MG/ML  SOLN  Comparison:   None.  CT CHEST  Findings:  Lungs/pleura: There is no pleural effusion identified. No pneumothorax or pulmonary contusion identified.  There is no airspace consolidation or atelectasis noted.  There is a pulmonary nodule in the left lower lobe measuring 4 mm, image 32/series 3.  Heart/Mediastinum: Heart size is normal.  There is no pericardial effusion.  There is no mediastinal or hilar adenopathy.  The aorta appears intact.  Bones/Musculoskeletal:  There is a fracture deformity involving the proximal body of the sternum.  This is age indeterminate.  No rib fractures are identified.  The thoracic spine appears intact.  IMPRESSION:  1.  No acute cardiopulmonary abnormalities. 2.  Age indeterminate fracture deformity involving the proximal body of the sternum.  Correlate for any focal tenderness in this area of  peri  CT ABDOMEN AND PELVIS  Findings:  There is marked diffuse low attenuation throughout the liver parenchyma.  The liver has a slightly nodular contour.  No focal liver abnormality identified.  The gallbladder is normal.  No biliary dilatation.  The pancreas is unremarkable.  Normal appearance of the spleen.  The adrenal glands are both normal.  The kidneys both appear slightly malrotated.  No acute renal abnormality noted.  The urinary bladder appears normal.  Normal caliber of the abdominal aorta.  There is no upper abdominal adenopathy identified.  No pelvic or inguinal adenopathy noted.  The stomach appears normal.  The small bowel loops are unremarkable.  Normal appearance of the colon.  Review of the visualized osseous structures is unremarkable for lytic or sclerotic lesion.  There is a first-degree anterolisthesis of L3 on L4 bilateral L3 pars defects are noted.  This is unchanged from 09/21/2008.  IMPRESSION:  1.  No acute findings identified. 2.  Chronic anterolisthesis of L3 on L4 with bilateral L4 pars defects. 3.  Cirrhosis and diffuse fatty infiltration of the liver.   Original Report Authenticated By: Signa Kell, M.D.  Ct Cervical Spine Wo Contrast  06/17/2012  *RADIOLOGY REPORT*  Clinical Data:  Trauma.  Larey Seat off porch 2 days ago.  Multiple areas of bruising.  CT HEAD WITHOUT CONTRAST CT CERVICAL SPINE WITHOUT CONTRAST  Technique:  Multidetector CT imaging of the head and cervical spine was performed following the standard protocol without intravenous contrast.  Multiplanar CT image reconstructions of the cervical spine were also generated.  Comparison:   None  CT HEAD  Findings: There is no intra or extra-axial fluid collection or mass lesion.  The basilar cisterns and ventricles have a normal appearance.  There is no CT evidence for acute infarction or hemorrhage.  Bone windows show no calvarial fracture.  Note is made of dense atherosclerotic calcification of the internal carotid  arteries.  IMPRESSION:  1. No evidence for acute intracranial abnormality. 2.  Atherosclerotic calcification of the carotid arteries.  CT CERVICAL SPINE  Findings: There are moderate degenerative changes within the cervical spine.  These are most notable at C4-5, C5-6, and C6-7. There is a mild spinal stenosis at C4-5, related to degenerative changes.  There is no evidence for acute fracture or dislocation. Images of the lung apices are unremarkable. Note is made of atherosclerotic calcification of the common carotid arteries.  IMPRESSION:  1.  Degenerative changes. 2. No evidence for acute  abnormality.   Original Report Authenticated By: Norva Pavlov, M.D.    Ct Abdomen Pelvis W Contrast  06/17/2012  *RADIOLOGY REPORT*  Clinical Data:  Trauma.  CT CHEST, ABDOMEN AND PELVIS WITH CONTRAST  Technique:  Multidetector CT imaging of the chest, abdomen and pelvis was performed following the standard protocol during bolus administration of intravenous contrast.  Contrast: OMNIPAQUE IOHEXOL 300 MG/ML  SOLN  Comparison:   None.  CT CHEST  Findings:  Lungs/pleura: There is no pleural effusion identified. No pneumothorax or pulmonary contusion identified.  There is no airspace consolidation or atelectasis noted.  There is a pulmonary nodule in the left lower lobe measuring 4 mm, image 32/series 3.  Heart/Mediastinum: Heart size is normal.  There is no pericardial effusion.  There is no mediastinal or hilar adenopathy.  The aorta appears intact.  Bones/Musculoskeletal:  There is a fracture deformity involving the proximal body of the sternum.  This is age indeterminate.  No rib fractures are identified.  The thoracic spine appears intact.  IMPRESSION:  1.  No acute cardiopulmonary abnormalities. 2.  Age indeterminate fracture deformity involving the proximal body of the sternum.  Correlate for any focal tenderness in this area of peri  CT ABDOMEN AND PELVIS  Findings:  There is marked diffuse low attenuation  throughout the liver parenchyma.  The liver has a slightly nodular contour.  No focal liver abnormality identified.  The gallbladder is normal.  No biliary dilatation.  The pancreas is unremarkable.  Normal appearance of the spleen.  The adrenal glands are both normal.  The kidneys both appear slightly malrotated.  No acute renal abnormality noted.  The urinary bladder appears normal.  Normal caliber of the abdominal aorta.  There is no upper abdominal adenopathy identified.  No pelvic or inguinal adenopathy noted.  The stomach appears normal.  The small bowel loops are unremarkable.  Normal appearance of the colon.  Review of the visualized osseous structures is unremarkable for lytic or sclerotic lesion.  There is a first-degree anterolisthesis of L3 on L4 bilateral L3 pars defects are noted.  This is unchanged from 09/21/2008.  IMPRESSION:  1.  No acute  findings identified. 2.  Chronic anterolisthesis of L3 on L4 with bilateral L4 pars defects. 3.  Cirrhosis and diffuse fatty infiltration of the liver.   Original Report Authenticated By: Signa Kell, M.D.     EKG: Independently reviewed. Sinus tachycardia, possible old septal infarct  Assessment/Plan Principal Problem:   Metabolic acidosis, increased anion gap Active Problems:   Chronic alcohol abuse   Hypotension   Tachycardia   Delirium tremens   Abnormal LFTs   Hyponatremia   Hypokalemia   Dehydration   Recurrent falls   Thrombocytopenia   Cirrhosis   Fracture of sternum   Diarrhea  The patient is a 52 year old gentleman who has multiple complications of his lifelong alcohol abuse. He is dehydrated, intoxicated, and likely on the verge of delirium tremens as he approaches 24 hours without alcohol. He appears to be in artery distress with diaphoresis, tachycardia and severe orthostatic hypotension. He has metabolic derangements consistent with alcoholic ketoacidosis as well as signs of liver failure with thrombocytopenia, failure to  thrive. No episodes of diarrhea or vomiting since arrival to ED. Active problem list above has been updated and reviewed for this admission.  Plan:  Admitted to step down unit given hypotension, high risk for DTs, and metabolic acidosis  Administer thiamine and folate followed by aggressive IV fluid resuscitation with D5 dextrose and normal saline to encourage correction of the alcoholic ketoacidosis and volume depletion.  Replete potassium, check a magnesium level he is nutritionally depleted, will also check a lipase since there is a history of decreased appetite and poor by mouth intake  Obtained an acute hepatitis panel for his abnormal LFTs which is likely related to cirrhosis from alcohol abuse.  Symptomatic treatment for pain related to his bruising and falls, morphine 2 mg every 3 hours. For pain  For nausea vomiting Reglan 10 mg every 6 hours when necessary  Social work consult for substance abuse resources, he may actually need placement  He has critical thrombocytopenia with a platelet count of 14 a repeat a CBC with a.m. labs there is no evidence of acute bleed at this time, his INR is in the normal range. His hemoglobin is normal but I suspect that he is hemoconcentrated.  Code Status presumed full code Family Communication: No family available at the bedside Disposition Plan: Anticipate 2-3 days inpatient for stabilization, detoxification and placement/safe discharge planning. Time spent: 70 minutes  [facility] Pager [phone]  If 7PM-7AM, please contact night-coverage [email or web address] Password [facility] 06/17/2012, 10:43 PM

## 2012-06-18 LAB — BASIC METABOLIC PANEL
BUN: 9 mg/dL (ref 6–23)
CO2: 23 mEq/L (ref 19–32)
Calcium: 8 mg/dL — ABNORMAL LOW (ref 8.4–10.5)
Chloride: 93 mEq/L — ABNORMAL LOW (ref 96–112)
Creatinine, Ser: 0.75 mg/dL (ref 0.50–1.35)
GFR calc Af Amer: >90 mL/min (ref >90)
GFR calc non Af Amer: >90 mL/min (ref >90)
Glucose, Bld: 67 mg/dL — ABNORMAL LOW (ref 70–99)
Potassium: 3.7 mEq/L (ref 3.5–5.1)
Sodium: 131 mEq/L — ABNORMAL LOW (ref 135–145)

## 2012-06-18 LAB — CBC
HCT: 32.2 % — ABNORMAL LOW (ref 39.0–52.0)
Hemoglobin: 11.5 g/dL — ABNORMAL LOW (ref 13.0–17.0)
MCH: 34.1 pg — ABNORMAL HIGH (ref 26.0–34.0)
MCHC: 35.7 g/dL (ref 30.0–36.0)
MCV: 95.5 fL (ref 78.0–100.0)
Platelets: 13 10*3/uL — CL (ref 150–400)
RBC: 3.37 MIL/uL — ABNORMAL LOW (ref 4.22–5.81)
RDW: 12.8 % (ref 11.5–15.5)
WBC: 4.8 10*3/uL (ref 4.0–10.5)

## 2012-06-18 LAB — TROPONIN I: Troponin I: <0.3 ng/mL (ref <0.30)

## 2012-06-18 LAB — MRSA PCR SCREENING: MRSA by PCR: NEGATIVE

## 2012-06-18 LAB — LIPASE, BLOOD: Lipase: 32 U/L (ref 11–59)

## 2012-06-18 LAB — TSH: TSH: 3.45 u[IU]/mL (ref 0.350–4.500)

## 2012-06-18 MED ORDER — FOLIC ACID 5 MG/ML IJ SOLN
INTRAMUSCULAR | Status: AC
  Filled 2012-06-18: qty 0.2

## 2012-06-18 MED ORDER — THIAMINE HCL 100 MG/ML IJ SOLN
INTRAMUSCULAR | Status: AC
  Filled 2012-06-18: qty 2

## 2012-06-19 LAB — HEPATITIS PANEL, ACUTE
HCV Ab: NEGATIVE
Hep A IgM: NEGATIVE
Hep B C IgM: NEGATIVE
Hepatitis B Surface Ag: NEGATIVE

## 2012-06-20 DIAGNOSIS — K7460 Unspecified cirrhosis of liver: Secondary | ICD-10-CM

## 2012-06-20 DIAGNOSIS — F1010 Alcohol abuse, uncomplicated: Secondary | ICD-10-CM

## 2012-06-20 DIAGNOSIS — R7989 Other specified abnormal findings of blood chemistry: Secondary | ICD-10-CM

## 2012-06-20 DIAGNOSIS — E860 Dehydration: Secondary | ICD-10-CM

## 2012-06-20 LAB — RETICULOCYTES
RBC.: 3.36 MIL/uL — ABNORMAL LOW (ref 4.22–5.81)
Retic Count, Absolute: 60.5 10*3/uL (ref 19.0–186.0)
Retic Ct Pct: 1.8 % (ref 0.4–3.1)

## 2012-06-20 LAB — IRON AND TIBC
Iron: 57 ug/dL (ref 42–135)
Saturation Ratios: 25 % (ref 20–55)
TIBC: 224 ug/dL (ref 215–435)
UIBC: 167 ug/dL (ref 125–400)

## 2012-06-20 LAB — VITAMIN B12: Vitamin B-12: 831 pg/mL (ref 211–911)

## 2012-06-20 LAB — FOLATE: Folate: >20 ng/mL

## 2012-06-20 LAB — FERRITIN: Ferritin: 1124 ng/mL — ABNORMAL HIGH (ref 22–322)

## 2012-06-20 MED ORDER — LORAZEPAM 2 MG/ML IJ SOLN
2.0000 mg | Freq: Once | INTRAMUSCULAR | Status: AC
  Administered 2012-06-20: 2 mg via INTRAVENOUS
  Filled 2012-06-20: qty 1

## 2012-06-20 MED ORDER — LORAZEPAM 2 MG/ML IJ SOLN
2.0000 mg | INTRAMUSCULAR | Status: DC | PRN
  Administered 2012-06-20 – 2012-06-21 (×7): 2 mg via INTRAVENOUS
  Filled 2012-06-20 (×7): qty 1

## 2012-06-20 NOTE — Progress Notes (Signed)
Utilization Review Complete  

## 2012-06-20 NOTE — Clinical Social Work Note (Signed)
CSW attempted to assess, however patient not medically ready to participate.  Will recheck later this AM.  Santa Genera, LCSW Clinical Social Worker [phone])

## 2012-06-20 NOTE — Progress Notes (Signed)
Called report to Zannie Kehr, RN. Pt was transferred to room 336 along with personal belongings via bed. Family at bedside and aware of patient being transferred.

## 2012-06-20 NOTE — Progress Notes (Signed)
     Subjective: This patient was admitted a couple of days ago and has actually not been seen by one of our team. There's been a failure of communication. He was admitted with hypotension, alcoholic ketoacidosis, dehydration. Now he is clearly in alcohol withdrawal. Hemodynamically he is stable. He drinks 18 beers a day.           Physical Exam: Blood pressure 131/85, pulse 135, temperature 98 F (36.7 C), temperature source Oral, resp. rate 20, height 5\' 6"  (1.676 m), weight 73.1 kg (161 lb 2.5 oz), SpO2 93.00%. Confused, delirious, tremulous, all consistent with alcohol withdrawal. He is not orientated in time and place. He knows his name and his date of birth. He does not know what my occupation is. Heart sounds are present and in sinus rhythm. Lung fields are clear. He has significant bruising on the left side of his trunk extending from the mid chest to his buttock area. This is nontender. Abdomen is soft, without masses. There is no tenderness in his abdomen. There is no hepatosplenomegaly. There are no obvious focal neurological signs.   Investigations:  Recent Results (from the past 240 hour(s))  MRSA PCR SCREENING     Status: None   Collection Time    06/17/12 11:15 PM      Result Value Range Status   MRSA by PCR NEGATIVE  NEGATIVE Final   Comment:            The GeneXpert MRSA Assay (FDA     approved for NASAL specimens     only), is one component of a     comprehensive MRSA colonization     surveillance program. It is not     intended to diagnose MRSA     infection nor to guide or     monitor treatment for     MRSA infections.     Basic Metabolic Panel:  Recent Labs  03/17/70 1900 06/18/12 0518  NA 124* 131*  K 3.2* 3.7  CL 81* 93*  CO2 20 23  GLUCOSE 86 67*  BUN 7 9  CREATININE 0.60 0.75  CALCIUM 9.2 8.0*  MG 1.8  --    Liver Function Tests:  Recent Labs  06/17/12 1900  AST 190*  ALT 85*  ALKPHOS 94  BILITOT 3.3*  PROT 8.1  ALBUMIN 4.0      CBC:  Recent Labs  06/17/12 1900 06/18/12 0518  WBC 4.9 4.8  NEUTROABS 3.4  --   HGB 13.1 11.5*  HCT 36.1* 32.2*  MCV 94.5 95.5  PLT 14* 13*        Medications: I have reviewed the patient's current medications.  Impression: 1. Alcohol withdrawal. 2. Alcoholism. 3. Dehydration, resolving. 4. Bruising left side of the trunk. CT scanning of the chest, abdomen does not show any hematoma. There are no fractures the daily. 5. Cirrhosis of the liver on CT scanning of the abdomen. Likely secondary to alcoholism.     Plan: 1. Reduce IV fluids, encourage oral intake. 2. Continue with alcohol withdrawal protocol. Monitor neurologically closely. 3. Patient can move to the medical floor, will still require a sitter.     LOS: 3 days   Wilson Singer Pager [phone]  06/20/2012, 9:46 AM

## 2012-06-20 NOTE — Progress Notes (Signed)
At 2210 patient got out of the bed and sat down in the floor.  Sitter at bedside.  VSS, no injurys noted.  MD notified.  Will continue to monitor.  Sitter still at bedside.

## 2012-06-20 NOTE — Clinical Social Work Note (Signed)
CSW attempted to assess patient, patient still not oriented.  States he is "in [place]", gives his home address and names several people who live w him.  When asked date, states "Wednesday."  Patient still unable to participate in assessment, will recheck tomorrow.  Santa Genera, LCSW Clinical Social Worker [phone])

## 2012-06-20 NOTE — Care Management Note (Signed)
    Page 1 of 1   06/23/2012     12:48:29 PM   CARE MANAGEMENT NOTE 06/23/2012  Patient:  William Barrett, William Barrett   Account Number:  [id number]  Date Initiated:  06/20/2012  Documentation initiated by:  Rosemary Holms  Subjective/Objective Assessment:   Pt admitted late Friday with Dehydration, Acidosis, profound ortho. hypotendsion with increase anion gap. Pt in ETOH w/drawal today. Lives at home with sister.     Action/Plan:   Anticipated DC Date:  06/24/2012   Anticipated DC Plan:  HOME/SELF CARE  In-house referral  Clinical Social Worker      DC Planning Services  CM consult      Choice offered to / List presented to:             Status of service:  Completed, signed off Medicare Important Message given?  YES (If response is "NO", the following Medicare IM given date fields will be blank) Date Medicare IM given:  06/23/2012 Date Additional Medicare IM given:    Discharge Disposition:  SKILLED NURSING FACILITY  Per UR Regulation:  Reviewed for med. necessity/level of care/duration of stay  If discussed at Long Length of Stay Meetings, dates discussed:    Comments:  06/23/12 Rosemary Holms RN BSN CM Pt DC'ing to [facility]. CM attempted to call sister at [phone] @ 11:10, 11:45 and 12:45 to discuss IM. Number reconfirmed with CSW. Always a busy signal  06/22/12 Rosemary Holms RN BSN CM 1045 Spoke briefly with pt. Not a very clear conversation but it seems he lives with his sister. No anticipated HH/DME needs.  06/20/12 Kylyn Mcdade Leanord Hawking RN BSN CM

## 2012-06-21 DIAGNOSIS — E871 Hypo-osmolality and hyponatremia: Secondary | ICD-10-CM

## 2012-06-21 LAB — COMPREHENSIVE METABOLIC PANEL
ALT: 80 U/L — ABNORMAL HIGH (ref 0–53)
AST: 133 U/L — ABNORMAL HIGH (ref 0–37)
Albumin: 3.4 g/dL — ABNORMAL LOW (ref 3.5–5.2)
Alkaline Phosphatase: 83 U/L (ref 39–117)
BUN: 4 mg/dL — ABNORMAL LOW (ref 6–23)
CO2: 24 mEq/L (ref 19–32)
Calcium: 8.7 mg/dL (ref 8.4–10.5)
Chloride: 99 mEq/L (ref 96–112)
Creatinine, Ser: 0.59 mg/dL (ref 0.50–1.35)
GFR calc Af Amer: >90 mL/min (ref >90)
GFR calc non Af Amer: >90 mL/min (ref >90)
Glucose, Bld: 120 mg/dL — ABNORMAL HIGH (ref 70–99)
Potassium: 2.4 mEq/L — CL (ref 3.5–5.1)
Sodium: 134 mEq/L — ABNORMAL LOW (ref 135–145)
Total Bilirubin: 3.1 mg/dL — ABNORMAL HIGH (ref 0.3–1.2)
Total Protein: 6.9 g/dL (ref 6.0–8.3)

## 2012-06-21 LAB — CBC
HCT: 31.8 % — ABNORMAL LOW (ref 39.0–52.0)
Hemoglobin: 11 g/dL — ABNORMAL LOW (ref 13.0–17.0)
MCH: 33.5 pg (ref 26.0–34.0)
MCHC: 34.6 g/dL (ref 30.0–36.0)
MCV: 97 fL (ref 78.0–100.0)
Platelets: 41 10*3/uL — ABNORMAL LOW (ref 150–400)
RBC: 3.28 MIL/uL — ABNORMAL LOW (ref 4.22–5.81)
RDW: 13.5 % (ref 11.5–15.5)
WBC: 4.3 10*3/uL (ref 4.0–10.5)

## 2012-06-21 LAB — TYPE AND SCREEN
ABO/RH(D): O POS
Antibody Screen: NEGATIVE

## 2012-06-21 LAB — CK: Total CK: 760 U/L — ABNORMAL HIGH (ref 7–232)

## 2012-06-21 LAB — PROTIME-INR
INR: 1.22 (ref 0.00–1.49)
Prothrombin Time: 15.2 seconds (ref 11.6–15.2)

## 2012-06-21 LAB — MAGNESIUM: Magnesium: 1.7 mg/dL (ref 1.5–2.5)

## 2012-06-21 MED ORDER — POTASSIUM CHLORIDE CRYS ER 20 MEQ PO TBCR: 40.0000 meq | EXTENDED_RELEASE_TABLET | Freq: Once | ORAL | Status: DC

## 2012-06-21 MED ORDER — MAGNESIUM SULFATE 40 MG/ML IJ SOLN
2.0000 g | Freq: Once | INTRAMUSCULAR | Status: AC
  Administered 2012-06-21: 2 g via INTRAVENOUS
  Filled 2012-06-21: qty 50

## 2012-06-21 MED ORDER — LORAZEPAM 2 MG/ML IJ SOLN
2.0000 mg | Freq: Once | INTRAMUSCULAR | Status: AC
  Administered 2012-06-21: 2 mg via INTRAVENOUS
  Filled 2012-06-21: qty 1

## 2012-06-21 MED ORDER — DEXTROSE-NACL 5-0.9 % IV SOLN
INTRAVENOUS | Status: DC
  Administered 2012-06-21 – 2012-06-22 (×5): via INTRAVENOUS

## 2012-06-21 MED ORDER — HYDRALAZINE HCL 20 MG/ML IJ SOLN: 5.0000 mg | INTRAMUSCULAR | Status: DC | PRN

## 2012-06-21 MED ORDER — LORAZEPAM 2 MG/ML IJ SOLN
2.0000 mg | INTRAMUSCULAR | Status: DC | PRN
  Administered 2012-06-21 (×7): 2 mg via INTRAVENOUS
  Filled 2012-06-21 (×4): qty 1
  Filled 2012-06-21: qty 2
  Filled 2012-06-21: qty 1

## 2012-06-21 MED ORDER — POTASSIUM CHLORIDE 10 MEQ/100ML IV SOLN
10.0000 meq | INTRAVENOUS | Status: AC
  Administered 2012-06-21 (×6): 10 meq via INTRAVENOUS
  Filled 2012-06-21: qty 400
  Filled 2012-06-21: qty 200

## 2012-06-21 MED ORDER — POTASSIUM CHLORIDE CRYS ER 20 MEQ PO TBCR
40.0000 meq | EXTENDED_RELEASE_TABLET | ORAL | Status: AC
  Administered 2012-06-21 (×3): 40 meq via ORAL
  Filled 2012-06-21 (×3): qty 2

## 2012-06-21 NOTE — Progress Notes (Signed)
eLink Physician-Brief Progress Note Patient Name: William Barrett DOB: 1961-01-31 MRN: [id number]  Date of Service  06/21/2012   HPI/Events of Note   K 2.4  eICU Interventions  PO KCL ordered   Intervention Category Major Interventions: Electrolyte abnormality - evaluation and management  Shan Levans 06/21/2012, 6:45 AM

## 2012-06-21 NOTE — Progress Notes (Signed)
William Barrett ZOX:[id number] DOB: Jan 24, 1961 DOA: 06/17/2012  Called to see pt at 1:54 AM  Pt diaphoretic, agitated, and delirious Monitor disconnected due to aggitation Vitals check: T 97.5, P 150, BP 198/118 Jaundice and extensive left flank bruising as noted earlier.  Assessment:  Delirium Tremens with CVS instability Alcoholic liver disease Coagulopathy due to liver dz Thrombocytopenia due to liver diz extensive ecchymoses r/o retroperitoneal bleed ( h/o fall on [place] at home prior to admission)  Plan:  Transfer to ICU to monitor vitals and airway while increasing dosing frequency of Ativan Consider intubation, but try to avoid because of coagulopathy/thrombocytopenia Consider FFP if INR deranged and Hb continues to fall Consider CT abd w/o contrast r/o retroperitoneal bleed if Hb continues to fall significantly, or becoming hypotensive) Consider GI consult to assist with management of complications of  Liver Dz

## 2012-06-21 NOTE — Progress Notes (Signed)
At 0150 patients bp of 198/118, heart rate of 150.  Patient very diaphoretic, combative, trying to get out of the bed.  MD notified. Orders given and followed.  Patient transferred to the ICU.

## 2012-06-21 NOTE — Clinical Social Work Note (Signed)
Patient continues to be oriented to person only, CSW cannot complete SBIRT or psychosocial assessment requested.  Will continue to stand by to complete when patient is medically ready.  Santa Genera, LCSW Clinical Social Worker [phone])

## 2012-06-21 NOTE — Progress Notes (Addendum)
Subjective: This patient became more delirious once he went to the floor yesterday and became hypotensive, tachycardic and very confused. He was transferred back to the intensive care unit for closer monitoring. He has been given significant doses of intravenous Ativan and now his blood pressure appears to be in much improved and he is not as agitated as he was..           Physical Exam: Blood pressure 198/118, pulse 121, temperature 97.5 F (36.4 C), temperature source Oral, resp. rate 19, height 5\' 6"  (1.676 m), weight 73.1 kg (161 lb 2.5 oz), SpO2 93.00%. Confused, delirious, tremulous, all consistent with alcohol withdrawal, however less so than yesterday. He is not orientated in time and place. He knows his name and his date of birth. He does not know what my occupation is. Heart sounds are present and in sinus rhythm. Lung fields are clear. He has significant bruising on the left side of his trunk extending from the mid chest to his buttock area. This is nontender. Abdomen is soft, without masses.  There is no tenderness in his abdomen. There is no hepatosplenomegaly. There are no obvious focal neurological signs. He is moving all his limbs on command. He does not have any facial weakness.   Investigations:  Recent Results (from the past 240 hour(s))  MRSA PCR SCREENING     Status: None   Collection Time    06/17/12 11:15 PM      Result Value Range Status   MRSA by PCR NEGATIVE  NEGATIVE Final   Comment:            The GeneXpert MRSA Assay (FDA     approved for NASAL specimens     only), is one component of a     comprehensive MRSA colonization     surveillance program. It is not     intended to diagnose MRSA     infection nor to guide or     monitor treatment for     MRSA infections.     Basic Metabolic Panel:  Recent Labs  51/88/41 0428 06/21/12 0511  NA 134*  --   K 2.4*  --   CL 99  --   CO2 24  --   GLUCOSE 120*  --   BUN 4*  --   CREATININE 0.59   --   CALCIUM 8.7  --   MG  --  1.7   Liver Function Tests:  Recent Labs  06/21/12 0428  AST 133*  ALT 80*  ALKPHOS 83  BILITOT 3.1*  PROT 6.9  ALBUMIN 3.4*     CBC:  Recent Labs  06/21/12 0428  WBC 4.3  HGB 11.0*  HCT 31.8*  MCV 97.0  PLT 41*        Medications: I have reviewed the patient's current medications.  Impression: 1. Alcohol withdrawal. 2. Alcoholism. 3. Dehydration, resolving. 4. Bruising left side of the trunk. CT scanning of the chest, abdomen does not show any hematoma. There are no fractures the daily. 5. Cirrhosis of the liver on CT scanning of the abdomen. Likely secondary to alcoholism. 6. Hypokalemia.     Plan: 1. Continue with intravenous fluids. Replete potassium, as ordered. 2. Continue with intravenous Ativan as required. I do not see a clinical need to scan his brain again as there are no focal neurological signs. 3. Consider gastroenterology consultation, if his lab work deteriorates. 4. Keep patient in intensive care unit today.  LOS: 4 days   Wilson Singer Pager [phone]  06/21/2012, 7:32 AM

## 2012-06-22 DIAGNOSIS — F10231 Alcohol dependence with withdrawal delirium: Principal | ICD-10-CM

## 2012-06-22 LAB — CBC
HCT: 29.9 % — ABNORMAL LOW (ref 39.0–52.0)
Hemoglobin: 10.4 g/dL — ABNORMAL LOW (ref 13.0–17.0)
MCH: 34.7 pg — ABNORMAL HIGH (ref 26.0–34.0)
MCHC: 34.8 g/dL (ref 30.0–36.0)
MCV: 99.7 fL (ref 78.0–100.0)
Platelets: 49 10*3/uL — ABNORMAL LOW (ref 150–400)
RBC: 3 MIL/uL — ABNORMAL LOW (ref 4.22–5.81)
RDW: 14.4 % (ref 11.5–15.5)
WBC: 4.1 10*3/uL (ref 4.0–10.5)

## 2012-06-22 LAB — COMPREHENSIVE METABOLIC PANEL
ALT: 69 U/L — ABNORMAL HIGH (ref 0–53)
AST: 102 U/L — ABNORMAL HIGH (ref 0–37)
Albumin: 2.9 g/dL — ABNORMAL LOW (ref 3.5–5.2)
Alkaline Phosphatase: 71 U/L (ref 39–117)
BUN: 4 mg/dL — ABNORMAL LOW (ref 6–23)
CO2: 24 mEq/L (ref 19–32)
Calcium: 8.2 mg/dL — ABNORMAL LOW (ref 8.4–10.5)
Chloride: 106 mEq/L (ref 96–112)
Creatinine, Ser: 0.61 mg/dL (ref 0.50–1.35)
GFR calc Af Amer: >90 mL/min (ref >90)
GFR calc non Af Amer: >90 mL/min (ref >90)
Glucose, Bld: 103 mg/dL — ABNORMAL HIGH (ref 70–99)
Potassium: 4 mEq/L (ref 3.5–5.1)
Sodium: 136 mEq/L (ref 135–145)
Total Bilirubin: 2.6 mg/dL — ABNORMAL HIGH (ref 0.3–1.2)
Total Protein: 6.3 g/dL (ref 6.0–8.3)

## 2012-06-22 MED ORDER — LORAZEPAM 1 MG PO TABS
1.0000 mg | ORAL_TABLET | Freq: Two times a day (BID) | ORAL | Status: DC
  Administered 2012-06-22 – 2012-06-23 (×3): 1 mg via ORAL
  Filled 2012-06-22: qty 2
  Filled 2012-06-22: qty 1

## 2012-06-22 MED ORDER — LORAZEPAM 1 MG PO TABS
1.0000 mg | ORAL_TABLET | Freq: Four times a day (QID) | ORAL | Status: DC | PRN
  Filled 2012-06-22: qty 1

## 2012-06-22 MED ORDER — FENTANYL CITRATE 0.05 MG/ML IJ SOLN
25.0000 ug | INTRAMUSCULAR | Status: DC | PRN
  Administered 2012-06-22: 50 ug via INTRAVENOUS
  Filled 2012-06-22: qty 2

## 2012-06-22 NOTE — Clinical Social Work Psychosocial (Signed)
    Clinical Social Work Department BRIEF PSYCHOSOCIAL ASSESSMENT 06/22/2012  Patient:  William Barrett, William Barrett     Account Number:  [id number]     Admit date:  06/17/2012  Clinical Social Worker:  Santa Genera, CLINICAL SOCIAL WORKER  Date/Time:  06/22/2012 03:00 PM  Referred by:  Physician  Date Referred:  06/22/2012 Referred for  Substance Abuse  SNF Placement   Other Referral:   Interview type:  Patient Other interview type:   Tried to call sister at patient request, but phone numbers on facesheet were incorrect and patient could not provide    PSYCHOSOCIAL DATA Living Status:  FAMILY Admitted from facility:   Level of care:   Primary support name:  William Barrett Primary support relationship to patient:  SIBLING Degree of support available:   Limited    CURRENT CONCERNS Current Concerns  Substance Abuse  Post-Acute Placement   Other Concerns:    SOCIAL WORK ASSESSMENT / PLAN CSW met w patient at bedside, patient oriented to person and place, not to time.  Seemed to have difficulty responding to CSW's questions, but prompts was able to participate in assessment adequately.  Completed SBIRT w score of 23, indicating high risk drinking.  Patient states he drinks 8 cans of beer a day and has done so for many years.  Patient has previously told ACT team assessor that he will cut down on drinking and did not want any referrals for substance use treatment.    Patient is on disability due to his "nerves" which he reports became bad after his mother died.  Patient says that he does not know any specific diagnosis, does not take any psychiatric medications and does not see any mental health providers.  He thinks he may have been hospitalized for his "nerves" at [facility] at some point in the past.    Patient lives w his sister and her boyfriend in [place].  He says that he mostly watches TV and people during the day.  [place]  He does not drive and had a DWI charge a  month ago.  He stopped driving at that time.    CSW explained PT recommendation for SNF placement, patient agreed to have CSW seek a bed for him.  Expressed no preferences for location, requested that I call his sister. Could not provide her number and those on the facesheet were incorrect.  CSW will proceed w process of seeking bed and keep patient and MD informed.   Assessment/plan status:  Psychosocial Support/Ongoing Assessment of Needs Other assessment/ plan:   Information/referral to community resources:   SNF list    PATIENT'S/FAMILY'S RESPONSE TO PLAN OF CARE: Patient agreeable to placement process    Santa Genera, LCSW Clinical Social Worker [phone])

## 2012-06-22 NOTE — Progress Notes (Signed)
eLink Physician-Brief Progress Note Patient Name: KATHLEEN LIKINS DOB: 10-26-1960 MRN: [id number]  Date of Service  06/22/2012   HPI/Events of Note   Pt c/o pain, refuses PO  eICU Interventions  IV fentanyl prn ordered   Intervention Category Intermediate Interventions: Pain - evaluation and management  Shan Levans 06/22/2012, 12:15 AM

## 2012-06-22 NOTE — Clinical Social Work Placement (Signed)
    Clinical Social Work Department CLINICAL SOCIAL WORK PLACEMENT NOTE 06/23/2012  Patient:  William Barrett, William Barrett  Account Number:  [id number] Admit date:  06/17/2012  Clinical Social Worker:  Santa Genera, CLINICAL SOCIAL WORKER  Date/time:  06/22/2012 04:00 PM  Clinical Social Work is seeking post-discharge placement for this patient at the following level of care:   SKILLED NURSING   (*CSW will update this form in Epic as items are completed)   06/22/2012  Patient/family provided with [name] Department of Clinical Social Work's list of facilities offering this level of care within the geographic area requested by the patient (or if unable, by the patient's family).  06/22/2012  Patient/family informed of their freedom to choose among providers that offer the needed level of care, that participate in Medicare, Medicaid or managed care program needed by the patient, have an available bed and are willing to accept the patient.  06/22/2012  Patient/family informed of [facility]' ownership interest in [facility], as well as of the fact that they are under no obligation to receive care at this facility.  PASARR submitted to EDS on 06/22/2012 PASARR number received from EDS on   FL2 transmitted to all facilities in geographic area requested by pt/family on  06/22/2012 FL2 transmitted to all facilities within larger geographic area on   Patient informed that his/her managed care company has contracts with or will negotiate with  certain facilities, including the following:     Patient/family informed of bed offers received:  06/23/2012 Patient chooses bed at [name] Physician recommends and patient chooses bed at    Patient to be transferred to [name] on  06/23/2012 Patient to be transferred to facility by [name]  The following physician request were entered in Epic:   Additional Comments: Sister provided  consent for patient for placement.  Santa Genera, LCSW Clinical Social Worker [phone])

## 2012-06-22 NOTE — Progress Notes (Signed)
ACT team notified of consult.  Writer talked with William Barrett with the ACT team.  Patient calm and following directions.  Answers questions appropriately. No acute distress noted.  Safety mitts removed.  Patient feeding self and taking po's without difficulty.  Out of bed with assistance to wheelchair.  Pleasant and talking with staff. Ready for transfer to room 328.

## 2012-06-22 NOTE — BH Assessment (Signed)
Assessment Note   William Barrett is an 52 y.o. male. The patient was admitted through the ED on 06/17/12 medical issues associated with alcohol abuse. The patient was weak and had had  multiple falls. He had a decreased appetite along with emesis and diarrhea. After admission he became delusional  Due to withdrawalsl. Today he is alert and he knows he is at [facility] and that he lives in [place]. He does not know the day , date, or year.  He denies any previous history of treatment for substance abuse. He does  Report a history of treatment for depression at [place]. The patient is neither suicidal nor homicidal. He is not psychotic.  He does not seem to be aware of the connections between his continued drinking and his physical problems. His plan to deal with his drinking  Is to "slack Back". At this time he is not interested in any type of treatment. Discussed the patient with Dr Karilyn Cota. He agrees that  The patient is not making good decisions about his health and well being at this point. As the patient is not medically clear he will continue his treatment on the medical floor.  Axis I: Alcohol Abuse and Substance Induced Mood Disorder Axis II: Deferred Axis III:  Past Medical History  Diagnosis Date  . Hypertension    Axis IV: problems related to social environment and problems with access to health care services Axis V: 31-40 impairment in reality testing  Past Medical History:  Past Medical History  Diagnosis Date  . Hypertension     History reviewed. No pertinent past surgical history.  Family History: History reviewed. No pertinent family history.  Social History:  reports that he has been smoking Cigarettes.  He has been smoking about 0.00 packs per day. He does not have any smokeless tobacco history on file. He reports that he drinks about 10.8 ounces of alcohol per week. He reports that he does not use illicit drugs.  Additional Social History:     CIWA:  CIWA-Ar BP: 138/101 mmHg Pulse Rate: 103 Nausea and Vomiting: no nausea and no vomiting Tactile Disturbances: none Tremor: not visible, but can be felt fingertip to fingertip Auditory Disturbances: not present Paroxysmal Sweats: no sweat visible Visual Disturbances: not present Anxiety: mildly anxious Headache, Fullness in Head: none present Agitation: normal activity Orientation and Clouding of Sensorium: oriented and can do serial additions CIWA-Ar Total: 2 COWS:    Allergies: No Known Allergies  Home Medications:  No prescriptions prior to admission    OB/GYN Status:  No LMP for male patient.  General Assessment Data Location of Assessment: [facility] ([facility] UNIT 300-Room 328) ACT Assessment: Yes Living Arrangements: Other relatives Can pt return to current living arrangement?: Yes Admission Status: Voluntary Is patient capable of signing voluntary admission?: Yes Transfer from: Acute Hospital Referral Source: Medical Floor Inpatient  Education Status Is patient currently in school?: No  Risk to self Suicidal Ideation: No Suicidal Intent: No Is patient at risk for suicide?: No Suicidal Plan?: No Access to Means: No What has been your use of drugs/alcohol within the last 12 months?: daily use of alcohol(beer) family reports 18 daily--patient reports 8 beers daily Previous Attempts/Gestures: No How many times?: 0 Other Self Harm Risks: continued use of alcohol Triggers for Past Attempts: None known Intentional Self Injurious Behavior: None Family Suicide History: No Recent stressful life event(s): Recent negative physical changes Persecutory voices/beliefs?: No Depression: Yes Depression Symptoms: Isolating;Loss of interest in  usual pleasures Substance abuse history and/or treatment for substance abuse?: Yes (denies treatment for substance use) Suicide prevention information given to non-admitted patients: Yes  Risk to Others Homicidal Ideation: No Thoughts of Harm  to Others: No Current Homicidal Intent: No Current Homicidal Plan: No Access to Homicidal Means: No History of harm to others?: No Assessment of Violence: None Noted Does patient have access to weapons?: No Criminal Charges Pending?: No Does patient have a court date: No  Psychosis Hallucinations: None noted Delusions: None noted  Mental Status Report Appear/Hygiene: Disheveled Eye Contact: Good Motor Activity: Psychomotor retardation Speech: Soft;Slow Level of Consciousness: Alert Mood: Anhedonia Affect: Blunted Anxiety Level: Minimal Thought Processes: Coherent;Circumstantial Judgement: Impaired Orientation: Person;Place Obsessive Compulsive Thoughts/Behaviors: Minimal  Cognitive Functioning Concentration: Decreased Memory: Recent Impaired;Remote Impaired IQ: Average Insight: Poor Impulse Control: Poor Appetite: Poor Weight Loss:  (UTA) Sleep: No Change Vegetative Symptoms: Decreased grooming  ADLScreening [facility] Assessment Services) Patient's cognitive ability adequate to safely complete daily activities?: Yes Patient able to express need for assistance with ADLs?: Yes Independently performs ADLs?: Yes (appropriate for developmental age)  Abuse/Neglect [facility]) Physical Abuse: Denies Verbal Abuse: Denies Sexual Abuse: Denies  Prior Inpatient Therapy Prior Inpatient Therapy: No  Prior Outpatient Therapy Prior Outpatient Therapy: Yes Prior Therapy Dates: unknown Prior Therapy Facilty/Provider(s): [facility] Reason for Treatment: depression  ADL Screening (condition at time of admission) Patient's cognitive ability adequate to safely complete daily activities?: Yes Patient able to express need for assistance with ADLs?: Yes Independently performs ADLs?: Yes (appropriate for developmental age) Weakness of Legs: None Weakness of Arms/Hands: None  Home Assistive Devices/Equipment Home Assistive Devices/Equipment: None  Therapy Consults (therapy consults  require a physician order) PT Evaluation Needed: No OT Evalulation Needed: No SLP Evaluation Needed: No Abuse/Neglect Assessment (Assessment to be complete while patient is alone) Physical Abuse: Denies Verbal Abuse: Denies Sexual Abuse: Denies Exploitation of patient/patient's resources: Denies Self-Neglect: Denies Possible abuse reported to:: Other (Comment) Values / Beliefs Cultural Requests During Hospitalization: None Spiritual Requests During Hospitalization: None Consults Spiritual Care Consult Needed: No Social Work Consult Needed: Yes (Comment) Advance Directives (For Healthcare) Advance Directive: Patient does not have advance directive;Patient would not like information Pre-existing out of facility DNR order (yellow form or pink MOST form): No Nutrition Screen- [facility] Adult/[facility] Patient's home diet: Regular Have you recently lost weight without trying?: No Have you been eating poorly because of a decreased appetite?: No Malnutrition Screening Tool Score: 0  Additional Information 1:1 In Past 12 Months?: No CIRT Risk: No Elopement Risk: No Does patient have medical clearance?: No     Disposition: REMAIN ON MEDICAL FLOOR. HAVE CSW LOOK INTO LIVING SITUATION, FOR SAFETY CONCERNS. CONSIDER PLACEMENT. DOES PATIENT HAVE CAPACITY TO MAKE DECISIONS REGARDING HIS HEALTH CARE. Disposition Initial Assessment Completed for this Encounter: Yes Disposition of Patient: Other dispositions Other disposition(s): To current provider;Other (Comment) (remain on medical floor)  On Site Evaluation by:   Reviewed with Physician:     Jearld Pies 06/22/2012 10:31 AM

## 2012-06-22 NOTE — Evaluation (Signed)
Physical Therapy Evaluation Patient Details Name: William Barrett MRN: [id number] DOB: May 28, 1960 Today's Date: 06/22/2012 Time: 4098-1191 PT Time Calculation (min): 28 min  PT Assessment / Plan / Recommendation Clinical Impression  William Barrett is a 52 yo male who normally uses no assistive device to ambulate.  Examination today shows significant weakness of B LE as well as decreased balance.  Pt iwll benefit from skilled PT to improve safety of ambulation and strength.  At this time I do not feel pt would be safe to go home with her sister.    PT Assessment  Patient needs continued PT services    Follow Up Recommendations  SNF    Does the patient have the potential to tolerate intense rehabilitation    no  Barriers to Discharge  none      Equipment Recommendations    next venue   Recommendations for Other Services   none  Frequency Min 3X/week    Precautions / Restrictions Precautions Precautions: Fall Restrictions Weight Bearing Restrictions: No   Pertinent Vitals/Pain 0/10      Mobility  Bed Mobility Bed Mobility: Supine to Sit Supine to Sit: 5: Supervision Transfers Transfers: Sit to Stand;Stand Pivot Transfers;Stand to Sit Sit to Stand: 4: Min guard Stand to Sit: 4: Min guard Stand Pivot Transfers: 4: Min guard Ambulation/Gait Ambulation/Gait Assistance: Not tested (comment) Assistive device: None (pt will need rolling walker to ambulate. He is not steady ) Stairs: No    Exercises General Exercises - Lower Extremity Gluteal Sets: Both;10 reps (bridge ) Long Arc Quad: AROM;Both;10 reps;Seated Heel Slides: AROM;Both;10 reps;Supine Hip ABduction/ADduction: Strengthening;Both;10 reps (isometrically) Straight Leg Raises: AROM;Both;10 reps Mini-Sqauts: Strengthening;5 reps   PT Diagnosis: Difficulty walking;Generalized weakness  PT Problem List: Decreased strength;Decreased activity tolerance PT Treatment Interventions: Gait training;Stair  training;Therapeutic exercise;Balance training   PT Goals Acute Rehab PT Goals PT Goal Formulation: With patient Time For Goal Achievement: 06/24/12 Potential to Achieve Goals: Good Pt will go Sit to Stand: with modified independence PT Goal: Sit to Stand - Progress: Goal set today Pt will Transfer Bed to Chair/Chair to Bed: with modified independence PT Transfer Goal: Bed to Chair/Chair to Bed - Progress: Goal set today Pt will Ambulate: 16 - 50 feet;with least restrictive assistive device PT Goal: Ambulate - Progress: Goal set today Pt will Go Up / Down Stairs: 3-5 stairs;with mod assist PT Goal: Up/Down Stairs - Progress: Goal set today  Visit Information  Last PT Received On: 06/22/12    Subjective Data  Subjective: Pt states he feels better when asked.   Patient Stated Goal: not stated   Prior Functioning  Home Living Lives With: Other (Comment) (sister) Available Help at Discharge: Family Type of Home: Mobile home Home Access: Stairs to enter Entrance Stairs-Number of Steps: 3 Entrance Stairs-Rails: Right Home Layout: One level Bathroom Shower/Tub: Teacher, adult education: None Prior Function Level of Independence: Independent Able to Take Stairs?: Yes Driving: Yes Vocation: On disability Communication Communication:  (pt is not actively engaging in conversation.  Will answer) Dominant Hand: Right    Cognition  Cognition Overall Cognitive Status: Appears within functional limits for tasks assessed/performed Arousal/Alertness: Awake/alert Orientation Level: Appears intact for tasks assessed Behavior During Session: [facility] for tasks performed    Extremity/Trunk Assessment Right Lower Extremity Assessment RLE ROM/Strength/Tone: Deficits RLE ROM/Strength/Tone Deficits: decreased LE strength generally 3-/5 RLE Sensation: Deficits Left Lower Extremity Assessment LLE ROM/Strength/Tone: Deficits LLE ROM/Strength/Tone Deficits: strength generally 3-/5    Balance Balance Balance Assessed:  Yes Static Standing Balance Static Standing - Balance Support: No upper extremity supported Static Standing - Level of Assistance: 5: Stand by assistance Static Standing - Comment/# of Minutes: 1  End of Session PT - End of Session Equipment Utilized During Treatment: Gait belt Activity Tolerance: Patient tolerated treatment well Patient left: in chair;with call bell/phone within reach;with chair alarm set;with family/visitor present  GP     William Barrett,CINDY 06/22/2012, 12:22 PM

## 2012-06-22 NOTE — Progress Notes (Signed)
     Subjective: This patient has improved significantly overnight. He is no longer delirious and appears to be alert and orientated. He has only required 2 mg IV of Ativan last night.           Physical Exam: Blood pressure 86/61, pulse 78, temperature 98.1 F (36.7 C), temperature source Oral, resp. rate 14, height 5\' 6"  (1.676 m), weight 73.1 kg (161 lb 2.5 oz), SpO2 93.00%. Alert and orientated now. Does not appear delirious now.  Heart sounds are present and in sinus rhythm. Lung fields are clear. He has significant bruising on the left side of his trunk extending from the mid chest to his buttock area. This is nontender. Abdomen is soft, without masses.  There is no tenderness in his abdomen. There is no hepatosplenomegaly. There are no obvious focal neurological signs. He is moving all his limbs on command. He does not have any facial weakness.   Investigations:  Recent Results (from the past 240 hour(s))  MRSA PCR SCREENING     Status: None   Collection Time    06/17/12 11:15 PM      Result Value Range Status   MRSA by PCR NEGATIVE  NEGATIVE Final   Comment:            The GeneXpert MRSA Assay (FDA     approved for NASAL specimens     only), is one component of a     comprehensive MRSA colonization     surveillance program. It is not     intended to diagnose MRSA     infection nor to guide or     monitor treatment for     MRSA infections.     Basic Metabolic Panel:  Recent Labs  40/98/11 0428 06/21/12 0511 06/22/12 0537  NA 134*  --  136  K 2.4*  --  4.0  CL 99  --  106  CO2 24  --  24  GLUCOSE 120*  --  103*  BUN 4*  --  4*  CREATININE 0.59  --  0.61  CALCIUM 8.7  --  8.2*  MG  --  1.7  --    Liver Function Tests:  Recent Labs  06/21/12 0428 06/22/12 0537  AST 133* 102*  ALT 80* 69*  ALKPHOS 83 71  BILITOT 3.1* 2.6*  PROT 6.9 6.3  ALBUMIN 3.4* 2.9*     CBC:  Recent Labs  06/21/12 0428 06/22/12 0537  WBC 4.3 4.1  HGB 11.0* 10.4*   HCT 31.8* 29.9*  MCV 97.0 99.7  PLT 41* 49*        Medications: I have reviewed the patient's current medications.  Impression: 1. Alcohol withdrawal, improving. 2. Alcoholism. 3. Dehydration, resolved. 4. Bruising left side of the trunk. CT scanning of the chest, abdomen does not show any hematoma. There are no fractures the daily. 5. Cirrhosis of the liver on CT scanning of the abdomen. Likely secondary to alcoholism.      Plan: 1. Continue with intravenous fluids at a reduced rate.  2. Continue with oral Ativan 1 mg twice a day with when necessary every 6 hours as needed. No need for intravenous Ativan at this point. 3. Act team consultation. 4. Physical therapy evaluation. 5. Patient can transfer to the medical floor.     LOS: 5 days   Wilson Singer Pager [phone]  06/22/2012, 7:43 AM

## 2012-06-23 LAB — COMPREHENSIVE METABOLIC PANEL
ALT: 83 U/L — ABNORMAL HIGH (ref 0–53)
AST: 112 U/L — ABNORMAL HIGH (ref 0–37)
Albumin: 3.2 g/dL — ABNORMAL LOW (ref 3.5–5.2)
Alkaline Phosphatase: 88 U/L (ref 39–117)
BUN: 6 mg/dL (ref 6–23)
CO2: 25 mEq/L (ref 19–32)
Calcium: 9.1 mg/dL (ref 8.4–10.5)
Chloride: 97 mEq/L (ref 96–112)
Creatinine, Ser: 0.59 mg/dL (ref 0.50–1.35)
GFR calc Af Amer: >90 mL/min (ref >90)
GFR calc non Af Amer: >90 mL/min (ref >90)
Glucose, Bld: 98 mg/dL (ref 70–99)
Potassium: 3.3 mEq/L — ABNORMAL LOW (ref 3.5–5.1)
Sodium: 132 mEq/L — ABNORMAL LOW (ref 135–145)
Total Bilirubin: 2.8 mg/dL — ABNORMAL HIGH (ref 0.3–1.2)
Total Protein: 7 g/dL (ref 6.0–8.3)

## 2012-06-23 LAB — CBC
HCT: 32.7 % — ABNORMAL LOW (ref 39.0–52.0)
Hemoglobin: 11.3 g/dL — ABNORMAL LOW (ref 13.0–17.0)
MCH: 33.9 pg (ref 26.0–34.0)
MCHC: 34.6 g/dL (ref 30.0–36.0)
MCV: 98.2 fL (ref 78.0–100.0)
Platelets: 75 10*3/uL — ABNORMAL LOW (ref 150–400)
RBC: 3.33 MIL/uL — ABNORMAL LOW (ref 4.22–5.81)
RDW: 14.4 % (ref 11.5–15.5)
WBC: 4.2 10*3/uL (ref 4.0–10.5)

## 2012-06-23 MED ORDER — LORAZEPAM 1 MG PO TABS: 1.0000 mg | ORAL_TABLET | Freq: Four times a day (QID) | ORAL | Status: DC | PRN

## 2012-06-23 MED ORDER — LORAZEPAM 0.5 MG PO TABS: 0.5000 mg | ORAL_TABLET | Freq: Two times a day (BID) | ORAL | Status: DC

## 2012-06-23 MED ORDER — POTASSIUM CHLORIDE CRYS ER 20 MEQ PO TBCR
40.0000 meq | EXTENDED_RELEASE_TABLET | Freq: Once | ORAL | Status: AC
  Administered 2012-06-23: 40 meq via ORAL
  Filled 2012-06-23: qty 2

## 2012-06-23 MED ORDER — FOLIC ACID 1 MG PO TABS: 1.0000 mg | ORAL_TABLET | Freq: Every day | ORAL | Status: DC

## 2012-06-23 MED ORDER — THIAMINE HCL 100 MG PO TABS: 100.0000 mg | ORAL_TABLET | Freq: Every day | ORAL | Status: DC

## 2012-06-23 MED ORDER — ADULT MULTIVITAMIN W/MINERALS CH: 1.0000 | ORAL_TABLET | Freq: Every day | ORAL | Status: DC

## 2012-06-23 MED ORDER — ALUM & MAG HYDROXIDE-SIMETH 200-200-20 MG/5ML PO SUSP: 30.0000 mL | Freq: Four times a day (QID) | ORAL | Status: DC | PRN

## 2012-06-23 NOTE — Clinical Social Work Note (Signed)
Patient ready for discharge today to [facility].  Sister and patient informed and agreeable.  Patient will go via [facility] approx 1 PM today.  Discharge summary faxed to facility via [facility].  FL2 reviewed w RN and updated as needed.  Discharge packet prepared and placed w shadow chart.  CSW signing off as no further SW needs identified.  Santa Genera, LCSW Clinical Social Worker [phone])

## 2012-06-23 NOTE — Progress Notes (Addendum)
Subjective: This patient appears to be stabilizing with no further evidence of alcohol withdrawal. It is not clear what his baseline status is and his sister is not available for contact at the present time. He was seen by physical therapy, who has recommended skilled nursing facility placement for rehabilitation.           Physical Exam: Blood pressure 129/88, pulse 86, temperature 97.9 F (36.6 C), temperature source Oral, resp. rate 20, height 5\' 6"  (1.676 m), weight 73.1 kg (161 lb 2.5 oz), SpO2 96.00%. Alert and orientated to place and person. Does not appear delirious now.  Heart sounds are present and in sinus rhythm. Lung fields are clear. He has significant bruising on the left side of his trunk extending from the mid chest to his buttock area. This is nontender. Abdomen is soft, without masses.  There is no tenderness in his abdomen. There is no hepatosplenomegaly. There are no obvious focal neurological signs. He is moving all his limbs on command. He does not have any facial weakness.   Investigations:  Recent Results (from the past 240 hour(s))  MRSA PCR SCREENING     Status: None   Collection Time    06/17/12 11:15 PM      Result Value Range Status   MRSA by PCR NEGATIVE  NEGATIVE Final   Comment:            The GeneXpert MRSA Assay (FDA     approved for NASAL specimens     only), is one component of a     comprehensive MRSA colonization     surveillance program. It is not     intended to diagnose MRSA     infection nor to guide or     monitor treatment for     MRSA infections.     Basic Metabolic Panel:  Recent Labs  16/10/96 0511 06/22/12 0537 06/23/12 0558  NA  --  136 132*  K  --  4.0 3.3*  CL  --  106 97  CO2  --  24 25  GLUCOSE  --  103* 98  BUN  --  4* 6  CREATININE  --  0.61 0.59  CALCIUM  --  8.2* 9.1  MG 1.7  --   --    Liver Function Tests:  Recent Labs  06/22/12 0537 06/23/12 0558  AST 102* 112*  ALT 69* 83*  ALKPHOS 71  88  BILITOT 2.6* 2.8*  PROT 6.3 7.0  ALBUMIN 2.9* 3.2*     CBC:  Recent Labs  06/22/12 0537 06/23/12 0558  WBC 4.1 4.2  HGB 10.4* 11.3*  HCT 29.9* 32.7*  MCV 99.7 98.2  PLT 49* 75*        Medications: I have reviewed the patient's current medications.  Impression: 1. Alcohol withdrawal, resolved. 2. Alcoholism. 3. Dehydration, resolved. 4. Bruising left side of the trunk. CT scanning of the chest, abdomen does not show any hematoma. There are no fractures the daily. 5. Cirrhosis of the liver on CT scanning of the abdomen. Likely secondary to alcoholism. 6. Hypokalemia.      Plan: 1. Discontinue IV fluids. Replete potassium orally. 2. Reduce Ativan to 0.5 mg twice a day with when necessary Ativan as required. 3. Disposition-skilled nursing facility. Patient appears medically stable for discharge at this point in time. I do not believe he is capable of making his own decisions at the present time and we will need input from his  sister to make those decisions.     LOS: 6 days   Wilson Singer Pager [phone]  06/23/2012, 9:46 AM

## 2012-06-23 NOTE — Progress Notes (Signed)
Called report to Dennie Bible, nurse at [facility] [place].  Verbalized understanding.  Pt awaiting EMS to transport to facility.  Schonewitz, Candelaria Stagers 06/23/2012

## 2012-06-23 NOTE — Clinical Social Work Note (Signed)
Patient and sister given bed offers received - [facility], [facility] [place].  Given patient's questionable capacity to make own decisions, sister chose bed at [facility].  Sister asked to call admissions there to complete paperwork.  Sister agreeable to patient transfer to facility today if medically stable.  [facility] admissions notified.  Santa Genera, LCSW Clinical Social Worker [phone])

## 2012-06-23 NOTE — Discharge Summary (Signed)
Physician Discharge Summary  William Barrett:[id number] DOB: 27-Jan-1961 DOA: 06/17/2012  PCP: Eula Listen, MD  Admit date: 06/17/2012 Discharge date: 06/23/2012  Time spent: Greater than 30 minutes     Discharge Diagnoses:  1. Alcohol withdrawal, resolved. 2. Alcoholism. 3. Alcoholic cirrhosis of the liver. 4. Thrombocytopenia secondary to alcoholism and liver disease. 5. Probable developmental delay.   Discharge Condition: Stable.  Diet recommendation: Regular. No alcohol.  Filed Weights   06/17/12 2321 06/19/12 0500 06/20/12 0400  Weight: 73.7 kg (162 lb 7.7 oz) 72.7 kg (160 lb 4.4 oz) 73.1 kg (161 lb 2.5 oz)    History of present illness:  This 52 year old man presents to the hospital with symptoms of diarrhea and weakness. Please see initial history as outlined below: HPI: William Barrett is a 52 y.o. male with hypertension and severe chronic alcohol abuse presented to the emergency department complaining of 2-3 days of diarrhea, vomiting and decreased appetite. Family report that he has been weaker and has had multiple falls. His last drink of alcohol was the evening prior to admission. His BAC on arrival was 106, UDS negative. He drinks >18 beers a day and has done this daily for over 30 years. Labs obtained in ED suggest dehydration and acidosis and he had profound orthostatic hypotension, so admission was requested for evaluation and management.  Hospital Course:  Patient was initially hypotensive and clinically dehydrated and was given aggressive intravenous fluid hydration. He was admitted to the step down unit. Soon after this he went into full blown delirium tremens. He was therefore treated with protocol for alcohol withdrawal. He responded appropriately and now has been detoxified chemically. He appears to be back at his baseline which I think probably is someone who has developmental delay. He was evaluated by physical therapy, who did not feel he was safe to return  home and would require rehabilitation initially. He is now stable for discharge to a rehabilitation facility today.  Procedures:  None.   Consultations:  None.  Discharge Exam: Filed Vitals:   06/22/12 1444 06/22/12 2029 06/23/12 0412 06/23/12 0739  BP: 103/67 127/85 129/88   Pulse: 76 80 86   Temp: 98.4 F (36.9 C) 98.8 F (37.1 C) 97.9 F (36.6 C)   TempSrc: Oral Oral Oral   Resp: 18 20 20    Height:      Weight:      SpO2: 96% 100% 98% 96%    General: He looks systemically well. Cardiovascular: Heart sounds are present and in sinus rhythm. There are no murmurs or added sounds. Respiratory: Lung fields are clear. He is alert and appears to be orientated in place but not in time. There are no orders focal neurological signs.  Discharge Instructions  Discharge Orders   Future Orders Complete By Expires     Diet - low sodium heart healthy  As directed     Increase activity slowly  As directed         Medication List    TAKE these medications       alum & mag hydroxide-simeth 200-200-20 MG/5ML suspension  Commonly known as:  MAALOX/MYLANTA  Take 30 mLs by mouth every 6 (six) hours as needed.     folic acid 1 MG tablet  Commonly known as:  FOLVITE  Take 1 tablet (1 mg total) by mouth daily.     LORazepam 0.5 MG tablet  Commonly known as:  ATIVAN  Take 1 tablet (0.5 mg total) by mouth 2 (two)  times daily.     LORazepam 1 MG tablet  Commonly known as:  ATIVAN  Take 1 tablet (1 mg total) by mouth every 6 (six) hours as needed for anxiety.     multivitamin with minerals Tabs  Take 1 tablet by mouth daily.     thiamine 100 MG tablet  Take 1 tablet (100 mg total) by mouth daily.          The results of significant diagnostics from this hospitalization (including imaging, microbiology, ancillary and laboratory) are listed below for reference.    Significant Diagnostic Studies: Ct Head Wo Contrast  06/17/2012  *RADIOLOGY REPORT*  Clinical Data:  Trauma.   Larey Seat off porch 2 days ago.  Multiple areas of bruising.  CT HEAD WITHOUT CONTRAST CT CERVICAL SPINE WITHOUT CONTRAST  Technique:  Multidetector CT imaging of the head and cervical spine was performed following the standard protocol without intravenous contrast.  Multiplanar CT image reconstructions of the cervical spine were also generated.  Comparison:   None  CT HEAD  Findings: There is no intra or extra-axial fluid collection or mass lesion.  The basilar cisterns and ventricles have a normal appearance.  There is no CT evidence for acute infarction or hemorrhage.  Bone windows show no calvarial fracture.  Note is made of dense atherosclerotic calcification of the internal carotid arteries.  IMPRESSION:  1. No evidence for acute intracranial abnormality. 2.  Atherosclerotic calcification of the carotid arteries.  CT CERVICAL SPINE  Findings: There are moderate degenerative changes within the cervical spine.  These are most notable at C4-5, C5-6, and C6-7. There is a mild spinal stenosis at C4-5, related to degenerative changes.  There is no evidence for acute fracture or dislocation. Images of the lung apices are unremarkable. Note is made of atherosclerotic calcification of the common carotid arteries.  IMPRESSION:  1.  Degenerative changes. 2. No evidence for acute  abnormality.   Original Report Authenticated By: Norva Pavlov, M.D.    Ct Chest W Contrast  06/17/2012  *RADIOLOGY REPORT*  Clinical Data:  Trauma.  CT CHEST, ABDOMEN AND PELVIS WITH CONTRAST  Technique:  Multidetector CT imaging of the chest, abdomen and pelvis was performed following the standard protocol during bolus administration of intravenous contrast.  Contrast: OMNIPAQUE IOHEXOL 300 MG/ML  SOLN  Comparison:   None.  CT CHEST  Findings:  Lungs/pleura: There is no pleural effusion identified. No pneumothorax or pulmonary contusion identified.  There is no airspace consolidation or atelectasis noted.  There is a pulmonary nodule in  the left lower lobe measuring 4 mm, image 32/series 3.  Heart/Mediastinum: Heart size is normal.  There is no pericardial effusion.  There is no mediastinal or hilar adenopathy.  The aorta appears intact.  Bones/Musculoskeletal:  There is a fracture deformity involving the proximal body of the sternum.  This is age indeterminate.  No rib fractures are identified.  The thoracic spine appears intact.  IMPRESSION:  1.  No acute cardiopulmonary abnormalities. 2.  Age indeterminate fracture deformity involving the proximal body of the sternum.  Correlate for any focal tenderness in this area of peri  CT ABDOMEN AND PELVIS  Findings:  There is marked diffuse low attenuation throughout the liver parenchyma.  The liver has a slightly nodular contour.  No focal liver abnormality identified.  The gallbladder is normal.  No biliary dilatation.  The pancreas is unremarkable.  Normal appearance of the spleen.  The adrenal glands are both normal.  The kidneys both  appear slightly malrotated.  No acute renal abnormality noted.  The urinary bladder appears normal.  Normal caliber of the abdominal aorta.  There is no upper abdominal adenopathy identified.  No pelvic or inguinal adenopathy noted.  The stomach appears normal.  The small bowel loops are unremarkable.  Normal appearance of the colon.  Review of the visualized osseous structures is unremarkable for lytic or sclerotic lesion.  There is a first-degree anterolisthesis of L3 on L4 bilateral L3 pars defects are noted.  This is unchanged from 09/21/2008.  IMPRESSION:  1.  No acute findings identified. 2.  Chronic anterolisthesis of L3 on L4 with bilateral L4 pars defects. 3.  Cirrhosis and diffuse fatty infiltration of the liver.   Original Report Authenticated By: Signa Kell, M.D.    Ct Cervical Spine Wo Contrast  06/17/2012  *RADIOLOGY REPORT*  Clinical Data:  Trauma.  Larey Seat off porch 2 days ago.  Multiple areas of bruising.  CT HEAD WITHOUT CONTRAST CT CERVICAL SPINE  WITHOUT CONTRAST  Technique:  Multidetector CT imaging of the head and cervical spine was performed following the standard protocol without intravenous contrast.  Multiplanar CT image reconstructions of the cervical spine were also generated.  Comparison:   None  CT HEAD  Findings: There is no intra or extra-axial fluid collection or mass lesion.  The basilar cisterns and ventricles have a normal appearance.  There is no CT evidence for acute infarction or hemorrhage.  Bone windows show no calvarial fracture.  Note is made of dense atherosclerotic calcification of the internal carotid arteries.  IMPRESSION:  1. No evidence for acute intracranial abnormality. 2.  Atherosclerotic calcification of the carotid arteries.  CT CERVICAL SPINE  Findings: There are moderate degenerative changes within the cervical spine.  These are most notable at C4-5, C5-6, and C6-7. There is a mild spinal stenosis at C4-5, related to degenerative changes.  There is no evidence for acute fracture or dislocation. Images of the lung apices are unremarkable. Note is made of atherosclerotic calcification of the common carotid arteries.  IMPRESSION:  1.  Degenerative changes. 2. No evidence for acute  abnormality.   Original Report Authenticated By: Norva Pavlov, M.D.    Ct Abdomen Pelvis W Contrast  06/17/2012  *RADIOLOGY REPORT*  Clinical Data:  Trauma.  CT CHEST, ABDOMEN AND PELVIS WITH CONTRAST  Technique:  Multidetector CT imaging of the chest, abdomen and pelvis was performed following the standard protocol during bolus administration of intravenous contrast.  Contrast: OMNIPAQUE IOHEXOL 300 MG/ML  SOLN  Comparison:   None.  CT CHEST  Findings:  Lungs/pleura: There is no pleural effusion identified. No pneumothorax or pulmonary contusion identified.  There is no airspace consolidation or atelectasis noted.  There is a pulmonary nodule in the left lower lobe measuring 4 mm, image 32/series 3.  Heart/Mediastinum: Heart size is  normal.  There is no pericardial effusion.  There is no mediastinal or hilar adenopathy.  The aorta appears intact.  Bones/Musculoskeletal:  There is a fracture deformity involving the proximal body of the sternum.  This is age indeterminate.  No rib fractures are identified.  The thoracic spine appears intact.  IMPRESSION:  1.  No acute cardiopulmonary abnormalities. 2.  Age indeterminate fracture deformity involving the proximal body of the sternum.  Correlate for any focal tenderness in this area of peri  CT ABDOMEN AND PELVIS  Findings:  There is marked diffuse low attenuation throughout the liver parenchyma.  The liver has a slightly nodular contour.  No focal liver abnormality identified.  The gallbladder is normal.  No biliary dilatation.  The pancreas is unremarkable.  Normal appearance of the spleen.  The adrenal glands are both normal.  The kidneys both appear slightly malrotated.  No acute renal abnormality noted.  The urinary bladder appears normal.  Normal caliber of the abdominal aorta.  There is no upper abdominal adenopathy identified.  No pelvic or inguinal adenopathy noted.  The stomach appears normal.  The small bowel loops are unremarkable.  Normal appearance of the colon.  Review of the visualized osseous structures is unremarkable for lytic or sclerotic lesion.  There is a first-degree anterolisthesis of L3 on L4 bilateral L3 pars defects are noted.  This is unchanged from 09/21/2008.  IMPRESSION:  1.  No acute findings identified. 2.  Chronic anterolisthesis of L3 on L4 with bilateral L4 pars defects. 3.  Cirrhosis and diffuse fatty infiltration of the liver.   Original Report Authenticated By: Signa Kell, M.D.     Microbiology: Recent Results (from the past 240 hour(s))  MRSA PCR SCREENING     Status: None   Collection Time    06/17/12 11:15 PM      Result Value Range Status   MRSA by PCR NEGATIVE  NEGATIVE Final   Comment:            The GeneXpert MRSA Assay (FDA     approved  for NASAL specimens     only), is one component of a     comprehensive MRSA colonization     surveillance program. It is not     intended to diagnose MRSA     infection nor to guide or     monitor treatment for     MRSA infections.     Labs: Basic Metabolic Panel:  Recent Labs Lab 06/17/12 1900 06/18/12 0518 06/21/12 0428 06/21/12 0511 06/22/12 0537 06/23/12 0558  NA 124* 131* 134*  --  136 132*  K 3.2* 3.7 2.4*  --  4.0 3.3*  CL 81* 93* 99  --  106 97  CO2 20 23 24   --  24 25  GLUCOSE 86 67* 120*  --  103* 98  BUN 7 9 4*  --  4* 6  CREATININE 0.60 0.75 0.59  --  0.61 0.59  CALCIUM 9.2 8.0* 8.7  --  8.2* 9.1  MG 1.8  --   --  1.7  --   --    Liver Function Tests:  Recent Labs Lab 06/17/12 1900 06/21/12 0428 06/22/12 0537 06/23/12 0558  AST 190* 133* 102* 112*  ALT 85* 80* 69* 83*  ALKPHOS 94 83 71 88  BILITOT 3.3* 3.1* 2.6* 2.8*  PROT 8.1 6.9 6.3 7.0  ALBUMIN 4.0 3.4* 2.9* 3.2*    Recent Labs Lab 06/18/12 0518  LIPASE 32    CBC:  Recent Labs Lab 06/17/12 1900 06/18/12 0518 06/21/12 0428 06/22/12 0537 06/23/12 0558  WBC 4.9 4.8 4.3 4.1 4.2  NEUTROABS 3.4  --   --   --   --   HGB 13.1 11.5* 11.0* 10.4* 11.3*  HCT 36.1* 32.2* 31.8* 29.9* 32.7*  MCV 94.5 95.5 97.0 99.7 98.2  PLT 14* 13* 41* 49* 75*   Cardiac Enzymes:  Recent Labs Lab 06/18/12 0518 06/21/12 0428  CKTOTAL  --  760*  TROPONINI <0.30  --    BNP: BNP (last 3 results)  Recent Labs  06/17/12 1900  PROBNP 1470.0*  SignedWilson Singer  [facility] 06/23/2012, 11:05 AM

## 2012-12-27 ENCOUNTER — Other Ambulatory Visit: Payer: Medicare Other

## 2012-12-27 DIAGNOSIS — R0989 Other specified symptoms and signs involving the circulatory and respiratory systems: Secondary | ICD-10-CM

## 2012-12-30 ENCOUNTER — Other Ambulatory Visit: Payer: Self-pay

## 2012-12-30 ENCOUNTER — Ambulatory Visit (INDEPENDENT_AMBULATORY_CARE_PROVIDER_SITE_OTHER): Payer: Medicare Other | Admitting: Cardiovascular Disease

## 2012-12-30 ENCOUNTER — Encounter: Payer: Self-pay | Admitting: Cardiovascular Disease

## 2012-12-30 ENCOUNTER — Other Ambulatory Visit (INDEPENDENT_AMBULATORY_CARE_PROVIDER_SITE_OTHER): Payer: Medicare Other | Admitting: Cardiology

## 2012-12-30 ENCOUNTER — Ambulatory Visit: Payer: Medicare Other | Admitting: Cardiology

## 2012-12-30 VITALS — BP 122/90 | HR 69 | Ht 68.0 in | Wt 163.0 lb

## 2012-12-30 DIAGNOSIS — R0602 Shortness of breath: Secondary | ICD-10-CM

## 2012-12-30 DIAGNOSIS — I059 Rheumatic mitral valve disease, unspecified: Secondary | ICD-10-CM

## 2012-12-30 DIAGNOSIS — I359 Nonrheumatic aortic valve disorder, unspecified: Secondary | ICD-10-CM

## 2012-12-30 DIAGNOSIS — R011 Cardiac murmur, unspecified: Secondary | ICD-10-CM

## 2012-12-30 DIAGNOSIS — R000 Tachycardia, unspecified: Secondary | ICD-10-CM

## 2012-12-30 DIAGNOSIS — K7460 Unspecified cirrhosis of liver: Secondary | ICD-10-CM

## 2012-12-30 DIAGNOSIS — F17200 Nicotine dependence, unspecified, uncomplicated: Secondary | ICD-10-CM | POA: Insufficient documentation

## 2012-12-30 DIAGNOSIS — F1010 Alcohol abuse, uncomplicated: Secondary | ICD-10-CM

## 2012-12-30 NOTE — Assessment & Plan Note (Signed)
Recommended alcohol cessation. 

## 2012-12-30 NOTE — Assessment & Plan Note (Signed)
Heart rate appears to have improved on propranolol twice a day. We have suggested he stay on current medications.

## 2012-12-30 NOTE — Progress Notes (Signed)
Patient ID: William Barrett, male    DOB: [birth year], 52 y.o.   MRN: [id number]  HPI Comments: William Barrett is a 52 year old gentleman with a long alcohol history from age 27 to current who drinks up to 3 bottles of  40 ounce beers per day, also with long smoking history starting at age 52 who continues to smoke up to 3 packs per day who presents by referral for shortness of breath, murmur. He presents today with his girlfriend and their son.  He reports that overall he feels well. He does report having occasional episodes of shortness of breath. He denies any lower extremity edema, no orthopnea or PND, no cough. He reports having cirrhosis. Wife also drinks. He denies any prior cardiac history. He reports that he is relatively functional, able to do jobs around the house, able to walk without having to stop.  Notes indicate a history of tachycardia and metoprolol was changed to propranolol 40 mg twice a day. He reports that he is handling this well  Recent lab work shows normal creatinine, potassium 3.6, sodium 140, hematocrit 51  Prior admission last year for acute hypertension August 2013. He was in the hospital 11/11/2011. Found to have hyponatremia, acute renal failure, hypokalemia, hypomagnesemia and alcoholic liver disease.  EKG shows normal sinus rhythm with rate 69 beats per minute, poor R-wave progression through the anterior precordial leads      Outpatient Encounter Prescriptions as of 12/30/2012  Medication Sig Dispense Refill  . albuterol (PROVENTIL HFA;VENTOLIN HFA) 108 (90 BASE) MCG/ACT inhaler Inhale 2 puffs into the lungs every 4 (four) hours as needed for wheezing.       Marland Kitchen alum & mag hydroxide-simeth (MAALOX/MYLANTA) 200-200-20 MG/5ML suspension Take 30 mLs by mouth every 6 (six) hours as needed.  355 mL    . folic acid (FOLVITE) 1 MG tablet Take 1 tablet (1 mg total) by mouth daily.      . Multiple Vitamin (MULTIVITAMIN WITH MINERALS) TABS Take 1 tablet by mouth  daily.      . propranolol (INDERAL) 40 MG tablet Take 40 mg by mouth 2 (two) times daily.      . [DISCONTINUED] thiamine 100 MG tablet Take 1 tablet (100 mg total) by mouth daily.      . [DISCONTINUED] thiamine 100 MG tablet Take 50 mg by mouth daily.      . [DISCONTINUED] LORazepam (ATIVAN) 0.5 MG tablet Take 1 tablet (0.5 mg total) by mouth 2 (two) times daily.  30 tablet  0  . [DISCONTINUED] LORazepam (ATIVAN) 1 MG tablet Take 1 tablet (1 mg total) by mouth every 6 (six) hours as needed for anxiety.  30 tablet  0   No facility-administered encounter medications on file as of 12/30/2012.     Review of Systems  Constitutional: Negative.   HENT: Negative.   Eyes: Negative.   Respiratory: Positive for shortness of breath.   Cardiovascular: Negative.   Gastrointestinal: Negative.   Endocrine: Negative.   Musculoskeletal: Negative.   Skin: Negative.   Allergic/Immunologic: Negative.   Neurological: Negative.   Hematological: Negative.   Psychiatric/Behavioral: Negative.   All other systems reviewed and are negative.    BP 122/90  Pulse 69  Ht 5\' 8"  (1.727 m)  Wt 163 lb (73.936 kg)  BMI 24.79 kg/m2  Physical Exam  Nursing note and vitals reviewed. Constitutional: He is oriented to person, place, and time. He appears well-developed and well-nourished.  HENT:  Head: Normocephalic.  Nose: Nose normal.  Mouth/Throat: Oropharynx is clear and moist.  Eyes: Conjunctivae are normal. Pupils are equal, round, and reactive to light.  Neck: Normal range of motion. Neck supple. No JVD present.  Cardiovascular: Normal rate, regular rhythm, S1 normal, S2 normal, normal heart sounds and intact distal pulses.  Exam reveals no gallop and no friction rub.   No murmur heard. Pulmonary/Chest: Effort normal and breath sounds normal. No respiratory distress. He has no wheezes. He has no rales. He exhibits no tenderness.  Abdominal: Soft. Bowel sounds are normal. He exhibits no distension. There  is no tenderness.  Musculoskeletal: Normal range of motion. He exhibits no edema and no tenderness.  Lymphadenopathy:    He has no cervical adenopathy.  Neurological: He is alert and oriented to person, place, and time. Coordination normal.  Skin: Skin is warm and dry. No rash noted. No erythema.  Psychiatric: He has a normal mood and affect. His behavior is normal. Judgment and thought content normal.      Assessment and Plan

## 2012-12-30 NOTE — Assessment & Plan Note (Addendum)
Etiology of his rare shortness of breath episodes is unclear. Unable to exclude arrhythmia as a cause of his symptoms. Currently in normal sinus rhythm on propranolol. We will order an echocardiogram today to evaluate LV function and right ventricular systolic pressures. We have suggested a cause if he continues to have episodes of shortness of breath.  Preliminary read on his echocardiogram shows normal ejection fraction.

## 2012-12-30 NOTE — Assessment & Plan Note (Signed)
Recommended alcohol cessation. Currently drinks large volumes daily over the past 20 years

## 2012-12-30 NOTE — Assessment & Plan Note (Signed)
Recommended smoking cessation. Likely has mild COPD

## 2012-12-30 NOTE — Patient Instructions (Signed)
You are doing well. No medication changes were made.  Please call us if you have new issues that need to be addressed before your next appt.    

## 2013-01-03 ENCOUNTER — Other Ambulatory Visit: Payer: Medicare Other

## 2013-01-04 ENCOUNTER — Encounter: Payer: Self-pay | Admitting: *Deleted

## 2013-09-16 ENCOUNTER — Emergency Department (HOSPITAL_COMMUNITY)
Admission: EM | Admit: 2013-09-16 | Discharge: 2013-09-16 | Disposition: A | Discharge status: Home / Self Care | Payer: Medicare Other | Attending: Emergency Medicine | Admitting: Emergency Medicine

## 2013-09-16 ENCOUNTER — Encounter (HOSPITAL_COMMUNITY): Payer: Self-pay | Admitting: Emergency Medicine

## 2013-09-16 DIAGNOSIS — W57XXXA Bitten or stung by nonvenomous insect and other nonvenomous arthropods, initial encounter: Secondary | ICD-10-CM

## 2013-09-16 DIAGNOSIS — R011 Cardiac murmur, unspecified: Secondary | ICD-10-CM | POA: Insufficient documentation

## 2013-09-16 DIAGNOSIS — Y939 Activity, unspecified: Secondary | ICD-10-CM | POA: Insufficient documentation

## 2013-09-16 DIAGNOSIS — F17200 Nicotine dependence, unspecified, uncomplicated: Secondary | ICD-10-CM | POA: Insufficient documentation

## 2013-09-16 DIAGNOSIS — D649 Anemia, unspecified: Secondary | ICD-10-CM | POA: Insufficient documentation

## 2013-09-16 DIAGNOSIS — R000 Tachycardia, unspecified: Secondary | ICD-10-CM | POA: Insufficient documentation

## 2013-09-16 DIAGNOSIS — Y929 Unspecified place or not applicable: Secondary | ICD-10-CM | POA: Insufficient documentation

## 2013-09-16 DIAGNOSIS — S00209A Unspecified superficial injury of unspecified eyelid and periocular area, initial encounter: Secondary | ICD-10-CM | POA: Insufficient documentation

## 2013-09-16 DIAGNOSIS — Z79899 Other long term (current) drug therapy: Secondary | ICD-10-CM | POA: Insufficient documentation

## 2013-09-16 DIAGNOSIS — I10 Essential (primary) hypertension: Secondary | ICD-10-CM | POA: Insufficient documentation

## 2013-09-16 NOTE — ED Notes (Signed)
Pt has a tick on his left eyelid, states first noticed it this am, no pain

## 2013-09-16 NOTE — ED Provider Notes (Signed)
CSN: [id number]     Arrival date & time 09/16/13  0057 History   First MD Initiated Contact with Patient 09/16/13 0105     No chief complaint on file.    (Consider location/radiation/quality/duration/timing/severity/associated sxs/prior Treatment) HPI 53 y.o. Male presents complaining of tick on left eyelid.  He states noted earlier today.  No pain or injury to eye.  Did not try to remove by himself due to proximity to eye.   Past Medical History  Diagnosis Date  . Hypertension   . Alcoholism   . Anemia   . Thrombocytopenia   . Undiagnosed cardiac murmurs   . Tachycardia, unspecified    History reviewed. No pertinent past surgical history. Family History  Problem Relation Age of Onset  . Hypertension Mother   . Hypertension Father    History  Substance Use Topics  . Smoking status: Current Every Day Smoker -- 3.00 packs/day for 25 years    Types: Cigarettes  . Smokeless tobacco: Not on file  . Alcohol Use: 10.8 oz/week    18 Cans of beer per week    Review of Systems  All other systems reviewed and are negative.     Allergies  Review of patient's allergies indicates no known allergies.  Home Medications   Prior to Admission medications   Medication Sig Start Date End Date Taking? Authorizing Provider  albuterol (PROVENTIL HFA;VENTOLIN HFA) 108 (90 BASE) MCG/ACT inhaler Inhale 2 puffs into the lungs every 4 (four) hours as needed for wheezing.    Yes Historical Provider, MD  propranolol (INDERAL) 40 MG tablet Take 40 mg by mouth 2 (two) times daily.   Yes Historical Provider, MD  alum & mag hydroxide-simeth (MAALOX/MYLANTA) 200-200-20 MG/5ML suspension Take 30 mLs by mouth every 6 (six) hours as needed. 06/23/12   Nimish Luther Parody, MD  folic acid (FOLVITE) 1 MG tablet Take 1 tablet (1 mg total) by mouth daily. 06/23/12   Doree Albee, MD  Multiple Vitamin (MULTIVITAMIN WITH MINERALS) TABS Take 1 tablet by mouth daily. 06/23/12   Nimish C Anastasio Champion, MD   BP 150/73   Pulse 107  Temp(Src) 98.4 F (36.9 C) (Oral)  Resp 18  Ht 5\' 8"  (1.727 m)  Wt 162 lb (73.483 kg)  BMI 24.64 kg/m2  SpO2 97% Physical Exam  Nursing note and vitals reviewed. Constitutional: He appears well-developed and well-nourished.  HENT:  Head: Normocephalic and atraumatic.  Eyes: Pupils are equal, round, and reactive to light.    Tick attache left upper eye lid margin    ED Course  Procedures (including critical care time) Labs Review Labs Reviewed - No data to display  Imaging Review No results found.   EKG Interpretation None      MDM   Final diagnoses:  Tick bite    Tick removed by grasping head with pickups.  Head intact after removal.  Discussed return precautions with patient and voices understanding.     Shaune Pollack, MD 09/16/13 [phone]

## 2013-09-16 NOTE — Discharge Instructions (Signed)
Tick Bite Information Ticks are insects that attach themselves to the skin. There are many types of ticks. Common types include wood ticks and deer ticks. Sometimes, ticks carry diseases that can make a person very ill. The most common places for ticks to attach themselves are the scalp, neck, armpits, waist, and groin.  HOW CAN YOU PREVENT TICK BITES? Take these steps to help prevent tick bites when you are outdoors:  Wear long sleeves and long pants.  Wear white clothes so you can see ticks more easily.  Tuck your pant legs into your socks.  If walking on a trail, stay in the middle of the trail to avoid brushing against bushes.  Avoid walking through areas with long grass.  Put bug spray on all skin that is showing and along boot tops, pant legs, and sleeve cuffs.  Check clothes, hair, and skin often and before going inside.  Brush off any ticks that are not attached.  Take a shower or bath as soon as possible after being outdoors. HOW SHOULD YOU REMOVE A TICK? Ticks should be removed as soon as possible to help prevent diseases. 1. If latex gloves are available, put them on before trying to remove a tick. 2. Use tweezers to grasp the tick as close to the skin as possible. You may also use curved forceps or a tick removal tool. Grasp the tick as close to its head as possible. Avoid grasping the tick on its body. 3. Pull gently upward until the tick lets go. Do not twist the tick or jerk it suddenly. This may break off the tick's head or mouth parts. 4. Do not squeeze or crush the tick's body. This could force disease-carrying fluids from the tick into your body. 5. After the tick is removed, wash the bite area and your hands with soap and water or alcohol. 6. Apply a small amount of antiseptic cream or ointment to the bite site. 7. Wash any tools that were used. Do not try to remove a tick by applying a hot match, petroleum jelly, or fingernail polish to the tick. These methods do  not work. They may also increase the chances of disease being spread from the tick bite. WHEN SHOULD YOU SEEK HELP? Contact your health care provider if you are unable to remove a tick or if a part of the tick breaks off in the skin. After a tick bite, you need to watch for signs and symptoms of diseases that can be spread by ticks. Contact your health care provider if you develop any of the following:  Fever.  Rash.  Redness and puffiness (swelling) in the area of the tick bite.  Tender, puffy lymph glands.  Watery poop (diarrhea).  Weight loss.  Cough.  Feeling more tired than normal (fatigue).  Muscle, joint, or bone pain.  Belly (abdominal) pain.  Headache.  Change in your level of consciousness.  Trouble walking or moving your legs.  Loss of feeling (numbness) in the legs.  Loss of movement (paralysis).  Shortness of breath.  Confusion.  Throwing up (vomiting) many times. Document Released: 05/27/2009 Document Revised: 11/02/2012 Document Reviewed: 08/10/2012 Exit[employer] Patient Information 2015 [employer]. This information is not intended to replace advice given to you by your health care provider. Make sure you discuss any questions you have with your health care provider.  

## 2013-11-04 ENCOUNTER — Encounter (HOSPITAL_COMMUNITY): Payer: Self-pay | Admitting: Emergency Medicine

## 2013-11-04 ENCOUNTER — Emergency Department (HOSPITAL_COMMUNITY)
Admission: EM | Admit: 2013-11-04 | Discharge: 2013-11-04 | Disposition: A | Discharge status: Home / Self Care | Payer: Medicare Other | Attending: Emergency Medicine | Admitting: Emergency Medicine

## 2013-11-04 DIAGNOSIS — R011 Cardiac murmur, unspecified: Secondary | ICD-10-CM | POA: Insufficient documentation

## 2013-11-04 DIAGNOSIS — S90569A Insect bite (nonvenomous), unspecified ankle, initial encounter: Secondary | ICD-10-CM | POA: Diagnosis not present

## 2013-11-04 DIAGNOSIS — S30860A Insect bite (nonvenomous) of lower back and pelvis, initial encounter: Secondary | ICD-10-CM | POA: Diagnosis not present

## 2013-11-04 DIAGNOSIS — W57XXXA Bitten or stung by nonvenomous insect and other nonvenomous arthropods, initial encounter: Secondary | ICD-10-CM | POA: Insufficient documentation

## 2013-11-04 DIAGNOSIS — Z862 Personal history of diseases of the blood and blood-forming organs and certain disorders involving the immune mechanism: Secondary | ICD-10-CM | POA: Insufficient documentation

## 2013-11-04 DIAGNOSIS — F17200 Nicotine dependence, unspecified, uncomplicated: Secondary | ICD-10-CM | POA: Diagnosis not present

## 2013-11-04 DIAGNOSIS — Y939 Activity, unspecified: Secondary | ICD-10-CM | POA: Diagnosis not present

## 2013-11-04 DIAGNOSIS — Z79899 Other long term (current) drug therapy: Secondary | ICD-10-CM | POA: Insufficient documentation

## 2013-11-04 DIAGNOSIS — Y929 Unspecified place or not applicable: Secondary | ICD-10-CM | POA: Insufficient documentation

## 2013-11-04 DIAGNOSIS — I10 Essential (primary) hypertension: Secondary | ICD-10-CM | POA: Insufficient documentation

## 2013-11-04 MED ORDER — DOXYCYCLINE HYCLATE 100 MG PO CAPS: 100.0000 mg | ORAL_CAPSULE | Freq: Two times a day (BID) | ORAL | Status: DC

## 2013-11-04 NOTE — ED Provider Notes (Signed)
CSN: [id number]     Arrival date & time 11/04/13  1219 History   First MD Initiated Contact with Patient 11/04/13 1251     Chief Complaint  Patient presents with  . Tick Removal     (Consider location/radiation/quality/duration/timing/severity/associated sxs/prior Treatment) The history is provided by the patient.   William Barrett is a 53 y.o. male who presents to the ED with multiple tick bites. He has not removed the ticks. They are located on his legs, back, abdomen and scrotal area. He is unsure how long they have been there. Some are larger than others. He was sitting under a tree a few days ago and thinks he got them then. He denies fever or chills but does have redness around some of the bites.   Past Medical History  Diagnosis Date  . Hypertension   . Alcoholism   . Anemia   . Thrombocytopenia   . Undiagnosed cardiac murmurs   . Tachycardia, unspecified    History reviewed. No pertinent past surgical history. Family History  Problem Relation Age of Onset  . Hypertension Mother   . Hypertension Father    History  Substance Use Topics  . Smoking status: Current Every Day Smoker -- 3.00 packs/day for 25 years    Types: Cigarettes  . Smokeless tobacco: Not on file  . Alcohol Use: 10.8 oz/week    18 Cans of beer per week    Review of Systems Negative except as stated in HPI   Allergies  Review of patient's allergies indicates no known allergies.  Home Medications   Prior to Admission medications   Medication Sig Start Date End Date Taking? Authorizing Provider  albuterol (PROVENTIL HFA;VENTOLIN HFA) 108 (90 BASE) MCG/ACT inhaler Inhale 2 puffs into the lungs every 4 (four) hours as needed for wheezing.    Yes Historical Provider, MD  amLODipine (NORVASC) 10 MG tablet Take 10 mg by mouth daily.   Yes Historical Provider, MD  metoprolol tartrate (LOPRESSOR) 25 MG tablet Take 25 mg by mouth 2 (two) times daily.   Yes Historical Provider, MD   BP 134/78  Pulse  96  Temp(Src) 98.8 F (37.1 C) (Oral)  Resp 20  Ht 5\' 6"  (1.676 m)  Wt 162 lb (73.483 kg)  BMI 26.16 kg/m2  SpO2 95% Physical Exam  Constitutional: He appears well-developed and well-nourished. No distress.  HENT:  Head: Normocephalic.  Eyes: Conjunctivae and EOM are normal.  Neck: Neck supple.  Cardiovascular: Normal rate.   Pulmonary/Chest: Effort normal.  Abdominal:    Genitourinary:     Tick bites  Musculoskeletal: Normal range of motion.  Skin: There is erythema (surrounding areas where ticks were attached).  Psychiatric: He has a normal mood and affect. His behavior is normal.    ED Course  Procedures Using pick ups ticks x 8 removed without difficulty.   MDM  53 y.o. male with multiple tick bites. Stable for discharge without fever or headache. Will start antibiotics and he will return for any problems. Discussed with the patient and all questioned fully answered.    Medication List    TAKE these medications       doxycycline 100 MG capsule  Commonly known as:  VIBRAMYCIN  Take 1 capsule (100 mg total) by mouth 2 (two) times daily.      ASK your doctor about these medications       albuterol 108 (90 BASE) MCG/ACT inhaler  Commonly known as:  PROVENTIL HFA;VENTOLIN HFA  Inhale 2  puffs into the lungs every 4 (four) hours as needed for wheezing.     amLODipine 10 MG tablet  Commonly known as:  NORVASC  Take 10 mg by mouth daily.     metoprolol tartrate 25 MG tablet  Commonly known as:  LOPRESSOR  Take 25 mg by mouth 2 (two) times daily.          [place], [place] 11/05/13 1729

## 2013-11-04 NOTE — ED Notes (Signed)
Pt c/o two ticks in groin area that needs to be removed, also has rash to back of leg and buttock region,, unsure of how long the rash or ticks have been there.

## 2013-11-07 NOTE — ED Provider Notes (Signed)
Medical screening examination/treatment/procedure(s) were performed by non-physician practitioner and as supervising physician I was immediately available for consultation/collaboration.   EKG Interpretation None       Rick Warnick R. Carlye Panameno, MD 11/07/13 1326 

## 2014-01-29 ENCOUNTER — Encounter: Payer: Self-pay | Admitting: Internal Medicine

## 2014-03-05 ENCOUNTER — Other Ambulatory Visit: Payer: Self-pay

## 2014-03-05 ENCOUNTER — Encounter: Payer: Self-pay | Admitting: Gastroenterology

## 2014-03-05 ENCOUNTER — Telehealth: Payer: Self-pay

## 2014-03-05 ENCOUNTER — Ambulatory Visit (INDEPENDENT_AMBULATORY_CARE_PROVIDER_SITE_OTHER): Payer: Medicare Other | Admitting: Gastroenterology

## 2014-03-05 VITALS — BP 144/92 | HR 84 | Temp 97.2°F | Ht 68.0 in | Wt 167.0 lb

## 2014-03-05 DIAGNOSIS — K7030 Alcoholic cirrhosis of liver without ascites: Secondary | ICD-10-CM

## 2014-03-05 DIAGNOSIS — Z1211 Encounter for screening for malignant neoplasm of colon: Secondary | ICD-10-CM

## 2014-03-05 DIAGNOSIS — K279 Peptic ulcer, site unspecified, unspecified as acute or chronic, without hemorrhage or perforation: Secondary | ICD-10-CM

## 2014-03-05 DIAGNOSIS — D696 Thrombocytopenia, unspecified: Secondary | ICD-10-CM

## 2014-03-05 LAB — PROTIME-INR
INR: 1.04 (ref <1.50)
Prothrombin Time: 13.6 seconds (ref 11.6–15.2)

## 2014-03-05 MED ORDER — PEG 3350-KCL-NA BICARB-NACL 420 G PO SOLR: 4000.0000 mL | ORAL | Status: DC

## 2014-03-05 NOTE — Telephone Encounter (Signed)
I called [facility] and the pt had already had his labs drawn today. I called Customer Service @ [phone] and spoke to [place] who is putting a note for the Hepatitis B surface antibody and the Hepatitis A antibody, total to be added to the blood work drawn today.  The specimen has not been received yet, but have a note and will add or they will call if there is a problem.

## 2014-03-05 NOTE — Assessment & Plan Note (Signed)
Cirrhosis likely due to etoh abuse. Will rule out hemochromatosis (previously elevated ferritin but in setting of acute etoh hepatitis). He needs esophageal varices screening. Will check current labs to determine MELD, check Hep B and A immune status. He will require updated abd u/s for hepatoma screening. EGD in near future with deep sedation due to chronic etoh abuse.  I have discussed the risks, alternatives, benefits with regards to but not limited to the risk of reaction to medication, bleeding, infection, perforation and the patient is agreeable to proceed. Written consent to be obtained.   Discussed need to continue complete abstinence from etoh use.

## 2014-03-05 NOTE — Assessment & Plan Note (Signed)
Plan for first ever colonoscopy for screening purposes. Due to h/o chronic etoh abuse, he will require deep sedation.  I have discussed the risks, alternatives, benefits with regards to but not limited to the risk of reaction to medication, bleeding, infection, perforation and the patient is agreeable to proceed. Written consent to be obtained.  Update CBC to recheck current platelet counts.

## 2014-03-05 NOTE — Progress Notes (Signed)
Primary Care Physician:  [name], MD  Primary Gastroenterologist:  Garfield Cornea, MD   Chief Complaint  Patient presents with  . Cirrhosis    HPI:  William Barrett is a 53 y.o. male here for further evaluation of thrombocytopenia and cirrhosis at the request of PCP. Patient previously seen by Dr. [name], last time in July 2010 at time of EGD which was done for profound microcytic anemia requiring transfusion. He was inpatient at this time. He had distal esophageal erosions, 2 benign-appearing prepyloric ulcers. Plans were to bring him back in 12 weeks for repeat EGD and colonoscopy but this did not happen for whatever reason. During the hospitalization he did have a CT of the abdomen and pelvis without contrast that was suggestive of cirrhosis. Latest imaging I have available is CT of abdomen and pelvis with contrast in April 2014 done during ER evaluation for trauma. There was a fracture of the sternum, age indeterminate. Marked diffuse low attenuation throughout the liver parenchyma with slightly nodular contour consistent with cirrhosis. Normal appearance of the spleen at that time. Patient was admitted at that time due to his alcohol abuse.  Patient presents without complaints. Girlfriend is present. They both confirm that he has not had any etoh since 10/2013. He has very little understanding of his cirrhosis. Denies pruritis, abdominal pain, vomiting, weight loss, constipation, diarrhea, melena, brbpr. No heartburn or dysphagia.    Lab Results  Component Value Date   WBC 4.2 06/23/2012   HGB 11.3* 06/23/2012   HCT 32.7* 06/23/2012   MCV 98.2 06/23/2012   PLT 75* 06/23/2012   Lab Results  Component Value Date   ALT 83* 06/23/2012   AST 112* 06/23/2012   ALKPHOS 88 06/23/2012   BILITOT 2.8* 06/23/2012      Current Outpatient Prescriptions  Medication Sig Dispense Refill  . albuterol (PROVENTIL HFA;VENTOLIN HFA) 108 (90 BASE) MCG/ACT inhaler Inhale 2 puffs into the  lungs every 4 (four) hours as needed for wheezing.     Marland Kitchen amLODipine (NORVASC) 10 MG tablet Take 10 mg by mouth daily.    . metoprolol tartrate (LOPRESSOR) 25 MG tablet Take 25 mg by mouth 2 (two) times daily.     No current facility-administered medications for this visit.    Allergies as of 03/05/2014  . (No Known Allergies)    Past Medical History  Diagnosis Date  . Hypertension   . Alcoholism   . Anemia   . Thrombocytopenia   . Undiagnosed cardiac murmurs   . Tachycardia, unspecified     Past Surgical History  Procedure Laterality Date  . Esophagogastroduodenoscopy  09/21/2008    RMR: Distal esophageal erosions consistent with erosive reflux esophagitis.Two benign-appearing prepyloric ulcers, otherwise unremarkable/small HH    Family History  Problem Relation Age of Onset  . Hypertension Mother   . Hypertension Father   . Liver disease Neg Hx   . Colon cancer Neg Hx     History   Social History  . Marital Status: Single    Spouse Name: N/A    Number of Children: 0  . Years of Education: N/A   Occupational History  . disability    Social History Main Topics  . Smoking status: Current Every Day Smoker -- 3.00 packs/day for 25 years    Types: Cigarettes  . Smokeless tobacco: Not on file  . Alcohol Use: 10.8 oz/week    18 Cans of beer per week     Comment: None since 10/2013. Before drinking  3 forty ounces daily since again 35.  . Drug Use: No  . Sexual Activity: Not Currently   Other Topics Concern  . Not on file   Social History Narrative      ROS:  General: Negative for anorexia, weight loss, fever, chills, fatigue, weakness. Eyes: Negative for vision changes.  ENT: Negative for hoarseness, difficulty swallowing , nasal congestion. CV: Negative for chest pain, angina, palpitations, dyspnea on exertion, peripheral edema.  Respiratory: Negative for dyspnea at rest, dyspnea on exertion, cough, sputum, wheezing.  GI: See history of present  illness. GU:  Negative for dysuria, hematuria, urinary incontinence, urinary frequency, nocturnal urination.  MS: Negative for joint pain, low back pain.  Derm: Negative for rash or itching.  Neuro: Negative for weakness, abnormal sensation, seizure, frequent headaches, memory loss, confusion.  Psych: Negative for anxiety, depression, suicidal ideation, hallucinations.  Endo: Negative for unusual weight change.  Heme: Negative for bruising or bleeding. Allergy: Negative for rash or hives.    Physical Examination:  BP 144/92 mmHg  Pulse 84  Temp(Src) 97.2 F (36.2 C)  Ht 5\' 8"  (1.727 m)  Wt 167 lb (75.751 kg)  BMI 25.40 kg/m2   General: Well-nourished, well-developed in no acute distress.  Head: Normocephalic, atraumatic.   Eyes: Conjunctiva pink, no icterus. Mouth: Oropharyngeal mucosa moist and pink , no lesions erythema or exudate. Neck: Supple without thyromegaly, masses, or lymphadenopathy.  Lungs: Clear to auscultation bilaterally.  Heart: Regular rate and rhythm, no murmurs rubs or gallops.  Abdomen: Bowel sounds are normal,liver edge palpated 6-7 fingerbreadths below the lower rib margin in the mid-clavicular line, nontender, nondistended, + splenomegaly,  no abdominal bruits or    hernia , no rebound or guarding.   Rectal: not performed Extremities: Trace lower extremity edema. No clubbing or deformities.  Neuro: Alert and oriented x 4 , grossly normal neurologically.  Skin: Warm and dry, no rash or jaundice.   Psych: Alert and cooperative, normal mood and affect.  Labs: See hpi  Imaging Studies: No results found.

## 2014-03-05 NOTE — Patient Instructions (Signed)
1. Please have your labs done today. 2. We have scheduled you for a colonoscopy and upper endoscopy with Dr. [name]. Please see separate instructions.

## 2014-03-05 NOTE — Assessment & Plan Note (Signed)
H/O PUD in 2010 when patient presented with transfusion dependent IDA. Currently denies NSAIDs/ASA, etoh. Clinically no recurrent PUD.

## 2014-03-05 NOTE — Progress Notes (Signed)
Patient needs abd u/s for hepatoma surveillance.  Patient also needs Hep B surf Ab and Hep A total Ab. Orders placed. Please make sure labs is aware that I added these.

## 2014-03-06 LAB — COMPREHENSIVE METABOLIC PANEL
ALT: 12 U/L (ref 0–53)
AST: 14 U/L (ref 0–37)
Albumin: 4.2 g/dL (ref 3.5–5.2)
Alkaline Phosphatase: 100 U/L (ref 39–117)
BUN: 18 mg/dL (ref 6–23)
CO2: 31 mEq/L (ref 19–32)
Calcium: 9.5 mg/dL (ref 8.4–10.5)
Chloride: 102 mEq/L (ref 96–112)
Creat: 0.8 mg/dL (ref 0.50–1.35)
Glucose, Bld: 59 mg/dL — ABNORMAL LOW (ref 70–99)
Potassium: 4.2 mEq/L (ref 3.5–5.3)
Sodium: 138 mEq/L (ref 135–145)
Total Bilirubin: 0.4 mg/dL (ref 0.2–1.2)
Total Protein: 7.6 g/dL (ref 6.0–8.3)

## 2014-03-06 LAB — CBC WITH DIFFERENTIAL/PLATELET
Basophils Absolute: 0.1 10*3/uL (ref 0.0–0.1)
Basophils Relative: 1 % (ref 0–1)
Eosinophils Absolute: 0.5 10*3/uL (ref 0.0–0.7)
Eosinophils Relative: 6 % — ABNORMAL HIGH (ref 0–5)
HCT: 42.2 % (ref 39.0–52.0)
Hemoglobin: 14.5 g/dL (ref 13.0–17.0)
Lymphocytes Relative: 28 % (ref 12–46)
Lymphs Abs: 2.2 10*3/uL (ref 0.7–4.0)
MCH: 32.9 pg (ref 26.0–34.0)
MCHC: 34.4 g/dL (ref 30.0–36.0)
MCV: 95.7 fL (ref 78.0–100.0)
MPV: 10.9 fL (ref 9.4–12.4)
Monocytes Absolute: 0.9 10*3/uL (ref 0.1–1.0)
Monocytes Relative: 11 % (ref 3–12)
Neutro Abs: 4.3 10*3/uL (ref 1.7–7.7)
Neutrophils Relative %: 54 % (ref 43–77)
Platelets: 160 10*3/uL (ref 150–400)
RBC: 4.41 MIL/uL (ref 4.22–5.81)
RDW: 13.5 % (ref 11.5–15.5)
WBC: 8 10*3/uL (ref 4.0–10.5)

## 2014-03-06 LAB — IRON AND TIBC
%SAT: 25 % (ref 20–55)
Iron: 73 ug/dL (ref 42–165)
TIBC: 296 ug/dL (ref 215–435)
UIBC: 223 ug/dL (ref 125–400)

## 2014-03-06 LAB — FERRITIN: Ferritin: 255 ng/mL (ref 22–322)

## 2014-03-13 ENCOUNTER — Other Ambulatory Visit: Payer: Self-pay

## 2014-03-13 DIAGNOSIS — K7460 Unspecified cirrhosis of liver: Secondary | ICD-10-CM

## 2014-03-13 NOTE — Progress Notes (Signed)
Pt's mother called and said she is aware of pts appt.

## 2014-03-13 NOTE — Progress Notes (Signed)
Pt is scheduled for [place] on 03/20/2014. LMOM instructed to be NPO after midnight.

## 2014-03-14 NOTE — Progress Notes (Signed)
cc'ed to pcp °

## 2014-03-20 ENCOUNTER — Ambulatory Visit (HOSPITAL_COMMUNITY): Admission: RE | Admit: 2014-03-20 | Payer: Medicare Other | Source: Ambulatory Visit

## 2014-03-26 ENCOUNTER — Telehealth: Payer: Self-pay | Admitting: General Practice

## 2014-03-26 NOTE — Telephone Encounter (Signed)
Tammy called in to cancel Mr. Schild's colonoscopy.  She stated he would like to have it done sometime next month and they will call back at a later time to reschdule.  I explained to her that we can't schedule a procedure past 30 days from his office visit and if it is past 30 days, he will need to be seen back in the office.  She voiced understanding and disconnected the call.  Routing to [place]

## 2014-03-28 ENCOUNTER — Ambulatory Visit (HOSPITAL_COMMUNITY): Payer: Medicare Other

## 2014-03-28 NOTE — Progress Notes (Signed)
Quick Note:  Labs ok. Unfortunately the Hep B surface Ab, Hep A Ab were not done. Let's see if we can have done at time of his pre-op labs.  Patient never had his abd u/s done, needs to reschedule. ______

## 2014-03-29 NOTE — Telephone Encounter (Signed)
Carolyn is aware.

## 2014-04-02 ENCOUNTER — Other Ambulatory Visit (HOSPITAL_COMMUNITY): Payer: Medicare Other

## 2014-04-02 ENCOUNTER — Telehealth: Payer: Self-pay | Admitting: Gastroenterology

## 2014-04-05 ENCOUNTER — Encounter (HOSPITAL_COMMUNITY): Admission: RE | Payer: Self-pay | Source: Ambulatory Visit

## 2014-04-05 ENCOUNTER — Ambulatory Visit (HOSPITAL_COMMUNITY): Admission: RE | Admit: 2014-04-05 | Payer: Medicare Other | Source: Ambulatory Visit | Admitting: Internal Medicine

## 2014-04-05 SURGERY — COLONOSCOPY WITH PROPOFOL
Anesthesia: Monitor Anesthesia Care

## 2014-04-10 ENCOUNTER — Other Ambulatory Visit (HOSPITAL_COMMUNITY): Payer: Medicare Other

## 2014-04-11 NOTE — Telephone Encounter (Signed)
Opened in error

## 2014-07-03 NOTE — H&P (Signed)
PATIENT NAME:  William Barrett, William Barrett MR#:  [id number] DATE OF BIRTH:  1960-08-04  DATE OF ADMISSION:  11/11/2011  CHIEF COMPLAINT: Lethargy.   HISTORY OF PRESENT ILLNESS: History is obtained mainly from ED records as the patient is a poor historian. 54 year old Caucasian male with notable history of alcohol dependence and chronic alcohol abuse who presents with increased lethargy that started today. Per EMS who was apparently a family member, the patient was more lethargic and there was concern that he might have taken a double dose of his blood pressure medications. Upon evaluation he was noted to be hypotensive with systolic blood pressures in the 80s so he was transported here for further evaluation.  On discussing with the patient, he says that, "My nerves were acting up so my sister's boy called the ambulance for me."  In the Emergency Department he was bolused with 2 liters. Blood pressure was unresponsive and was maintained in the 80s so he has been started on dopamine peripherally at 5 mg/h which increased his blood pressure to just over 90/60s to 70s.   REVIEW OF SYSTEMS:  Poor, based on patient's recollection. CONSTITUTIONAL: He denies fevers, chills, weight loss, or fatigue. EYES: He denies double vision, redness, or pain.  ENT:  He denies tinnitus or ear pain.  RESPIRATORY:  He admits to chronic productive cough productive of clear sputum. Denies wheeze, denies hemoptysis, denies dyspnea. CARDIOVASCULAR: He denies chest pain, dyspnea on exertion, or palpitations.  GI: He denies nausea, vomiting, diarrhea, abdominal pain, hematemesis, melena, or rectal bleeding.  GU: He denies dysuria or hematuria. Admits to frequency occasionally depending on "when I drink enough beer". ENDOCRINE: Denies increased sweating. INTEGUMENT: He denies any new skin rashes. MUSCULOSKELETAL:  He denies muscle or joint aches. NEUROLOGIC: He denies numbness or tremor. PSYCH: He admits to nervousness.   PAST MEDICAL  HISTORY:  Per records notable for: 1. Asthma. 2. Hypertension.   3. Anemia. 4. Alcohol dependence.  5. Tobacco abuse.    PAST SURGICAL HISTORY: The patient denies any surgeries.   ALLERGIES: No known drug allergies.   MEDICATIONS:  It is unclear what medications the patient is currently taking. This will need to be confirmed with the family or his primary care provider. At this time what we have available include: 1. Losartan 50 mg daily for 30 days.  2. Lisinopril 10 mg daily.  3. Ferrous sulfate 325 mg daily.  4. Albuterol inhaler, 2 puffs as needed for shortness of breath.  On inquiring of the patient, he said he takes some blue pills.  He cannot provide me with the medications that he is taking.   FAMILY HISTORY: Per records, notable for coronary artery disease. On discussion with the patient he notes that his father is deceased, "something was wrong. I ain't remember".  His mother is also deceased and he cannot remember the cause of death or any associated illnesses.   SOCIAL HISTORY: The patient drinks daily and he admits to 12 cans of beer daily for at least 8 years. He notes that when he does not drink he does have "the shakes". He denies withdrawal seizures. He is a smoker and smokes three packs per day and cannot quantify duration. He denies illicits.  Last drink was today, the date of admission.  PHYSICAL EXAMINATION:  VITAL SIGNS: On presentation systolic blood pressure was 88/59, heart rate 68, respirations 16, sating 93% on room air. Currently his blood pressure is 87/61, heart rate 69, respirations 18, sating 97% on room  air.  Of note his systolic blood pressure has ranged as high as 95 on the dopamine.   GENERAL: Caucasian male in no apparent distress. Ill kempt.   EYES: Anicteric sclerae. Normal eyelids. Pupils are equally round and reactive to light and accommodation.   ENT: No normal external ears and nares. Posterior oropharynx is clear. There is dry mucosa. No oral  lesions are seen.   NECK: Supple without lymphadenopathy. There is no thyromegaly.   RESPIRATORY: Clear to auscultation bilaterally. No wheezes, rales, or rhonchi. There is normal respiratory effort.   CARDIOVASCULAR: Normal S1 and S2. Regular rate and rhythm. No murmurs appreciated. There is no lower extremity edema.   GI: Soft, nontender, nondistended with normal bowel sounds. There is hepatomegaly on my exam extending about 4 cm below the costal margin.   SKIN:  There are areas of hyperpigmentation noted over the anterior chest wall just over the clavicular area inferior to the neck. However,  I used a ChloraPrep wipe to scrub this area and this pigment was actually removable so more concerning for dirt rather than true hyperpigmentation. There were areas of hypopigmentation intermittently over his lower extremities. No erythematous areas. Skin was warm and dry.   PSYCH: The patient is awake, alert, and oriented to self and place only. He mentioned for the year July and when I asked specifically what year it was he just looked at me and shook his head, stating, "I don't know."  Similarly for the day and date.  He has poor insight into his illness. Otherwise he has normal mood and affect currently.   LABORATORY DATA: TSH is 2.02, WBC count is 5.2, hemoglobin 11.5, platelet count is 78. Neutrophils are 46.2, eosinophils 10.4. Glucose is 87. BUN 18, creatinine 1.43 from 0.84 in April 2011. Sodium is critically low at 115, potassium is 3.1, chloride 81, bicarbonate 22, calcium is 8.4, bilirubin 0.7, alkaline phosphatase is 83, ALT is 128, AST 111, total protein 7.3, albumin is 3.8, osmolality is low at 234. His GFR is 56, anion gap is 12. Troponin is less than 0.02. Urinalysis is negative. A urine drug screen is negative. Ethanol level was 0.267%.  Magnesium is 1.4, uric acid is 6.1.   EKG shows normal sinus rhythm  at 68 beats per minute. Low voltage in limb leads with poor R wave  progression.  ASSESSMENT AND PLAN: 54 year old male with chronic alcohol abuse and hypertension, presents with hypotension, evidence of hyponatremia, hypokalemia, acute renal failure, thrombocytopenia, anemia, and encephalopathy.  1. Acute hypotension: At this time the patient has no evidence of sepsis syndrome. The history is suggestive of medication misuse possibly. At this time he has received 3 liters normal saline in the Emergency Department and he has been started on a dopamine drip. We will continue normal saline at 150 mL/hr with basic metabolic panel every eight hours given the marked hyponatremia. Plan for central venous catheter to continue low dose dopamine drip for appropriate cardiac output and perfusion.  Another etiology for this could possibly be adrenal insufficiency given his peripheral eosinophilia, which could be suggestive of adrenal insufficiency. He has received hydrocortisone in the Emergency Department so a  cosyntropin stem test will be difficult to perform currently. This can likely be performed later on during the hospitalization to rule out the possibility of adrenal insufficiency as a possible etiology for this hypotension as well.  2. Hyponatremia, severe: Review of records notes that the patient has been hyponatremic in the past. Lowest was 126 a  few years ago. This is the lowest that we have in our system. The etiology of this could be related to beer potomania, volume depletion, SIADH.  His uric acid is less suggestive of an SIADH picture. At this time we will continue with normal saline infusion with goal of no more than 12 mEq increase in 24 hours. We will check a basic metabolic panel frequently, every eight hours. We will place a nephrology consult as well. We will monitor his mental status for seizures, etc. He will be admitted to the Intensive Care Unit for close monitoring.  We have ordered urine studies- urine osmolality, urine sodium, urine urea and urine creatinine to  further assess the etiology of his hyponatremia.  Check chest X-ray. 3. Acute renal failure: This is  probably ATN secondary to hypoperfusion given his hypotension on presentation. At this time we will resuscitate with IV fluids and monitor renal function. Nephrology consult has been placed as well. He has good urine output currently and we will continue to monitor for any renal compromise.   4. Encephalopathy: Likely toxic metabolic due to severe hyponatremia, hypotension, and acute renal failure superimposed on possible underlying Wernicke-Korsakoff encephalopathy given the patient's chronic alcohol abuse. We will continue to monitor his mental status. At this time we have no evidence of seizure activity despite the severe hyponatremia. We will monitor closely.  5. Hypokalemia: This is probably from a combination of chronic alcohol abuse and magnesium wasting. This will be repleted and we will follow.  6. Hypomagnesemia: This is most likely a result of chronic alcohol abuse. This will be repleted.  7. Alcohol abuse and dependence: We will place the patient on CIWA protocol currently for concerns of alcohol withdrawal. Start folate, thiamine, multivitamin supplements. 8. Hepatitis, mild: This appears to be hepatocellular in nature, most likely secondary to chronic alcohol use. We will further assess with abdominal ultrasound. We will check an abdominal ultrasound and INR to assess the patient's coagulopathy and liver function given his severe thrombocytopenia as discussed below.  9. Thrombocytopenia: This is most likely as a result of liver disease due to chronic alcohol use. This thrombocytopenia has worsened from data as of April 2011 when his platelet count was 135.  10. Anemia: CBC states that there was some difficulty reporting his MCV due to interference of rouleau.  We will repeat the CBC in the morning. His protein difference does not suggest evidence of macroglobulins. At this time would repeat a  CBC and if rouleau formation persists, would pursue work-up for possible multiple myeloma with SPEP, etc. The patient does not have hypercalcemia at this time.   11. Tobacco abuse: Tobacco counseling was provided We will place the patient on nicotine patch.  12. Deep vein thrombosis prophylaxis: Heparin.  13. CODE STATUS: FULL CODE.   TIME SPENT ON ADMISSION:  90 minutes. Critical care time 60 minutes.    ____________________________ Samson Frederic, DO [id number] D: 11/11/2011 01:53:00 ET T: 11/11/2011 07:14:39 ET JOB#: [id number]  cc: Samson Frederic, DO, <Dictator> [facility] SIGNED 11/18/2011 2:02

## 2014-07-03 NOTE — Discharge Summary (Signed)
PATIENT NAME:  William Barrett, William Barrett MR#:  [id number] DATE OF BIRTH:  1960/09/25  DATE OF ADMISSION:  11/11/2011 DATE OF DISCHARGE:  11/13/2011  DISCHARGE DIAGNOSES:  1. Acute hypertension, unclear etiology.  2. Hyponatremia. 3. Acute renal failure.  4. Hypokalemia.  5. Hypomagnesemia.  6. Hyperglycemia.  7. Possible alcoholic liver disease. 8. Alcohol abuse. 9. Smoking. 10. Encephalopathy.   DISPOSITION: The patient is being discharged home.   FOLLOW-UP: Follow-up with PCP in 1 to 2 weeks after discharge.   DISCHARGE MEDICATIONS:  1. Albuterol inhaler 2 puffs q.i.d. as needed.  2. Ferrous sulfate 325 mg b.i.d.  3. Lopressor 25 mg b.i.d.  4. Multivitamin 1 tablet daily.  5. Folic acid 1 mg daily.  6. Thiamine 50 mg daily.   DIET: Regular.   ACTIVITY: As tolerated.   RESULTS: Ultrasound of the abdomen showed abnormal appearance of the liver. Focal area of increased echogenicity within the gallbladder without motion, possible stone versus polyp. CBD was normal. Gallbladder wall was normal. Chest x-ray showed no acute abnormality. Serum cortisol was normal. Random urea nitrogen normal.  Microbiology: Blood cultures negative so far. White count 2.9, hemoglobin 11.5, platelet 63 to 78, glucose 164, creatinine 1.42 on admission, normal by the time of discharge, sodium 115 on admission, normal by the time of discharge, potassium 3.1 on admission, normal by the time of discharge. Uric acid normal. Alcohol level was elevated. AST, ALT elevated. Urine drug screen negative.   HOSPITAL COURSE: The patient is a 54 year old male with history of hypertension, alcohol and smoking who presented with hypertension and lethargy. The etiology of his hypotension was unclear. He was afebrile with normal white count. His chest x-ray was negative for infiltrates. His urinalysis and blood cultures were negative. The patient denied taking any extra doses of antihypertensive medication. His serum cortisol level  was normal. Urine drug screen was negative. Cardiac enzymes were normal. TSH was normal. He was initially in the Intensive Care Unit on dopamine drip, which was successfully weaned off. In the last 24 hours the patient's blood pressure has been elevated; therefore, he had to be started back on Lopressor. He also had encephalopathy, which was felt to be toxic metabolic due to severe hypernatremia, hypertension and acute renal failure which resolved with conservative management. Currently, the patient is back to his normal mental status. The patient had severe hyponatremia on admission. He has had hyponatremia in the past. Also, his lowest sodium was 126 a few years ago. This was felt to be due to volume depletion and beer potomania. SIADH was found to be less likely. His urine osmolality was normal. Nephrology consultation with Dr. Candiss Norse was obtained and the patient was hydrated with IV fluids which resulted in normalization of his creatinine and sodium. He was found to have alcoholic liver disease in view of elevated AST, ALT and abnormal texture of liver on ultrasound and thrombocytopenia. He was placed on CIWA protocol, but did not go into withdrawal and did not require any medications. He has been extensively counseled about smoking cessation. He is hemodynamically stable, awake, alert, and oriented on the day of discharge.   TIME SPENT: 45 minutes.  ____________________________ Cherre Huger, MD sp:ap D: 11/13/2011 15:54:06 ET T: 11/14/2011 12:13:46 ET JOB#: [id number]  cc: Cherre Huger, MD, <Dictator> Cherre Huger MD ELECTRONICALLY SIGNED 11/21/2011 6:53

## 2015-01-20 ENCOUNTER — Emergency Department (HOSPITAL_COMMUNITY)
Admission: EM | Admit: 2015-01-20 | Discharge: 2015-01-20 | Disposition: A | Discharge status: Home / Self Care | Payer: Medicare Other | Attending: Emergency Medicine | Admitting: Emergency Medicine

## 2015-01-20 ENCOUNTER — Encounter (HOSPITAL_COMMUNITY): Payer: Self-pay | Admitting: *Deleted

## 2015-01-20 DIAGNOSIS — Z720 Tobacco use: Secondary | ICD-10-CM | POA: Insufficient documentation

## 2015-01-20 DIAGNOSIS — M7989 Other specified soft tissue disorders: Secondary | ICD-10-CM | POA: Diagnosis not present

## 2015-01-20 DIAGNOSIS — Z79899 Other long term (current) drug therapy: Secondary | ICD-10-CM | POA: Insufficient documentation

## 2015-01-20 DIAGNOSIS — F1010 Alcohol abuse, uncomplicated: Secondary | ICD-10-CM | POA: Insufficient documentation

## 2015-01-20 DIAGNOSIS — Z862 Personal history of diseases of the blood and blood-forming organs and certain disorders involving the immune mechanism: Secondary | ICD-10-CM | POA: Insufficient documentation

## 2015-01-20 DIAGNOSIS — R011 Cardiac murmur, unspecified: Secondary | ICD-10-CM | POA: Insufficient documentation

## 2015-01-20 DIAGNOSIS — I10 Essential (primary) hypertension: Secondary | ICD-10-CM | POA: Diagnosis not present

## 2015-01-20 DIAGNOSIS — R600 Localized edema: Secondary | ICD-10-CM | POA: Diagnosis not present

## 2015-01-20 DIAGNOSIS — Z8719 Personal history of other diseases of the digestive system: Secondary | ICD-10-CM | POA: Diagnosis not present

## 2015-01-20 LAB — COMPREHENSIVE METABOLIC PANEL
ALT: 27 U/L (ref 17–63)
AST: 25 U/L (ref 15–41)
Albumin: 4.1 g/dL (ref 3.5–5.0)
Alkaline Phosphatase: 100 U/L (ref 38–126)
Anion gap: 7 (ref 5–15)
BUN: 12 mg/dL (ref 6–20)
CO2: 28 mmol/L (ref 22–32)
Calcium: 9.1 mg/dL (ref 8.9–10.3)
Chloride: 105 mmol/L (ref 101–111)
Creatinine, Ser: 0.81 mg/dL (ref 0.61–1.24)
GFR calc Af Amer: >60 mL/min (ref >60)
GFR calc non Af Amer: >60 mL/min (ref >60)
Glucose, Bld: 108 mg/dL — ABNORMAL HIGH (ref 65–99)
Potassium: 3.4 mmol/L — ABNORMAL LOW (ref 3.5–5.1)
Sodium: 140 mmol/L (ref 135–145)
Total Bilirubin: 0.5 mg/dL (ref 0.3–1.2)
Total Protein: 7.4 g/dL (ref 6.5–8.1)

## 2015-01-20 MED ORDER — CHLORDIAZEPOXIDE HCL 25 MG PO CAPS: ORAL_CAPSULE | ORAL | Status: DC

## 2015-01-20 MED ORDER — AMLODIPINE BESYLATE 10 MG PO TABS: 10.0000 mg | ORAL_TABLET | Freq: Every day | ORAL | Status: DC

## 2015-01-20 MED ORDER — METOPROLOL TARTRATE 25 MG PO TABS: 25.0000 mg | ORAL_TABLET | Freq: Two times a day (BID) | ORAL | Status: DC

## 2015-01-20 NOTE — ED Notes (Signed)
Pt c/o bilateral leg swelling for the past month,

## 2015-01-20 NOTE — Discharge Instructions (Signed)
Please quit drinking alcohol. I have given you a prescription to help with withdrawal symptoms (shaky, tremor, nausea) when you decide to quit. If you were given medicines take as directed.  If you are on coumadin or contraceptives realize their levels and effectiveness is altered by many different medicines.  If you have any reaction (rash, tongues swelling, other) to the medicines stop taking and see a physician.    If your blood pressure was elevated in the ER make sure you follow up for management with a primary doctor or return for chest pain, shortness of breath or stroke symptoms.  Please follow up as directed and return to the ER or see a physician for new or worsening symptoms.  Thank you. Filed Vitals:   01/20/15 1942  BP: 154/90  Pulse: 97  Temp: 98.3 F (36.8 C)  TempSrc: Oral  Resp: 22  Height: 5\' 6"  (1.676 m)  Weight: 170 lb (77.111 kg)  SpO2: 99%

## 2015-01-20 NOTE — ED Notes (Signed)
+  2 bilateral DP. +2 pitting edema BLE. Patient states noncompliant with medications and still drinks despite liver cirrhosis diagnosis.

## 2015-01-20 NOTE — ED Provider Notes (Signed)
CSN: [id number]     Arrival date & time 01/20/15  1939 History   First MD Initiated Contact with Patient 01/20/15 1946     Chief Complaint  Patient presents with  . Leg Swelling     (Consider location/radiation/quality/duration/timing/severity/associated sxs/prior Treatment) HPI Comments: 54 year old male with chronic alcohol abuse, cirrhosis, thrombocytopenia, ulcers presents with worsening leg edema for the past month especially the past 3 or 4 days. Patient is still drinking alcohol and smoking cigarettes. Patient has no known kidney issues. Patient denies recent surgery or blood clot history. Leg edema bilateral lower extremities. No fevers or chills. No shortness of breath or chest pain. Noncompliant meds.  The history is provided by the patient and medical records.    Past Medical History  Diagnosis Date  . Hypertension   . Alcoholism ([place])   . Anemia   . Thrombocytopenia ([place])   . Undiagnosed cardiac murmurs   . Tachycardia, unspecified   . Cardiac murmur     Has been evaluated by cardiology with echocardiogram in 2014 without significant findings   Past Surgical History  Procedure Laterality Date  . Esophagogastroduodenoscopy  09/21/2008    RMR: Distal esophageal erosions consistent with erosive reflux esophagitis.Two benign-appearing prepyloric ulcers, otherwise unremarkable/small HH   Family History  Problem Relation Age of Onset  . Hypertension Mother   . Hypertension Father   . Liver disease Neg Hx   . Colon cancer Neg Hx    Social History  Substance Use Topics  . Smoking status: Current Every Day Smoker -- 3.00 packs/day for 25 years    Types: Cigarettes  . Smokeless tobacco: None  . Alcohol Use: 10.8 oz/week    18 Cans of beer per week    Review of Systems  Constitutional: Negative for fever and chills.  HENT: Negative for congestion.   Eyes: Negative for visual disturbance.  Respiratory: Negative for shortness of breath.   Cardiovascular: Positive  for leg swelling. Negative for chest pain.  Gastrointestinal: Negative for vomiting and abdominal pain.  Genitourinary: Negative for dysuria and flank pain.  Musculoskeletal: Negative for back pain, neck pain and neck stiffness.  Skin: Negative for rash.  Neurological: Negative for light-headedness and headaches.      Allergies  Review of patient's allergies indicates no known allergies.  Home Medications   Prior to Admission medications   Medication Sig Start Date End Date Taking? Authorizing Provider  amLODipine (NORVASC) 10 MG tablet Take 10 mg by mouth daily.   Yes Historical Provider, MD  metoprolol tartrate (LOPRESSOR) 25 MG tablet Take 25 mg by mouth 2 (two) times daily.   Yes Historical Provider, MD  chlordiazePOXIDE (LIBRIUM) 25 MG capsule 50mg  PO TID x 1D, then 25-50mg  PO BID X 1D, then 25-50mg  PO QD X 1D 01/20/15   Elnora Morrison, MD  polyethylene glycol-electrolytes (TRILYTE) 420 G solution Take 4,000 mLs by mouth as directed. 03/05/14   [name], MD   BP 154/90 mmHg  Pulse 97  Temp(Src) 98.3 F (36.8 C) (Oral)  Resp 22  Ht 5\' 6"  (1.676 m)  Wt 170 lb (77.111 kg)  BMI 27.45 kg/m2  SpO2 99% Physical Exam  Constitutional: He is oriented to person, place, and time. He appears well-developed and well-nourished.  HENT:  Head: Normocephalic and atraumatic.  Eyes: Right eye exhibits no discharge. Left eye exhibits no discharge.  Neck: Normal range of motion. Neck supple. No tracheal deviation present.  Cardiovascular: Normal rate and regular rhythm.   Pulmonary/Chest: Effort normal  and breath sounds normal.  Abdominal: Soft. He exhibits no distension. There is no tenderness. There is no guarding.  Musculoskeletal: He exhibits edema.  Patient has 2+ leg edema mild pitting bilateral lower extremities no calf tenderness no rashes  Neurological: He is alert and oriented to person, place, and time.  Skin: Skin is warm. No rash noted.  Psychiatric: He has a normal mood  and affect.  Nursing note and vitals reviewed.   ED Course  Procedures (including critical care time) Labs Review Labs Reviewed  COMPREHENSIVE METABOLIC PANEL - Abnormal; Notable for the following:    Potassium 3.4 (*)    Glucose, Bld 108 (*)    All other components within normal limits    Imaging Review No results found. I have personally reviewed and evaluated these images and lab results as part of my medical decision-making.   EKG Interpretation None      MDM   Final diagnoses:  Bilateral leg edema  Chronic alcohol abuse   Patient with alcohol abuse history and cirrhosis presents with worsening bilateral leg edema. No classic blood clot risk factors. I long discussion with patient and his significant other regarding importance of quitting alcohol abuse or his cirrhosis will likely end his life.  Plan to check kidney and liver function and close outpatient follow-up. Patient is no other concerns at this time.  Results and differential diagnosis were discussed with the patient/parent/guardian. Xrays were independently reviewed by myself.  Close follow up outpatient was discussed, comfortable with the plan.   Medications - No data to display  Filed Vitals:   01/20/15 1942  BP: 154/90  Pulse: 97  Temp: 98.3 F (36.8 C)  TempSrc: Oral  Resp: 22  Height: 5\' 6"  (1.676 m)  Weight: 170 lb (77.111 kg)  SpO2: 99%    Final diagnoses:  Bilateral leg edema  Chronic alcohol abuse       Elnora Morrison, MD 01/28/15 1643

## 2015-01-20 NOTE — ED Provider Notes (Signed)
Recheck at 2200:  Labs within normal limits. H&H acute distress. I refiledl for his 2 blood pressure medications at his request for one month  Nat Christen, MD 01/20/15 2200

## 2015-07-08 ENCOUNTER — Telehealth: Payer: Self-pay | Admitting: Gastroenterology

## 2015-07-08 NOTE — Telephone Encounter (Signed)
Patient is overdue follow up of liver disease. Please schedule ov.

## 2015-07-09 ENCOUNTER — Encounter: Payer: Self-pay | Admitting: Internal Medicine

## 2015-07-09 NOTE — Telephone Encounter (Signed)
APPT MADE AND LETTER SENT  °

## 2015-07-30 ENCOUNTER — Encounter: Payer: Self-pay | Admitting: Gastroenterology

## 2015-07-30 ENCOUNTER — Ambulatory Visit: Payer: Medicare Other | Admitting: Gastroenterology

## 2015-07-30 ENCOUNTER — Telehealth: Payer: Self-pay | Admitting: Gastroenterology

## 2015-07-30 NOTE — Telephone Encounter (Signed)
PT WAS A NO SHOW AND LETTER SENT  °

## 2019-04-01 ENCOUNTER — Other Ambulatory Visit: Payer: Self-pay

## 2019-04-01 ENCOUNTER — Emergency Department (HOSPITAL_COMMUNITY)
Admission: EM | Admit: 2019-04-01 | Discharge: 2019-04-02 | Disposition: A | Discharge status: Home / Self Care | Payer: Medicare Other | Attending: Emergency Medicine | Admitting: Emergency Medicine

## 2019-04-01 ENCOUNTER — Encounter (HOSPITAL_COMMUNITY): Payer: Self-pay | Admitting: Emergency Medicine

## 2019-04-01 DIAGNOSIS — F17210 Nicotine dependence, cigarettes, uncomplicated: Secondary | ICD-10-CM | POA: Diagnosis not present

## 2019-04-01 DIAGNOSIS — R2243 Localized swelling, mass and lump, lower limb, bilateral: Secondary | ICD-10-CM | POA: Insufficient documentation

## 2019-04-01 DIAGNOSIS — I10 Essential (primary) hypertension: Secondary | ICD-10-CM | POA: Diagnosis not present

## 2019-04-01 DIAGNOSIS — R600 Localized edema: Secondary | ICD-10-CM

## 2019-04-01 NOTE — ED Triage Notes (Signed)
Patient complains of swelling in bilateral feet and legs with 3 plus pitting edema that began on 2 weeks ago. Patient denies injury and history of CHF.

## 2019-04-02 LAB — CBC WITH DIFFERENTIAL/PLATELET
Abs Immature Granulocytes: 0.01 10*3/uL (ref 0.00–0.07)
Basophils Absolute: 0.1 10*3/uL (ref 0.0–0.1)
Basophils Relative: 2 %
Eosinophils Absolute: 0.6 10*3/uL — ABNORMAL HIGH (ref 0.0–0.5)
Eosinophils Relative: 10 %
HCT: 47 % (ref 39.0–52.0)
Hemoglobin: 15.4 g/dL (ref 13.0–17.0)
Immature Granulocytes: 0 %
Lymphocytes Relative: 25 %
Lymphs Abs: 1.5 10*3/uL (ref 0.7–4.0)
MCH: 35 pg — ABNORMAL HIGH (ref 26.0–34.0)
MCHC: 32.8 g/dL (ref 30.0–36.0)
MCV: 106.8 fL — ABNORMAL HIGH (ref 80.0–100.0)
Monocytes Absolute: 0.8 10*3/uL (ref 0.1–1.0)
Monocytes Relative: 12 %
Neutro Abs: 3.1 10*3/uL (ref 1.7–7.7)
Neutrophils Relative %: 51 %
Platelets: 127 10*3/uL — ABNORMAL LOW (ref 150–400)
RBC: 4.4 MIL/uL (ref 4.22–5.81)
RDW: 14.3 % (ref 11.5–15.5)
WBC: 6.1 10*3/uL (ref 4.0–10.5)
nRBC: 0 % (ref 0.0–0.2)

## 2019-04-02 LAB — BASIC METABOLIC PANEL
Anion gap: 6 (ref 5–15)
BUN: 15 mg/dL (ref 6–20)
CO2: 27 mmol/L (ref 22–32)
Calcium: 8.8 mg/dL — ABNORMAL LOW (ref 8.9–10.3)
Chloride: 102 mmol/L (ref 98–111)
Creatinine, Ser: 0.82 mg/dL (ref 0.61–1.24)
GFR calc Af Amer: >60 mL/min (ref >60)
GFR calc non Af Amer: >60 mL/min (ref >60)
Glucose, Bld: 95 mg/dL (ref 70–99)
Potassium: 3.8 mmol/L (ref 3.5–5.1)
Sodium: 135 mmol/L (ref 135–145)

## 2019-04-02 MED ORDER — FUROSEMIDE 20 MG PO TABS: 20.0000 mg | ORAL_TABLET | Freq: Every day | ORAL | 0 refills | Status: DC

## 2019-04-02 NOTE — Discharge Instructions (Addendum)
You were seen today for lower extremity swelling.  This may be related to your amlodipine.  Do not take your amlodipine for the next several days.  Instead, take Lasix 20 mg daily for the next 5 days.  You need to follow-up closely with your primary doctor for blood pressure recheck and evaluation of your edema.  If improved off of amlodipine, he may need to transition you to another blood pressure medication.  If you develop shortness of breath, chest pain, any new or worsening symptoms you should be reevaluated.

## 2019-04-02 NOTE — ED Provider Notes (Signed)
[facility] EMERGENCY DEPARTMENT Provider Note   CSN: [id number] Arrival date & time: 04/01/19  1950     History Chief Complaint  Patient presents with  . Foot Pain    William Barrett is a 59 y.o. male.  HPI     This a 59 year old male with a history of alcoholism, hypertension who presents with bilateral lower extremity edema.  Patient reports 2-week history of progressive bilateral lower extremity edema.  He noted redness.  He has had not had any fevers or calf tenderness.  Swelling is symmetric.  He denies any history of heart failure.  Denies chest pain, shortness of breath, fevers.  He reports that his girlfriend saw his legs tonight and encouraged him to be evaluated.  Denies any recent medication changes or diet changes.  He does take amlodipine and metoprolol for blood pressure.  Past Medical History:  Diagnosis Date  . Alcoholism ([place])   . Anemia   . Cardiac murmur    Has been evaluated by cardiology with echocardiogram in 2014 without significant findings  . Hypertension   . Tachycardia, unspecified   . Thrombocytopenia ([place])   . Undiagnosed cardiac murmurs     Patient Active Problem List   Diagnosis Date Noted  . Special screening for malignant neoplasms, colon 03/05/2014  . PUD (peptic ulcer disease) 03/05/2014    Class: History of  . Smoker 12/30/2012  . Shortness of breath 12/30/2012  . Chronic alcohol abuse 06/17/2012  . Hypotension 06/17/2012  . Tachycardia 06/17/2012  . Delirium tremens ([place]) 06/17/2012  . Abnormal LFTs 06/17/2012  . Hyponatremia 06/17/2012  . Hypokalemia 06/17/2012  . Dehydration 06/17/2012  . Recurrent falls 06/17/2012  . Metabolic acidosis, increased anion gap 06/17/2012  . Thrombocytopenia ([place]) 06/17/2012  . Cirrhosis ([place]) 06/17/2012  . Fracture of sternum 06/17/2012  . Diarrhea 06/17/2012    Past Surgical History:  Procedure Laterality Date  . ESOPHAGOGASTRODUODENOSCOPY  09/21/2008   RMR: Distal esophageal erosions  consistent with erosive reflux esophagitis.Two benign-appearing prepyloric ulcers, otherwise unremarkable/small HH       Family History  Problem Relation Age of Onset  . Hypertension Mother   . Hypertension Father   . Liver disease Neg Hx   . Colon cancer Neg Hx     Social History   Tobacco Use  . Smoking status: Current Every Day Smoker    Packs/day: 3.00    Years: 25.00    Pack years: 75.00    Types: Cigarettes  Substance Use Topics  . Alcohol use: Yes    Alcohol/week: 18.0 standard drinks    Types: 18 Cans of beer per week  . Drug use: No    Home Medications Prior to Admission medications   Medication Sig Start Date End Date Taking? Authorizing Provider  amLODipine (NORVASC) 10 MG tablet Take 1 tablet (10 mg total) by mouth daily. 01/20/15   Nat Christen, MD  chlordiazePOXIDE (LIBRIUM) 25 MG capsule 50mg  PO TID x 1D, then 25-50mg  PO BID X 1D, then 25-50mg  PO QD X 1D 01/20/15   Elnora Morrison, MD  furosemide (LASIX) 20 MG tablet Take 1 tablet (20 mg total) by mouth daily. 04/02/19   Donavan Kerlin, Barbette Hair, MD  metoprolol tartrate (LOPRESSOR) 25 MG tablet Take 1 tablet (25 mg total) by mouth 2 (two) times daily. 01/20/15   Nat Christen, MD  polyethylene glycol-electrolytes (TRILYTE) 420 G solution Take 4,000 mLs by mouth as directed. 03/05/14   Rourk, Cristopher Estimable, MD    Allergies  Patient has no known allergies.  Review of Systems   Review of Systems  Constitutional: Negative for fever.  Respiratory: Negative for shortness of breath.   Cardiovascular: Positive for leg swelling. Negative for chest pain.  Gastrointestinal: Negative for abdominal pain, nausea and vomiting.  Genitourinary: Negative for dysuria.  All other systems reviewed and are negative.   Physical Exam Updated Vital Signs BP (!) 143/73 (BP Location: Right Arm)   Pulse 83   Temp 98.2 F (36.8 C) (Oral)   Resp 18   Ht 1.727 m (5\' 8" )   Wt 81.6 kg   SpO2 95%   BMI 27.37 kg/m   Physical Exam Vitals  and nursing note reviewed.  Constitutional:      Appearance: He is well-developed.  HENT:     Head: Normocephalic and atraumatic.     Mouth/Throat:     Mouth: Mucous membranes are moist.  Eyes:     Pupils: Pupils are equal, round, and reactive to light.  Cardiovascular:     Rate and Rhythm: Normal rate and regular rhythm.  Pulmonary:     Effort: Pulmonary effort is normal. No respiratory distress.  Abdominal:     Palpations: Abdomen is soft.  Musculoskeletal:     Cervical back: Neck supple.     Right lower leg: Edema present.     Left lower leg: Edema present.     Comments: 3+ bilateral lower extremity edema to the knees  Lymphadenopathy:     Cervical: No cervical adenopathy.  Skin:    General: Skin is warm and dry.  Neurological:     Mental Status: He is alert and oriented to person, place, and time.  Psychiatric:        Mood and Affect: Mood normal.     ED Results / Procedures / Treatments   Labs (all labs ordered are listed, but only abnormal results are displayed) Labs Reviewed  CBC WITH DIFFERENTIAL/PLATELET - Abnormal; Notable for the following components:      Result Value   MCV 106.8 (*)    MCH 35.0 (*)    Platelets 127 (*)    Eosinophils Absolute 0.6 (*)    All other components within normal limits  BASIC METABOLIC PANEL - Abnormal; Notable for the following components:   Calcium 8.8 (*)    All other components within normal limits    EKG None  Radiology No results found.  Procedures Procedures (including critical care time)  Medications Ordered in ED Medications - No data to display  ED Course  I have reviewed the triage vital signs and the nursing notes.  Pertinent labs & imaging results that were available during my care of the patient were reviewed by me and considered in my medical decision making (see chart for details).    MDM Rules/Calculators/A&P                       Patient presents with lower extremity edema.  He is otherwise  asymptomatic.  He is not currently on any diuretic.  He does take amlodipine which can sometimes be associated with lower extremity edema.  Vital signs are largely reassuring.  Blood pressure is 143/73.  Lab work obtained and largely unremarkable without significant metabolic derangements or hypokalemia.  Discussed with patient the plan of discontinuing amlodipine and starting Lasix 20 mg daily for the next 5 days.  He will need for close follow-up with his primary physician for blood pressure recheck and evaluation of  his edema.  In the long-term he may need to switch blood pressure medications if this is thought to be related to amlodipine.  After history, exam, and medical workup I feel the patient has been appropriately medically screened and is safe for discharge home. Pertinent diagnoses were discussed with the patient. Patient was given return precautions.   Final Clinical Impression(s) / ED Diagnoses Final diagnoses:  Lower extremity edema    Rx / DC Orders ED Discharge Orders         Ordered    furosemide (LASIX) 20 MG tablet  Daily     04/02/19 0230           Merryl Hacker, MD 04/02/19 [phone]

## 2020-03-05 ENCOUNTER — Encounter: Payer: Self-pay | Admitting: Internal Medicine

## 2020-04-15 ENCOUNTER — Ambulatory Visit: Payer: Medicare Other | Admitting: Gastroenterology

## 2020-04-17 ENCOUNTER — Ambulatory Visit: Payer: Medicare Other | Admitting: Gastroenterology

## 2020-05-26 ENCOUNTER — Inpatient Hospital Stay (HOSPITAL_COMMUNITY)
Admission: EM | Admit: 2020-05-26 | Discharge: 2020-06-06 | DRG: 291 | Disposition: A | Discharge status: Home Health | Payer: Medicare HMO | Attending: Internal Medicine | Admitting: Internal Medicine

## 2020-05-26 ENCOUNTER — Encounter (HOSPITAL_COMMUNITY): Payer: Self-pay | Admitting: *Deleted

## 2020-05-26 ENCOUNTER — Other Ambulatory Visit: Payer: Self-pay

## 2020-05-26 ENCOUNTER — Emergency Department (HOSPITAL_COMMUNITY): Payer: Medicare HMO

## 2020-05-26 DIAGNOSIS — K7460 Unspecified cirrhosis of liver: Secondary | ICD-10-CM | POA: Diagnosis present

## 2020-05-26 DIAGNOSIS — J690 Pneumonitis due to inhalation of food and vomit: Secondary | ICD-10-CM | POA: Diagnosis not present

## 2020-05-26 DIAGNOSIS — F1010 Alcohol abuse, uncomplicated: Secondary | ICD-10-CM | POA: Diagnosis present

## 2020-05-26 DIAGNOSIS — I110 Hypertensive heart disease with heart failure: Principal | ICD-10-CM | POA: Diagnosis present

## 2020-05-26 DIAGNOSIS — F17210 Nicotine dependence, cigarettes, uncomplicated: Secondary | ICD-10-CM | POA: Diagnosis present

## 2020-05-26 DIAGNOSIS — R0689 Other abnormalities of breathing: Secondary | ICD-10-CM

## 2020-05-26 DIAGNOSIS — Z6839 Body mass index (BMI) 39.0-39.9, adult: Secondary | ICD-10-CM | POA: Diagnosis not present

## 2020-05-26 DIAGNOSIS — K766 Portal hypertension: Secondary | ICD-10-CM | POA: Diagnosis present

## 2020-05-26 DIAGNOSIS — K567 Ileus, unspecified: Secondary | ICD-10-CM | POA: Diagnosis not present

## 2020-05-26 DIAGNOSIS — Z9114 Patient's other noncompliance with medication regimen: Secondary | ICD-10-CM | POA: Diagnosis not present

## 2020-05-26 DIAGNOSIS — I509 Heart failure, unspecified: Secondary | ICD-10-CM

## 2020-05-26 DIAGNOSIS — R609 Edema, unspecified: Secondary | ICD-10-CM | POA: Diagnosis not present

## 2020-05-26 DIAGNOSIS — R109 Unspecified abdominal pain: Secondary | ICD-10-CM

## 2020-05-26 DIAGNOSIS — K7030 Alcoholic cirrhosis of liver without ascites: Secondary | ICD-10-CM

## 2020-05-26 DIAGNOSIS — J449 Chronic obstructive pulmonary disease, unspecified: Secondary | ICD-10-CM | POA: Diagnosis present

## 2020-05-26 DIAGNOSIS — J9601 Acute respiratory failure with hypoxia: Secondary | ICD-10-CM | POA: Diagnosis present

## 2020-05-26 DIAGNOSIS — R600 Localized edema: Secondary | ICD-10-CM

## 2020-05-26 DIAGNOSIS — J811 Chronic pulmonary edema: Principal | ICD-10-CM

## 2020-05-26 DIAGNOSIS — I5033 Acute on chronic diastolic (congestive) heart failure: Secondary | ICD-10-CM | POA: Diagnosis present

## 2020-05-26 DIAGNOSIS — R0600 Dyspnea, unspecified: Secondary | ICD-10-CM

## 2020-05-26 DIAGNOSIS — K639 Disease of intestine, unspecified: Secondary | ICD-10-CM

## 2020-05-26 DIAGNOSIS — A09 Infectious gastroenteritis and colitis, unspecified: Secondary | ICD-10-CM | POA: Diagnosis present

## 2020-05-26 DIAGNOSIS — F10131 Alcohol abuse with withdrawal delirium: Secondary | ICD-10-CM | POA: Diagnosis present

## 2020-05-26 DIAGNOSIS — I959 Hypotension, unspecified: Secondary | ICD-10-CM | POA: Diagnosis not present

## 2020-05-26 DIAGNOSIS — E669 Obesity, unspecified: Secondary | ICD-10-CM | POA: Diagnosis present

## 2020-05-26 DIAGNOSIS — Z9119 Patient's noncompliance with other medical treatment and regimen: Secondary | ICD-10-CM

## 2020-05-26 DIAGNOSIS — R0602 Shortness of breath: Secondary | ICD-10-CM

## 2020-05-26 DIAGNOSIS — I10 Essential (primary) hypertension: Secondary | ICD-10-CM | POA: Diagnosis not present

## 2020-05-26 DIAGNOSIS — E871 Hypo-osmolality and hyponatremia: Secondary | ICD-10-CM | POA: Diagnosis present

## 2020-05-26 DIAGNOSIS — Z8249 Family history of ischemic heart disease and other diseases of the circulatory system: Secondary | ICD-10-CM

## 2020-05-26 DIAGNOSIS — Z20822 Contact with and (suspected) exposure to covid-19: Secondary | ICD-10-CM | POA: Diagnosis present

## 2020-05-26 DIAGNOSIS — Z79899 Other long term (current) drug therapy: Secondary | ICD-10-CM

## 2020-05-26 LAB — RESP PANEL BY RT-PCR (FLU A&B, COVID) ARPGX2
Influenza A by PCR: NEGATIVE
Influenza B by PCR: NEGATIVE
SARS Coronavirus 2 by RT PCR: NEGATIVE

## 2020-05-26 LAB — CBC WITH DIFFERENTIAL/PLATELET
Abs Immature Granulocytes: 0.02 10*3/uL (ref 0.00–0.07)
Basophils Absolute: 0.1 10*3/uL (ref 0.0–0.1)
Basophils Relative: 1 %
Eosinophils Absolute: 0.6 10*3/uL — ABNORMAL HIGH (ref 0.0–0.5)
Eosinophils Relative: 10 %
HCT: 46 % (ref 39.0–52.0)
Hemoglobin: 15.1 g/dL (ref 13.0–17.0)
Immature Granulocytes: 0 %
Lymphocytes Relative: 18 %
Lymphs Abs: 1.1 10*3/uL (ref 0.7–4.0)
MCH: 37.4 pg — ABNORMAL HIGH (ref 26.0–34.0)
MCHC: 32.8 g/dL (ref 30.0–36.0)
MCV: 113.9 fL — ABNORMAL HIGH (ref 80.0–100.0)
Monocytes Absolute: 0.7 10*3/uL (ref 0.1–1.0)
Monocytes Relative: 11 %
Neutro Abs: 3.6 10*3/uL (ref 1.7–7.7)
Neutrophils Relative %: 60 %
Platelets: 108 10*3/uL — ABNORMAL LOW (ref 150–400)
RBC: 4.04 MIL/uL — ABNORMAL LOW (ref 4.22–5.81)
RDW: 15.4 % (ref 11.5–15.5)
WBC: 6 10*3/uL (ref 4.0–10.5)
nRBC: 0 % (ref 0.0–0.2)

## 2020-05-26 LAB — BASIC METABOLIC PANEL
Anion gap: 9 (ref 5–15)
BUN: 10 mg/dL (ref 6–20)
CO2: 31 mmol/L (ref 22–32)
Calcium: 7.9 mg/dL — ABNORMAL LOW (ref 8.9–10.3)
Chloride: 85 mmol/L — ABNORMAL LOW (ref 98–111)
Creatinine, Ser: 0.84 mg/dL (ref 0.61–1.24)
GFR, Estimated: >60 mL/min (ref >60)
Glucose, Bld: 114 mg/dL — ABNORMAL HIGH (ref 70–99)
Potassium: 4.3 mmol/L (ref 3.5–5.1)
Sodium: 125 mmol/L — ABNORMAL LOW (ref 135–145)

## 2020-05-26 LAB — COMPREHENSIVE METABOLIC PANEL
ALT: 30 U/L (ref 0–44)
AST: 28 U/L (ref 15–41)
Albumin: 3.3 g/dL — ABNORMAL LOW (ref 3.5–5.0)
Alkaline Phosphatase: 84 U/L (ref 38–126)
Anion gap: 5 (ref 5–15)
BUN: 10 mg/dL (ref 6–20)
CO2: 32 mmol/L (ref 22–32)
Calcium: 8.1 mg/dL — ABNORMAL LOW (ref 8.9–10.3)
Chloride: 86 mmol/L — ABNORMAL LOW (ref 98–111)
Creatinine, Ser: 0.78 mg/dL (ref 0.61–1.24)
GFR, Estimated: >60 mL/min (ref >60)
Glucose, Bld: 140 mg/dL — ABNORMAL HIGH (ref 70–99)
Potassium: 4.4 mmol/L (ref 3.5–5.1)
Sodium: 123 mmol/L — ABNORMAL LOW (ref 135–145)
Total Bilirubin: 0.5 mg/dL (ref 0.3–1.2)
Total Protein: 7.6 g/dL (ref 6.5–8.1)

## 2020-05-26 LAB — TROPONIN I (HIGH SENSITIVITY)
Troponin I (High Sensitivity): 17 ng/L (ref <18)
Troponin I (High Sensitivity): 17 ng/L (ref <18)

## 2020-05-26 LAB — PROTIME-INR
INR: 1.1 (ref 0.8–1.2)
Prothrombin Time: 13.9 seconds (ref 11.4–15.2)

## 2020-05-26 LAB — BRAIN NATRIURETIC PEPTIDE: B Natriuretic Peptide: 733 pg/mL — ABNORMAL HIGH (ref 0.0–100.0)

## 2020-05-26 LAB — AMMONIA: Ammonia: 49 umol/L — ABNORMAL HIGH (ref 9–35)

## 2020-05-26 MED ORDER — FOLIC ACID 1 MG PO TABS
1.0000 mg | ORAL_TABLET | Freq: Every day | ORAL | Status: DC
  Administered 2020-05-26 – 2020-06-04 (×10): 1 mg via ORAL
  Filled 2020-05-26 (×10): qty 1

## 2020-05-26 MED ORDER — FUROSEMIDE 10 MG/ML IJ SOLN
40.0000 mg | Freq: Once | INTRAMUSCULAR | Status: AC
  Administered 2020-05-26: 40 mg via INTRAVENOUS

## 2020-05-26 MED ORDER — ADULT MULTIVITAMIN W/MINERALS CH
1.0000 | ORAL_TABLET | Freq: Every day | ORAL | Status: DC
  Administered 2020-05-26 – 2020-06-06 (×12): 1 via ORAL
  Filled 2020-05-26 (×13): qty 1

## 2020-05-26 MED ORDER — LORAZEPAM 2 MG/ML IJ SOLN
1.0000 mg | INTRAMUSCULAR | Status: AC | PRN
  Administered 2020-05-27: 2 mg via INTRAVENOUS
  Filled 2020-05-26: qty 1

## 2020-05-26 MED ORDER — LORAZEPAM 1 MG PO TABS
1.0000 mg | ORAL_TABLET | ORAL | Status: AC | PRN
  Administered 2020-05-27: 1 mg via ORAL
  Filled 2020-05-26: qty 1

## 2020-05-26 MED ORDER — POTASSIUM CHLORIDE CRYS ER 20 MEQ PO TBCR
40.0000 meq | EXTENDED_RELEASE_TABLET | Freq: Two times a day (BID) | ORAL | Status: DC
  Administered 2020-05-26 – 2020-05-29 (×7): 40 meq via ORAL
  Filled 2020-05-26 (×3): qty 2
  Filled 2020-05-26: qty 4
  Filled 2020-05-26 (×4): qty 2

## 2020-05-26 MED ORDER — ENOXAPARIN SODIUM 40 MG/0.4ML ~~LOC~~ SOLN
40.0000 mg | SUBCUTANEOUS | Status: DC
  Administered 2020-05-26 – 2020-05-30 (×5): 40 mg via SUBCUTANEOUS
  Filled 2020-05-26 (×5): qty 0.4

## 2020-05-26 MED ORDER — METOPROLOL TARTRATE 25 MG PO TABS
25.0000 mg | ORAL_TABLET | Freq: Two times a day (BID) | ORAL | Status: DC
  Administered 2020-05-26 – 2020-05-31 (×9): 25 mg via ORAL
  Filled 2020-05-26 (×12): qty 1

## 2020-05-26 MED ORDER — THIAMINE HCL 100 MG/ML IJ SOLN
100.0000 mg | Freq: Every day | INTRAMUSCULAR | Status: DC
  Filled 2020-05-26 (×2): qty 2

## 2020-05-26 MED ORDER — FUROSEMIDE 10 MG/ML IJ SOLN
40.0000 mg | Freq: Two times a day (BID) | INTRAMUSCULAR | Status: DC
  Administered 2020-05-27 – 2020-05-28 (×3): 40 mg via INTRAVENOUS
  Filled 2020-05-26 (×3): qty 4

## 2020-05-26 MED ORDER — FUROSEMIDE 10 MG/ML IJ SOLN
40.0000 mg | Freq: Once | INTRAMUSCULAR | Status: DC
  Filled 2020-05-26: qty 4

## 2020-05-26 MED ORDER — FUROSEMIDE 40 MG PO TABS
40.0000 mg | ORAL_TABLET | Freq: Once | ORAL | Status: DC
  Filled 2020-05-26: qty 1

## 2020-05-26 MED ORDER — ONDANSETRON HCL 4 MG/2ML IJ SOLN: 4.0000 mg | Freq: Four times a day (QID) | INTRAMUSCULAR | Status: DC | PRN

## 2020-05-26 MED ORDER — ONDANSETRON HCL 4 MG PO TABS: 4.0000 mg | ORAL_TABLET | Freq: Four times a day (QID) | ORAL | Status: DC | PRN

## 2020-05-26 MED ORDER — THIAMINE HCL 100 MG PO TABS
100.0000 mg | ORAL_TABLET | Freq: Every day | ORAL | Status: DC
  Administered 2020-05-26 – 2020-06-06 (×12): 100 mg via ORAL
  Filled 2020-05-26 (×12): qty 1

## 2020-05-26 NOTE — ED Notes (Signed)
[name] (girlfriend) [phone] Helene Kelp (sister)  [phone]

## 2020-05-26 NOTE — ED Triage Notes (Signed)
Pt brought in by [facility] from home with c/o SOB x 3 days. Pt hasn't taken his Lasix in 3 weeks. Pt has had Albuterol, Duoneb and 1 Nitro tab by EMS. EMS reports pt has rales, rhonchi on auscultation. Pt's O2 sat at 87% on RA when EMS arrived. O2 sat increased to 96% on CPAP. CBG 196. 20g IV in rt a/c by EMS.

## 2020-05-26 NOTE — ED Provider Notes (Signed)
[facility] Provider Note  CSN: [id number] Arrival date & time: 05/26/20 1528    History Chief Complaint  Patient presents with  . Shortness of Breath    HPI  William Barrett is a 60 y.o. male with history of alcohol abuse presents via EMS for evaluation of leg/abdomen swelling and SOB. He initially denied any liver problems but then admitted to history of cirrhosis. He was previously prescribed fluid pills but he has not taken them in a while. No reported history of CHF or kidney disease. He was noted to be hypoxic with EMS, improved with CPAP enroute. He was given albuterol, atrovent and nitro on the way.    Past Medical History:  Diagnosis Date  . Alcoholism ([place])   . Anemia   . Cardiac murmur    Has been evaluated by cardiology with echocardiogram in 2014 without significant findings  . Hypertension   . Tachycardia, unspecified   . Thrombocytopenia ([place])   . Undiagnosed cardiac murmurs     Past Surgical History:  Procedure Laterality Date  . ESOPHAGOGASTRODUODENOSCOPY  09/21/2008   RMR: Distal esophageal erosions consistent with erosive reflux esophagitis.Two benign-appearing prepyloric ulcers, otherwise unremarkable/small HH    Family History  Problem Relation Age of Onset  . Hypertension Mother   . Hypertension Father   . Liver disease Neg Hx   . Colon cancer Neg Hx     Social History   Tobacco Use  . Smoking status: Current Every Day Smoker    Packs/day: 3.00    Years: 25.00    Pack years: 75.00    Types: Cigarettes, Cigars  . Smokeless tobacco: Never Used  Substance Use Topics  . Alcohol use: Yes    Comment: 2 40oz beers daily   . Drug use: No     Home Medications Prior to Admission medications   Medication Sig Start Date End Date Taking? Authorizing Provider  furosemide (LASIX) 20 MG tablet Take 1 tablet (20 mg total) by mouth daily. 04/02/19  Yes Horton, Barbette Hair, MD  metoprolol tartrate (LOPRESSOR) 25 MG tablet Take  1 tablet (25 mg total) by mouth 2 (two) times daily. 01/20/15  Yes Nat Christen, MD     Allergies    Patient has no known allergies.   Review of Systems   Review of Systems A comprehensive review of systems was completed and negative except as noted in HPI.    Physical Exam BP 118/66 (BP Location: Left Arm)   Pulse 80   Temp 98.6 F (37 C) (Oral)   Resp 18   Ht 5\' 8"  (1.727 m)   Wt 117.3 kg   SpO2 98%   BMI 39.32 kg/m   Physical Exam Vitals and nursing note reviewed.  Constitutional:      Appearance: Normal appearance.  HENT:     Head: Normocephalic and atraumatic.     Nose: Nose normal.     Mouth/Throat:     Mouth: Mucous membranes are moist.  Eyes:     Extraocular Movements: Extraocular movements intact.     Conjunctiva/sclera: Conjunctivae normal.  Cardiovascular:     Rate and Rhythm: Normal rate.  Pulmonary:     Effort: Pulmonary effort is normal.     Breath sounds: Rales present.  Abdominal:     General: Abdomen is flat. There is distension.     Palpations: Abdomen is soft. There is no mass.     Tenderness: There is no abdominal tenderness.  Musculoskeletal:  General: No swelling. Normal range of motion.     Cervical back: Neck supple.     Right lower leg: Edema present.     Left lower leg: Edema present.  Skin:    General: Skin is warm and dry.  Neurological:     General: No focal deficit present.     Mental Status: He is alert.  Psychiatric:        Mood and Affect: Mood normal.      ED Results / Procedures / Treatments   Labs (all labs ordered are listed, but only abnormal results are displayed) Labs Reviewed  COMPREHENSIVE METABOLIC PANEL - Abnormal; Notable for the following components:      Result Value   Sodium 123 (*)    Chloride 86 (*)    Glucose, Bld 140 (*)    Calcium 8.1 (*)    Albumin 3.3 (*)    All other components within normal limits  BRAIN NATRIURETIC PEPTIDE - Abnormal; Notable for the following components:   B  Natriuretic Peptide 733.0 (*)    All other components within normal limits  CBC WITH DIFFERENTIAL/PLATELET - Abnormal; Notable for the following components:   RBC 4.04 (*)    MCV 113.9 (*)    MCH 37.4 (*)    Platelets 108 (*)    Eosinophils Absolute 0.6 (*)    All other components within normal limits  AMMONIA - Abnormal; Notable for the following components:   Ammonia 49 (*)    All other components within normal limits  BASIC METABOLIC PANEL - Abnormal; Notable for the following components:   Sodium 125 (*)    Chloride 85 (*)    Glucose, Bld 114 (*)    Calcium 7.9 (*)    All other components within normal limits  RESP PANEL BY RT-PCR (FLU A&B, COVID) ARPGX2  PROTIME-INR  HIV ANTIBODY (ROUTINE TESTING W REFLEX)  BASIC METABOLIC PANEL  TROPONIN I (HIGH SENSITIVITY)  TROPONIN I (HIGH SENSITIVITY)    EKG None  Radiology DG Chest Port 1 View  Result Date: 05/26/2020 CLINICAL DATA:  Short of breath.  Lower extremity edema EXAM: PORTABLE CHEST 1 VIEW COMPARISON:  November 11, 2011 FINDINGS: There is cardiomegaly with mild pulmonary venous hypertension. There is no appreciable edema or airspace opacity. No adenopathy. No bone lesions. IMPRESSION: Cardiomegaly with a degree of pulmonary vascular congestion. No edema or airspace opacity. Electronically Signed   By: Lowella Grip III M.D.   On: 05/26/2020 16:14    Procedures Procedures  Medications Ordered in the ED Medications  metoprolol tartrate (LOPRESSOR) tablet 25 mg (25 mg Oral Given 05/26/20 2133)  LORazepam (ATIVAN) tablet 1-4 mg (has no administration in time range)    Or  LORazepam (ATIVAN) injection 1-4 mg (has no administration in time range)  thiamine tablet 100 mg (100 mg Oral Given 05/26/20 2019)    Or  thiamine (B-1) injection 100 mg ( Intravenous See Alternative 2/72/53 6644)  folic acid (FOLVITE) tablet 1 mg (1 mg Oral Given 05/26/20 2019)  multivitamin with minerals tablet 1 tablet (1 tablet Oral Given 05/26/20  2019)  furosemide (LASIX) injection 40 mg (has no administration in time range)  potassium chloride SA (KLOR-CON) CR tablet 40 mEq (40 mEq Oral Given 05/26/20 2133)  enoxaparin (LOVENOX) injection 40 mg (40 mg Subcutaneous Given 05/26/20 2020)  ondansetron (ZOFRAN) tablet 4 mg (has no administration in time range)    Or  ondansetron (ZOFRAN) injection 4 mg (has no administration in time range)  furosemide (LASIX) injection 40 mg (40 mg Intravenous Given 05/26/20 1652)     MDM Rules/Calculators/A&P MDM Patient with symptoms of fluid overload, likely from liver disease. Will check labs, including CBC, CMP, INR and ammonia as well as Trop, BNP, EKG and CXR for other causes. Patient not hypoxic on NRB, will work to titrate down.   ED Course  I have reviewed the triage vital signs and the nursing notes.  Pertinent labs & imaging results that were available during my care of the patient were reviewed by me and considered in my medical decision making (see chart for details).  Clinical Course as of 05/26/20 2222  Sun May 26, 2020  1605 INR is normal.  [CS]  8032 CBC is unremarkable except for mild thrombocytopenia [CS]  1622 Ammonia and BNP mildly elevated. No old to compare. [CS]  1224 CXR with cardiomegaly and interstitial edema. Will add IV lasix.  [CS]  8250 CMP shows moderately decreased albumin, but otherwise unremarkable. Trop is neg.  [CS]  0370 Covid is neg. Will discuss admission with hospitalist for diuresis.  [CS]  4888 Spoke with Dr. Nehemiah Settle, Hospitalist, who will evaluate for admission.  [CS]    Clinical Course User Index [CS] Truddie Hidden, MD    Final Clinical Impression(s) / ED Diagnoses Final diagnoses:  Chronic pulmonary edema  Peripheral edema  Alcoholic cirrhosis, unspecified whether ascites present [facility])    Rx / DC Orders ED Discharge Orders    None       Truddie Hidden, MD 05/26/20 2222

## 2020-05-26 NOTE — H&P (Signed)
History and Physical  AVERILL PONS LKG:[id number] DOB: 1960/08/11 DOA: 05/26/2020  Referring physician: Dr Karle Starch, ED physician PCP: [name], MD  Outpatient Specialists:   Patient Coming From: home  Chief Complaint: SOB, leg swelling  HPI: LEONELL LOBDELL is a 60 y.o. male with a history of hypertension, chronic alcohol use, alcoholic cirrhosis.  Patient was at a prescription for Lasix a few weeks ago due to lower extremity swelling.  He uses for 2 to 3 weeks, then stopped because he ran out of his medications.  Since then, he has had increasing shortness of breath, increasing edema in his lower extremities.  No palliating or provoking factors, although lower extremity edema did improve with the Lasix.  The patient became so short of breath today that he called EMS.  He was noted to be mildly hypoxic.  They started him on CPAP in route to the hospital.  By the time they reached the hospital, his breathing had improved that they switched him to a nonrebreather.  He is a chronic alcohol drinker and reports drinking a couple of 40s a day.  He reports not missing any days.  Denies fevers, chills, nausea, vomiting.  Emergency Department Course: Chest x-ray shows mild interstitial edema.  BNP at 733.  High-sensitivity troponin XVII.  White count 6.  Review of Systems:   Pt denies any fevers, chills, nausea, vomiting, diarrhea, constipation, abdominal pain, palpitations, headache, vision changes, lightheadedness, dizziness, melena, rectal bleeding.  Review of systems are otherwise negative  Past Medical History:  Diagnosis Date  . Alcoholism ([place])   . Anemia   . Cardiac murmur    Has been evaluated by cardiology with echocardiogram in 2014 without significant findings  . Hypertension   . Tachycardia, unspecified   . Thrombocytopenia ([place])   . Undiagnosed cardiac murmurs    Past Surgical History:  Procedure Laterality Date  . ESOPHAGOGASTRODUODENOSCOPY  09/21/2008   RMR:  Distal esophageal erosions consistent with erosive reflux esophagitis.Two benign-appearing prepyloric ulcers, otherwise unremarkable/small HH   Social History:  reports that he has been smoking cigarettes and cigars. He has a 75.00 pack-year smoking history. He has never used smokeless tobacco. He reports current alcohol use. He reports that he does not use drugs. Patient lives at home  No Known Allergies  Family History  Problem Relation Age of Onset  . Hypertension Mother   . Hypertension Father   . Liver disease Neg Hx   . Colon cancer Neg Hx       Prior to Admission medications   Medication Sig Start Date End Date Taking? Authorizing Provider  furosemide (LASIX) 20 MG tablet Take 1 tablet (20 mg total) by mouth daily. 04/02/19  Yes Horton, Barbette Hair, MD  metoprolol tartrate (LOPRESSOR) 25 MG tablet Take 1 tablet (25 mg total) by mouth 2 (two) times daily. 01/20/15  Yes Nat Christen, MD    Physical Exam: BP 127/72   Pulse 81   Temp 98.4 F (36.9 C) (Oral)   Resp 19   Ht 5\' 8"  (1.727 m)   Wt 108.9 kg   SpO2 97%   BMI 36.49 kg/m   . General: Middle-age male. Awake and alert and oriented x3. No acute cardiopulmonary distress.  Marland Kitchen HEENT: Normocephalic atraumatic.  Right and left ears normal in appearance.  Pupils equal, round, reactive to light. Extraocular muscles are intact. Sclerae anicteric and noninjected.  Moist mucosal membranes. No mucosal lesions.  . Neck: Neck supple without lymphadenopathy. No carotid bruits. No masses  palpated.  . Cardiovascular: Regular rate with normal S1-S2 sounds. No murmurs, rubs, gallops auscultated. No JVD.  Marland Kitchen Respiratory: Rales bilaterally in bases.  No accessory muscle use. . Abdomen: Soft, nontender.  Abdomen obese and unable to to feel liver margin.. Active bowel sounds. No masses or hepatosplenomegaly  . Skin: No rashes, lesions, or ulcerations.  Dry, warm to touch. 2+ dorsalis pedis and radial pulses. . Musculoskeletal: No calf or leg  pain. All major joints not erythematous nontender.  No upper or lower joint deformation.  Good ROM.  No contractures  . Psychiatric: Intact judgment and insight. Pleasant and cooperative. . Neurologic: No focal neurological deficits. Strength is 5/5 and symmetric in upper and lower extremities.  Cranial nerves II through XII are grossly intact.           Labs on Admission: I have personally reviewed following labs and imaging studies  CBC: Recent Labs  Lab 05/26/20 1544  WBC 6.0  NEUTROABS 3.6  HGB 15.1  HCT 46.0  MCV 113.9*  PLT 283*   Basic Metabolic Panel: Recent Labs  Lab 05/26/20 1544  NA 123*  K 4.4  CL 86*  CO2 32  GLUCOSE 140*  BUN 10  CREATININE 0.78  CALCIUM 8.1*   GFR: Estimated Creatinine Clearance: 119 mL/min (by C-G formula based on SCr of 0.78 mg/dL). Liver Function Tests: Recent Labs  Lab 05/26/20 1544  AST 28  ALT 30  ALKPHOS 84  BILITOT 0.5  PROT 7.6  ALBUMIN 3.3*   No results for input(s): LIPASE, AMYLASE in the last 168 hours. Recent Labs  Lab 05/26/20 1544  AMMONIA 49*   Coagulation Profile: Recent Labs  Lab 05/26/20 1544  INR 1.1   Cardiac Enzymes: No results for input(s): CKTOTAL, CKMB, CKMBINDEX, TROPONINI in the last 168 hours. BNP (last 3 results) No results for input(s): PROBNP in the last 8760 hours. HbA1C: No results for input(s): HGBA1C in the last 72 hours. CBG: No results for input(s): GLUCAP in the last 168 hours. Lipid Profile: No results for input(s): CHOL, HDL, LDLCALC, TRIG, CHOLHDL, LDLDIRECT in the last 72 hours. Thyroid Function Tests: No results for input(s): TSH, T4TOTAL, FREET4, T3FREE, THYROIDAB in the last 72 hours. Anemia Panel: No results for input(s): VITAMINB12, FOLATE, FERRITIN, TIBC, IRON, RETICCTPCT in the last 72 hours. Urine analysis:    Component Value Date/Time   COLORURINE YELLOW 06/17/2012 2100   APPEARANCEUR CLEAR 06/17/2012 2100   APPEARANCEUR Clear 11/10/2011 2154   LABSPEC  <1.005 (L) 06/17/2012 2100   LABSPEC 1.003 11/10/2011 2154   PHURINE 6.5 06/17/2012 2100   GLUCOSEU NEGATIVE 06/17/2012 2100   GLUCOSEU Negative 11/10/2011 2154   HGBUR TRACE (A) 06/17/2012 2100   BILIRUBINUR NEGATIVE 06/17/2012 2100   BILIRUBINUR Negative 11/10/2011 2154   KETONESUR 15 (A) 06/17/2012 2100   PROTEINUR NEGATIVE 06/17/2012 2100   UROBILINOGEN 0.2 06/17/2012 2100   NITRITE NEGATIVE 06/17/2012 2100   LEUKOCYTESUR NEGATIVE 06/17/2012 2100   LEUKOCYTESUR Negative 11/10/2011 2154   Sepsis Labs: @LABRCNTIP (procalcitonin:4,lacticidven:4) ) Recent Results (from the past 240 hour(s))  Resp Panel by RT-PCR (Flu A&B, Covid) Nasopharyngeal Swab     Status: None   Collection Time: 05/26/20  4:13 PM   Specimen: Nasopharyngeal Swab; Nasopharyngeal(NP) swabs in vial transport medium  Result Value Ref Range Status   SARS Coronavirus 2 by RT PCR NEGATIVE NEGATIVE Final    Comment: (NOTE) SARS-CoV-2 target nucleic acids are NOT DETECTED.  The SARS-CoV-2 RNA is generally detectable in upper respiratory specimens during  the acute phase of infection. The lowest concentration of SARS-CoV-2 viral copies this assay can detect is 138 copies/mL. A negative result does not preclude SARS-Cov-2 infection and should not be used as the sole basis for treatment or other patient management decisions. A negative result may occur with  improper specimen collection/handling, submission of specimen other than nasopharyngeal swab, presence of viral mutation(s) within the areas targeted by this assay, and inadequate number of viral copies(<138 copies/mL). A negative result must be combined with clinical observations, patient history, and epidemiological information. The expected result is Negative.  Fact Sheet for Patients:  EntrepreneurPulse.com.au  Fact Sheet for Healthcare Providers:  IncredibleEmployment.be  This test is no t yet approved or cleared by the  [place] FDA and  has been authorized for detection and/or diagnosis of SARS-CoV-2 by FDA under an Emergency Use Authorization (EUA). This EUA will remain  in effect (meaning this test can be used) for the duration of the COVID-19 declaration under Section 564(b)(1) of the Act, 21 U.S.C.section 360bbb-3(b)(1), unless the authorization is terminated  or revoked sooner.       Influenza A by PCR NEGATIVE NEGATIVE Final   Influenza B by PCR NEGATIVE NEGATIVE Final    Comment: (NOTE) The Xpert Xpress SARS-CoV-2/FLU/RSV plus assay is intended as an aid in the diagnosis of influenza from Nasopharyngeal swab specimens and should not be used as a sole basis for treatment. Nasal washings and aspirates are unacceptable for Xpert Xpress SARS-CoV-2/FLU/RSV testing.  Fact Sheet for Patients: EntrepreneurPulse.com.au  Fact Sheet for Healthcare Providers: IncredibleEmployment.be  This test is not yet approved or cleared by the [place] FDA and has been authorized for detection and/or diagnosis of SARS-CoV-2 by FDA under an Emergency Use Authorization (EUA). This EUA will remain in effect (meaning this test can be used) for the duration of the COVID-19 declaration under Section 564(b)(1) of the Act, 21 U.S.C. section 360bbb-3(b)(1), unless the authorization is terminated or revoked.  Performed at [facility], [address], [place], [place] [address]      Radiological Exams on Admission: DG Chest [facility] 1 View  Result Date: 05/26/2020 CLINICAL DATA:  Short of breath.  Lower extremity edema EXAM: PORTABLE CHEST 1 VIEW COMPARISON:  November 11, 2011 FINDINGS: There is cardiomegaly with mild pulmonary venous hypertension. There is no appreciable edema or airspace opacity. No adenopathy. No bone lesions. IMPRESSION: Cardiomegaly with a degree of pulmonary vascular congestion. No edema or airspace opacity. Electronically Signed   By: Lowella Grip III  M.D.   On: 05/26/2020 16:14    EKG: Independently reviewed. EKG pending  Assessment/Plan: Principal Problem:   Acute respiratory failure with hypoxia (HCC) Active Problems:   Chronic alcohol abuse   Hyponatremia   Cirrhosis (HCC)   Acute CHF (congestive heart failure) ([place])   Essential hypertension    This patient was discussed with the ED physician, including pertinent vitals, physical exam findings, labs, and imaging.  We also discussed care given by the ED provider.  1. Acute respiratory failure with hypoxia secondary to acute congestive heart failure Telemetry monitoring Strict I/O Daily Weights Diuresis: Lasix 40 mg twice daily Potassium: 40 mEq twice a day by mouth Echo cardiac exam tomorrow Repeat BMP tomorrow 2. Alcoholic cirrhosis 3. Hyponatremia a. Beer portomania versus hyponatremia secondary to chest and heart failure b. We will diurese and watch improvement c. Fluid restrict 4. Chronic alcohol use a. CIWA protocol 5. Hypertension a. Continue metoprolol  DVT prophylaxis: Lovenox Consultants: None Code Status: Full  code Family Communication: None Disposition Plan: Patient should be able to return home following admission   Truett Mainland, DO

## 2020-05-27 ENCOUNTER — Inpatient Hospital Stay (HOSPITAL_COMMUNITY): Payer: Medicare HMO

## 2020-05-27 DIAGNOSIS — I5033 Acute on chronic diastolic (congestive) heart failure: Secondary | ICD-10-CM

## 2020-05-27 LAB — BASIC METABOLIC PANEL
Anion gap: 9 (ref 5–15)
BUN: 10 mg/dL (ref 6–20)
CO2: 34 mmol/L — ABNORMAL HIGH (ref 22–32)
Calcium: 8.2 mg/dL — ABNORMAL LOW (ref 8.9–10.3)
Chloride: 85 mmol/L — ABNORMAL LOW (ref 98–111)
Creatinine, Ser: 0.72 mg/dL (ref 0.61–1.24)
GFR, Estimated: >60 mL/min (ref >60)
Glucose, Bld: 117 mg/dL — ABNORMAL HIGH (ref 70–99)
Potassium: 4.3 mmol/L (ref 3.5–5.1)
Sodium: 128 mmol/L — ABNORMAL LOW (ref 135–145)

## 2020-05-27 LAB — ECHOCARDIOGRAM COMPLETE
AR max vel: 2.97 cm2
AV Area VTI: 3.11 cm2
AV Area mean vel: 2.92 cm2
AV Mean grad: 11.7 mmHg
AV Peak grad: 16.8 mmHg
Ao pk vel: 2.05 m/s
Area-P 1/2: 2.99 cm2
Height: 68 in
MV M vel: 3.94 m/s
MV Peak grad: 62.1 mmHg
S' Lateral: 2.22 cm
Weight: 4028.25 oz

## 2020-05-27 LAB — HIV ANTIBODY (ROUTINE TESTING W REFLEX): HIV Screen 4th Generation wRfx: NONREACTIVE

## 2020-05-27 MED ORDER — DIAZEPAM 2 MG PO TABS
2.0000 mg | ORAL_TABLET | Freq: Three times a day (TID) | ORAL | Status: DC
  Administered 2020-05-27 (×2): 2 mg via ORAL
  Filled 2020-05-27 (×4): qty 1

## 2020-05-27 MED ORDER — LACTULOSE 10 GM/15ML PO SOLN: 30.0000 g | Freq: Two times a day (BID) | ORAL | Status: DC

## 2020-05-27 MED ORDER — LACTULOSE 10 GM/15ML PO SOLN
30.0000 g | Freq: Every day | ORAL | Status: DC
  Administered 2020-05-27 – 2020-05-28 (×2): 30 g via ORAL
  Filled 2020-05-27 (×2): qty 60

## 2020-05-27 NOTE — Progress Notes (Signed)
*  PRELIMINARY RESULTS* Echocardiogram 2D Echocardiogram has been performed.  Leavy Cella 05/27/2020, 2:47 PM

## 2020-05-27 NOTE — Progress Notes (Signed)
Patient has been loopy since beginning of shift. When you talk to him he will wake up but then he just doses back off. Per day shift nurse he has slept the majority of the day. Patient BP was 97/65 so Lopressor was not given. MD made aware of patient LOC so his scheduled nighttime Valium was held.

## 2020-05-27 NOTE — Progress Notes (Signed)
Patient Demographics:    William Barrett, is a 60 y.o. male, DOB - Jul 09, 1960, WGN:[id number]  Admit date - 05/26/2020   Admitting Physician Truett Mainland, DO  Outpatient Primary MD for the patient is William Berry, MD  LOS - 1   Chief Complaint  Patient presents with  . Shortness of Breath        Subjective:    William Barrett today has no fevers, no emesis,  No chest pain,  -pt with tremors   Assessment  & Plan :    Principal Problem:   Acute respiratory failure with hypoxia (HCC) Active Problems:   Chronic alcohol abuse   Hyponatremia   Cirrhosis (HCC)   Acute CHF (congestive heart failure) ([place])   Essential hypertension  Brief Summary:- 60 y.o. male with a history of hypertension, chronic alcohol use, alcoholic cirrhosis, heart failure with preserved EF  admitted on 05/26/2020 with worsening shortness of breath and acute hypoxic respiratory failure after running out of diuretics.  A/p 1)HFpEF--acute on chronic diastolic dysfunction CHF--- patient is noncompliant with diuretic -Patient has high salt intake and drinks lots of beer -IV Lasix as ordered -Daily weight and fluid input and output monitoring -Chest x-ray and BNP consistent with CHF exacerbation -Echo pending  2) alcohol withdrawal--- patient with tremors, some confusional concerns -Benzos as ordered -Folic acid and thiamine as ordered  3) alcoholic liver disease--- low platelets and high MCV noted -Ammonia is 49, INR and LFTs are not elevated -Lactulose as ordered  4) acute hypoxic respiratory failure----100 to #1 above should improve with diuresis  5) hyponatremia--- multifactorial, partly due to beer potomania compounded by hemodilution from CHF and liver cirrhosis -Sodium is up to 128 from 123 continue gentle diuresis  Disposition/Need for in-Hospital Stay- patient unable to be discharged at this time due to  --acute CHF exacerbation requiring IV diuresis, acute hypoxic respiratory failure requiring supplemental oxygen*  Status is: Inpatient  Remains inpatient appropriate because:Please see above   Disposition: The patient is from: Home              Anticipated d/c is to: Home              Anticipated d/c date is: 2 days              Patient currently is not medically stable to d/c. Barriers: Not Clinically Stable-   Code Status :  -  Code Status: Full Code   Family Communication:    NA (patient is alert, awake and coherent)   Consults  :    DVT Prophylaxis  :   - SCDs  enoxaparin (LOVENOX) injection 40 mg Start: 05/26/20 2015    Lab Results  Component Value Date   PLT 108 (L) 05/26/2020    Inpatient Medications  Scheduled Meds: . diazepam  2 mg Oral TID  . enoxaparin (LOVENOX) injection  40 mg Subcutaneous Q24H  . folic acid  1 mg Oral Daily  . furosemide  40 mg Intravenous Q12H  . lactulose  30 g Oral Daily  . metoprolol tartrate  25 mg Oral BID  . multivitamin with minerals  1 tablet Oral Daily  . potassium chloride  40 mEq Oral BID  . thiamine  100 mg  Oral Daily   Or  . thiamine  100 mg Intravenous Daily   Continuous Infusions: PRN Meds:.LORazepam **OR** LORazepam, ondansetron **OR** ondansetron (ZOFRAN) IV    Anti-infectives (From admission, onward)   None        Objective:   Vitals:   05/27/20 0233 05/27/20 0603 05/27/20 1200 05/27/20 1322  BP: 112/72 127/86 129/88 126/87  Pulse: 81 84 87 84  Resp: 17 18 19 18   Temp: 98.4 F (36.9 C) 97.8 F (36.6 C) 98.1 F (36.7 C) 98.5 F (36.9 C)  TempSrc: Oral Oral Oral   SpO2: 90% 92% 93% 99%  Weight:  114.2 kg    Height:        Wt Readings from Last 3 Encounters:  05/27/20 114.2 kg  04/01/19 81.6 kg  01/20/15 77.1 kg     Intake/Output Summary (Last 24 hours) at 05/27/2020 1714 Last data filed at 05/27/2020 0900 Gross per 24 hour  Intake 480 ml  Output 4350 ml  Net -3870 ml     Physical  Exam  Gen:- Awake Alert, dyspnea on exertion noted HEENT:- [place].AT, No sclera icterus Nose- [place] 2L/min Neck-Supple Neck,No JVD,.  Lungs-diminished breath sounds, bibasilar rales  CV- S1, S2 normal, regular  Abd-  +ve B.Sounds, Abd Soft, No tenderness,    Extremity/Skin:- +  edema, pedal pulses present  Psych-affect is appropriate, oriented x3 Neuro-no new focal deficits, + tremors   Data Review:   Micro Results Recent Results (from the past 240 hour(s))  Resp Panel by RT-PCR (Flu A&B, Covid) Nasopharyngeal Swab     Status: None   Collection Time: 05/26/20  4:13 PM   Specimen: Nasopharyngeal Swab; Nasopharyngeal(NP) swabs in vial transport medium  Result Value Ref Range Status   SARS Coronavirus 2 by RT PCR NEGATIVE NEGATIVE Final    Comment: (NOTE) SARS-CoV-2 target nucleic acids are NOT DETECTED.  The SARS-CoV-2 RNA is generally detectable in upper respiratory specimens during the acute phase of infection. The lowest concentration of SARS-CoV-2 viral copies this assay can detect is 138 copies/mL. A negative result does not preclude SARS-Cov-2 infection and should not be used as the sole basis for treatment or other patient management decisions. A negative result may occur with  improper specimen collection/handling, submission of specimen other than nasopharyngeal swab, presence of viral mutation(s) within the areas targeted by this assay, and inadequate number of viral copies(<138 copies/mL). A negative result must be combined with clinical observations, patient history, and epidemiological information. The expected result is Negative.  Fact Sheet for Patients:  EntrepreneurPulse.com.au  Fact Sheet for Healthcare Providers:  IncredibleEmployment.be  This test is no t yet approved or cleared by the [place] FDA and  has been authorized for detection and/or diagnosis of SARS-CoV-2 by FDA under an Emergency Use Authorization (EUA).  This EUA will remain  in effect (meaning this test can be used) for the duration of the COVID-19 declaration under Section 564(b)(1) of the Act, 21 U.S.C.section 360bbb-3(b)(1), unless the authorization is terminated  or revoked sooner.       Influenza A by PCR NEGATIVE NEGATIVE Final   Influenza B by PCR NEGATIVE NEGATIVE Final    Comment: (NOTE) The Xpert Xpress SARS-CoV-2/FLU/RSV plus assay is intended as an aid in the diagnosis of influenza from Nasopharyngeal swab specimens and should not be used as a sole basis for treatment. Nasal washings and aspirates are unacceptable for Xpert Xpress SARS-CoV-2/FLU/RSV testing.  Fact Sheet for Patients: EntrepreneurPulse.com.au  Fact Sheet for Healthcare Providers: IncredibleEmployment.be  This test is not yet approved or cleared by the [place] and has been authorized for detection and/or diagnosis of SARS-CoV-2 by FDA under an Emergency Use Authorization (EUA). This EUA will remain in effect (meaning this test can be used) for the duration of the COVID-19 declaration under Section 564(b)(1) of the Act, 21 U.S.C. section 360bbb-3(b)(1), unless the authorization is terminated or revoked.  Performed at [facility], [address] [place],  [address]     Radiology Reports DG Chest [place] 1 View  Result Date: 05/26/2020 CLINICAL DATA:  Short of breath.  Lower extremity edema EXAM: PORTABLE CHEST 1 VIEW COMPARISON:  November 11, 2011 FINDINGS: There is cardiomegaly with mild pulmonary venous hypertension. There is no appreciable edema or airspace opacity. No adenopathy. No bone lesions. IMPRESSION: Cardiomegaly with a degree of pulmonary vascular congestion. No edema or airspace opacity. Electronically Signed   By: Lowella Grip III M.D.   On: 05/26/2020 16:14     CBC Recent Labs  Lab 05/26/20 1544  WBC 6.0  HGB 15.1  HCT 46.0  PLT 108*  MCV 113.9*  MCH 37.4*  MCHC 32.8  RDW  15.4  LYMPHSABS 1.1  MONOABS 0.7  EOSABS 0.6*  BASOSABS 0.1    Chemistries  Recent Labs  Lab 05/26/20 1544 05/26/20 2100 05/27/20 0559  NA 123* 125* 128*  K 4.4 4.3 4.3  CL 86* 85* 85*  CO2 32 31 34*  GLUCOSE 140* 114* 117*  BUN 10 10 10   CREATININE 0.78 0.84 0.72  CALCIUM 8.1* 7.9* 8.2*  AST 28  --   --   ALT 30  --   --   ALKPHOS 84  --   --   BILITOT 0.5  --   --    ------------------------------------------------------------------------------------------------------------------ No results for input(s): CHOL, HDL, LDLCALC, TRIG, CHOLHDL, LDLDIRECT in the last 72 hours.  No results found for: HGBA1C ------------------------------------------------------------------------------------------------------------------ No results for input(s): TSH, T4TOTAL, T3FREE, THYROIDAB in the last 72 hours.  Invalid input(s): FREET3 ------------------------------------------------------------------------------------------------------------------ No results for input(s): VITAMINB12, FOLATE, FERRITIN, TIBC, IRON, RETICCTPCT in the last 72 hours.  Coagulation profile Recent Labs  Lab 05/26/20 1544  INR 1.1    No results for input(s): DDIMER in the last 72 hours.  Cardiac Enzymes No results for input(s): CKMB, TROPONINI, MYOGLOBIN in the last 168 hours.  Invalid input(s): CK ------------------------------------------------------------------------------------------------------------------    Component Value Date/Time   BNP 733.0 (H) 05/26/2020 1544     Roxan Hockey M.D on 05/27/2020 at 5:14 PM  Go to [email or web address] - for contact info  [facility] - Office  [phone]

## 2020-05-27 NOTE — Progress Notes (Signed)
Patient significant other requested to speak with charge nurse about current status of patient. Provided update. Explained that patient is on ETOH withdrawal protocol d/t ETOH abuse. States "You don't get withdrawals from alcohol."Significant other states "That ya'll ain't telling me nothing. He is sicker than what ya'll telling me"   Wanted to speak with physician. Stated that attending is currently not in house. Only Hospitalist covering night shift. Significant other has already called multiple times this shift. States that wants MD to call her with regular updates along with patient's sister. Explained that MD may not be able to call multiple family members d/t patient care requirements. SO expressed understanding of this. Patient has had multiple calls sent to room from [place] and family. Patient is currently asleep at this time. Notified family that we do not interrupt sleep/rest of patient for phone calls at this time. Note left for attending on patient chart.

## 2020-05-28 ENCOUNTER — Ambulatory Visit: Payer: Medicare Other | Admitting: Gastroenterology

## 2020-05-28 ENCOUNTER — Inpatient Hospital Stay (HOSPITAL_COMMUNITY): Payer: Medicare HMO

## 2020-05-28 DIAGNOSIS — I5033 Acute on chronic diastolic (congestive) heart failure: Secondary | ICD-10-CM | POA: Diagnosis not present

## 2020-05-28 LAB — BASIC METABOLIC PANEL
Anion gap: 11 (ref 5–15)
BUN: 10 mg/dL (ref 6–20)
CO2: 38 mmol/L — ABNORMAL HIGH (ref 22–32)
Calcium: 8.1 mg/dL — ABNORMAL LOW (ref 8.9–10.3)
Chloride: 82 mmol/L — ABNORMAL LOW (ref 98–111)
Creatinine, Ser: 0.73 mg/dL (ref 0.61–1.24)
GFR, Estimated: >60 mL/min (ref >60)
Glucose, Bld: 101 mg/dL — ABNORMAL HIGH (ref 70–99)
Potassium: 4.2 mmol/L (ref 3.5–5.1)
Sodium: 131 mmol/L — ABNORMAL LOW (ref 135–145)

## 2020-05-28 LAB — LIPASE, BLOOD: Lipase: 21 U/L (ref 11–51)

## 2020-05-28 LAB — URINALYSIS, ROUTINE W REFLEX MICROSCOPIC
Bilirubin Urine: NEGATIVE
Glucose, UA: NEGATIVE mg/dL
Hgb urine dipstick: NEGATIVE
Ketones, ur: NEGATIVE mg/dL
Leukocytes,Ua: NEGATIVE
Nitrite: NEGATIVE
Protein, ur: NEGATIVE mg/dL
Specific Gravity, Urine: 1.015 (ref 1.005–1.030)
pH: 6 (ref 5.0–8.0)

## 2020-05-28 LAB — RENAL FUNCTION PANEL
Albumin: 3.2 g/dL — ABNORMAL LOW (ref 3.5–5.0)
Anion gap: 11 (ref 5–15)
BUN: 11 mg/dL (ref 6–20)
CO2: 37 mmol/L — ABNORMAL HIGH (ref 22–32)
Calcium: 8.1 mg/dL — ABNORMAL LOW (ref 8.9–10.3)
Chloride: 82 mmol/L — ABNORMAL LOW (ref 98–111)
Creatinine, Ser: 0.75 mg/dL (ref 0.61–1.24)
GFR, Estimated: >60 mL/min (ref >60)
Glucose, Bld: 101 mg/dL — ABNORMAL HIGH (ref 70–99)
Phosphorus: 3.9 mg/dL (ref 2.5–4.6)
Potassium: 4.1 mmol/L (ref 3.5–5.1)
Sodium: 130 mmol/L — ABNORMAL LOW (ref 135–145)

## 2020-05-28 LAB — GLUCOSE, CAPILLARY
Glucose-Capillary: 116 mg/dL — ABNORMAL HIGH (ref 70–99)
Glucose-Capillary: 208 mg/dL — ABNORMAL HIGH (ref 70–99)

## 2020-05-28 LAB — MAGNESIUM: Magnesium: 1.7 mg/dL (ref 1.7–2.4)

## 2020-05-28 MED ORDER — IPRATROPIUM-ALBUTEROL 0.5-2.5 (3) MG/3ML IN SOLN
3.0000 mL | RESPIRATORY_TRACT | Status: DC | PRN
  Administered 2020-05-28: 3 mL via RESPIRATORY_TRACT
  Filled 2020-05-28: qty 3

## 2020-05-28 MED ORDER — FUROSEMIDE 10 MG/ML IJ SOLN
60.0000 mg | Freq: Once | INTRAMUSCULAR | Status: AC
  Administered 2020-05-28: 60 mg via INTRAVENOUS
  Filled 2020-05-28: qty 6

## 2020-05-28 MED ORDER — ACETAMINOPHEN 325 MG PO TABS
650.0000 mg | ORAL_TABLET | Freq: Four times a day (QID) | ORAL | Status: DC | PRN
  Administered 2020-05-28: 650 mg via ORAL
  Filled 2020-05-28 (×2): qty 2

## 2020-05-28 MED ORDER — DM-GUAIFENESIN ER 30-600 MG PO TB12
1.0000 | ORAL_TABLET | Freq: Two times a day (BID) | ORAL | Status: DC
  Administered 2020-05-28 – 2020-06-06 (×19): 1 via ORAL
  Filled 2020-05-28 (×19): qty 1

## 2020-05-28 MED ORDER — NICOTINE 14 MG/24HR TD PT24
14.0000 mg | MEDICATED_PATCH | Freq: Every day | TRANSDERMAL | Status: DC
  Administered 2020-05-28 – 2020-06-04 (×8): 14 mg via TRANSDERMAL
  Filled 2020-05-28 (×9): qty 1

## 2020-05-28 MED ORDER — MAGNESIUM SULFATE 2 GM/50ML IV SOLN
2.0000 g | Freq: Once | INTRAVENOUS | Status: AC
  Administered 2020-05-28: 2 g via INTRAVENOUS
  Filled 2020-05-28: qty 50

## 2020-05-28 MED ORDER — FUROSEMIDE 10 MG/ML IJ SOLN
40.0000 mg | Freq: Two times a day (BID) | INTRAMUSCULAR | Status: DC
  Administered 2020-05-28 – 2020-05-30 (×4): 40 mg via INTRAVENOUS
  Filled 2020-05-28 (×4): qty 4

## 2020-05-28 NOTE — Progress Notes (Signed)
Patient has a temp of 101. Secure chat sent to MD.

## 2020-05-28 NOTE — Progress Notes (Signed)
2:20 AM Nurse called to say that patient was febrile.  Tylenol was started, fever resolved  6:26 AM Nurse called to say that patient has expiratory wheezing and sounds more congested.  DuoNebs as needed, Mucinex, incentive spirometry initiated.  Chest x-ray was ordered and pending

## 2020-05-28 NOTE — Progress Notes (Signed)
Patient Demographics:    William Barrett, is a 60 y.o. male, DOB - Jul 06, 1960, LYY:[id number]  Admit date - 05/26/2020   Admitting Physician Truett Mainland, DO  Outpatient Primary MD for the patient is [name], MD  LOS - 2   Chief Complaint  Patient presents with  . Shortness of Breath        Subjective:    William Barrett today has no no emesis,  No chest pain,  --- Patient sister is on the phone in the room -Had fevers overnight -Increased oxygen requirement now up to 6 L,  -Cough and congestion persist  Assessment  & Plan :    Principal Problem:   Acute on chronic heart failure with preserved ejection fraction (HFpEF)/Acute on Chronic Diastolic HF Active Problems:   Acute respiratory failure with hypoxia (HCC)   Chronic alcohol abuse   Essential hypertension   Hyponatremia   Alcoholic Cirrhosis ([place])  Brief Summary:- 60 y.o. male with a history of hypertension, chronic alcohol use, alcoholic cirrhosis, heart failure with preserved EF  admitted on 05/26/2020 with worsening shortness of breath and acute hypoxic respiratory failure after running out of diuretics.  A/p 1)HFpEF--acute on chronic diastolic dysfunction CHF--- PTA patient was noncompliant with diuretic -Patient has high salt intake and drinks lots of beer -Increasing dyspnea increasing hypoxia with increasing oxygen requirement up to 6 L -Repeat chest x-ray on 05/28/2020 consistent with CHF -IV Lasix adjusted -Daily weight and fluid input and output monitoring -Chest x-ray and BNP consistent with CHF exacerbation -Echo with EF over 75%, hyperdynamic left ventricle noted severe LVH, no regional wall motion abnormalities -Fluid balance is negative and weight is trending down with diuresis  2) alcohol withdrawal--- patient with tremors, some confusional concerns -Benzos as ordered -Folic acid and thiamine as  ordered  3) alcoholic liver disease--- low platelets and high MCV noted -Ammonia is 49, INR and LFTs are not elevated -Lactulose as ordered  4) acute hypoxic respiratory failure----secondary to #1 above, should improve with diuresis  5) hyponatremia--- multifactorial, partly due to beer potomania compounded by hemodilution from CHF and liver cirrhosis -Sodium is up to 128 from 123 continue gentle diuresis  6) fevers overnight--- chest x-ray consistent with CHF rather than pneumonia, check UA  Disposition/Need for in-Hospital Stay- patient unable to be discharged at this time due to --acute CHF exacerbation requiring IV diuresis, acute hypoxic respiratory failure requiring supplemental oxygen*  Status is: Inpatient  Remains inpatient appropriate because:Please see above   Disposition: The patient is from: Home              Anticipated d/c is to: Home              Anticipated d/c date is: 2 days              Patient currently is not medically stable to d/c. Barriers: Not Clinically Stable-   Code Status :  -  Code Status: Full Code   Family Communication:    (patient is alert, awake and coherent)  Discussed with patient's sister who patient lives with  Consults  :    DVT Prophylaxis  :   - SCDs  enoxaparin (LOVENOX) injection 40 mg Start: 05/26/20 2015   Lab  Results  Component Value Date   PLT 108 (L) 05/26/2020    Inpatient Medications  Scheduled Meds: . dextromethorphan-guaiFENesin  1 tablet Oral BID  . enoxaparin (LOVENOX) injection  40 mg Subcutaneous Q24H  . folic acid  1 mg Oral Daily  . furosemide  40 mg Intravenous BID  . furosemide  60 mg Intravenous Once  . lactulose  30 g Oral Daily  . metoprolol tartrate  25 mg Oral BID  . multivitamin with minerals  1 tablet Oral Daily  . nicotine  14 mg Transdermal Daily  . potassium chloride  40 mEq Oral BID  . thiamine  100 mg Oral Daily   Or  . thiamine  100 mg Intravenous Daily   Continuous Infusions: .  magnesium sulfate bolus IVPB     PRN Meds:.acetaminophen, ipratropium-albuterol, LORazepam **OR** LORazepam, ondansetron **OR** ondansetron (ZOFRAN) IV    Anti-infectives (From admission, onward)   None        Objective:   Vitals:   05/28/20 0343 05/28/20 0500 05/28/20 0617 05/28/20 0900  BP:   124/76 121/78  Pulse:   98 97  Resp:   17 (!) 26  Temp: 100.2 F (37.9 C)  98.3 F (36.8 C) 98.5 F (36.9 C)  TempSrc: Oral  Oral Oral  SpO2:   92% 95%  Weight:  110.6 kg    Height:        Wt Readings from Last 3 Encounters:  05/28/20 110.6 kg  04/01/19 81.6 kg  01/20/15 77.1 kg     Intake/Output Summary (Last 24 hours) at 05/28/2020 0928 Last data filed at 05/28/2020 3846 Gross per 24 hour  Intake 240 ml  Output 3003 ml  Net -2763 ml     Physical Exam  Gen:- Awake Alert, dyspnea on exertion noted HEENT:- [place].AT, No sclera icterus Nose- [place] 6L/min Neck-Supple Neck +ve JVD,.  Lungs-diminished breath sounds, bibasilar rales  CV- S1, S2 normal, regular  Abd-  +ve B.Sounds, Abd Soft, No tenderness,    Extremity/Skin:- 2 +  edema, pedal pulses present  Psych-affect is appropriate, oriented x3 Neuro-no new focal deficits, + tremors   Data Review:   Micro Results Recent Results (from the past 240 hour(s))  Resp Panel by RT-PCR (Flu A&B, Covid) Nasopharyngeal Swab     Status: None   Collection Time: 05/26/20  4:13 PM   Specimen: Nasopharyngeal Swab; Nasopharyngeal(NP) swabs in vial transport medium  Result Value Ref Range Status   SARS Coronavirus 2 by RT PCR NEGATIVE NEGATIVE Final    Comment: (NOTE) SARS-CoV-2 target nucleic acids are NOT DETECTED.  The SARS-CoV-2 RNA is generally detectable in upper respiratory specimens during the acute phase of infection. The lowest concentration of SARS-CoV-2 viral copies this assay can detect is 138 copies/mL. A negative result does not preclude SARS-Cov-2 infection and should not be used as the sole basis for treatment  or other patient management decisions. A negative result may occur with  improper specimen collection/handling, submission of specimen other than nasopharyngeal swab, presence of viral mutation(s) within the areas targeted by this assay, and inadequate number of viral copies(<138 copies/mL). A negative result must be combined with clinical observations, patient history, and epidemiological information. The expected result is Negative.  Fact Sheet for Patients:  EntrepreneurPulse.com.au  Fact Sheet for Healthcare Providers:  IncredibleEmployment.be  This test is no t yet approved or cleared by the [place] FDA and  has been authorized for detection and/or diagnosis of SARS-CoV-2 by FDA under an Emergency Use  Authorization (EUA). This EUA will remain  in effect (meaning this test can be used) for the duration of the COVID-19 declaration under Section 564(b)(1) of the Act, 21 U.S.C.section 360bbb-3(b)(1), unless the authorization is terminated  or revoked sooner.       Influenza A by PCR NEGATIVE NEGATIVE Final   Influenza B by PCR NEGATIVE NEGATIVE Final    Comment: (NOTE) The Xpert Xpress SARS-CoV-2/FLU/RSV plus assay is intended as an aid in the diagnosis of influenza from Nasopharyngeal swab specimens and should not be used as a sole basis for treatment. Nasal washings and aspirates are unacceptable for Xpert Xpress SARS-CoV-2/FLU/RSV testing.  Fact Sheet for Patients: EntrepreneurPulse.com.au  Fact Sheet for Healthcare Providers: IncredibleEmployment.be  This test is not yet approved or cleared by the [place] FDA and has been authorized for detection and/or diagnosis of SARS-CoV-2 by FDA under an Emergency Use Authorization (EUA). This EUA will remain in effect (meaning this test can be used) for the duration of the COVID-19 declaration under Section 564(b)(1) of the Act, 21 U.S.C. section  360bbb-3(b)(1), unless the authorization is terminated or revoked.  Performed at [facility], [address], [place], [place] [address]     Radiology Reports DG Chest [place] 1 View  Result Date: 05/28/2020 CLINICAL DATA:  Shortness of breath. EXAM: PORTABLE CHEST 1 VIEW COMPARISON:  May 26, 2020. FINDINGS: Stable cardiomegaly with mild central pulmonary vascular congestion. No pneumothorax or pleural effusion is noted. No consolidative process is noted. Bony thorax is unremarkable. IMPRESSION: Stable cardiomegaly with mild central pulmonary vascular congestion. Electronically Signed   By: Marijo Conception M.D.   On: 05/28/2020 08:20   DG Chest Port 1 View  Result Date: 05/26/2020 CLINICAL DATA:  Short of breath.  Lower extremity edema EXAM: PORTABLE CHEST 1 VIEW COMPARISON:  November 11, 2011 FINDINGS: There is cardiomegaly with mild pulmonary venous hypertension. There is no appreciable edema or airspace opacity. No adenopathy. No bone lesions. IMPRESSION: Cardiomegaly with a degree of pulmonary vascular congestion. No edema or airspace opacity. Electronically Signed   By: Lowella Grip III M.D.   On: 05/26/2020 16:14   ECHOCARDIOGRAM COMPLETE  Result Date: 05/27/2020    ECHOCARDIOGRAM REPORT   Patient Name:   REYNOLDO MAINER Date of Exam: 05/27/2020 Medical Rec #:  [id number]         Height:       68.0 in Accession #:    [id number]        Weight:       251.8 lb Date of Birth:  1960/03/18         BSA:          2.254 m Patient Age:    79 years          BP:           126/87 mmHg Patient Gender: M                 HR:           84 bpm. Exam Location:  [facility] Procedure: 2D Echo Indications:    Congestive Heart Failure I50.9  History:        Patient has prior history of Echocardiogram examinations, most                 recent 12/30/2012. CHF; Risk Factors:Current Smoker and                 Hypertension. Chronic alcohol abuse, Acute respiratory failure  with hypoxia.  Sonographer:     Leavy Cella RDCS (AE) Referring Phys: [place]  1. Left ventricular ejection fraction, by estimation, is >75%. The left ventricle has hyperdynamic function. The left ventricle has no regional wall motion abnormalities. There is severe left ventricular hypertrophy. Left ventricular diastolic parameters were normal.  2. Right ventricular systolic function is mildly reduced. The right ventricular size is mildly enlarged.  3. Right atrial size was mildly dilated.  4. The aortic valve is tricuspid. Aortic valve regurgitation is not visualized. Mild aortic valve sclerosis is present, with no evidence of aortic valve stenosis.  5. The inferior vena cava is normal in size with greater than 50% respiratory variability, suggesting right atrial pressure of 3 mmHg. FINDINGS  Left Ventricle: Left ventricular ejection fraction, by estimation, is >75%. The left ventricle has hyperdynamic function. The left ventricle has no regional wall motion abnormalities. The left ventricular internal cavity size was normal in size. There is severe left ventricular hypertrophy. Left ventricular diastolic parameters were normal. Right Ventricle: The right ventricular size is mildly enlarged. Right vetricular wall thickness was not assessed. Right ventricular systolic function is mildly reduced. Left Atrium: Left atrial size was normal in size. Right Atrium: Right atrial size was mildly dilated. Pericardium: There is no evidence of pericardial effusion. Mitral Valve: The mitral valve is abnormal. There is mild thickening of the mitral valve leaflet(s). Mild mitral annular calcification. Trivial mitral valve regurgitation. Tricuspid Valve: The tricuspid valve is normal in structure. Tricuspid valve regurgitation is mild. Aortic Valve: The aortic valve is tricuspid. Aortic valve regurgitation is not visualized. Mild aortic valve sclerosis is present, with no evidence of aortic valve stenosis. Aortic valve mean gradient  measures 11.7 mmHg. Aortic valve peak gradient measures 16.8 mmHg. Aortic valve area, by VTI measures 3.11 cm. Pulmonic Valve: The pulmonic valve was normal in structure. Pulmonic valve regurgitation is not visualized. Aorta: The aortic root and ascending aorta are structurally normal, with no evidence of dilitation. Venous: The inferior vena cava is normal in size with greater than 50% respiratory variability, suggesting right atrial pressure of 3 mmHg.  LEFT VENTRICLE PLAX 2D LVIDd:         4.58 cm  Diastology LVIDs:         2.22 cm  LV e' medial:    8.38 cm/s LV PW:         1.84 cm  LV E/e' medial:  13.7 LV IVS:        1.47 cm  LV e' lateral:   10.20 cm/s LVOT diam:     2.10 cm  LV E/e' lateral: 11.3 LV SV:         134 LV SV Index:   60 LVOT Area:     3.46 cm  RIGHT VENTRICLE RV S prime:     14.10 cm/s TAPSE (M-mode): 2.8 cm LEFT ATRIUM             Index       RIGHT ATRIUM           Index LA diam:        4.90 cm 2.17 cm/m  RA Area:     20.00 cm LA Vol (A2C):   75.6 ml 33.54 ml/m RA Volume:   59.40 ml  26.36 ml/m LA Vol (A4C):   48.8 ml 21.65 ml/m LA Biplane Vol: 63.3 ml 28.09 ml/m  AORTIC VALVE AV Area (Vmax):    2.97 cm AV Area (Vmean):  2.92 cm AV Area (VTI):     3.11 cm AV Vmax:           205.00 cm/s AV Vmean:          164.333 cm/s AV VTI:            0.431 m AV Peak Grad:      16.8 mmHg AV Mean Grad:      11.7 mmHg LVOT Vmax:         176.00 cm/s LVOT Vmean:        138.667 cm/s LVOT VTI:          0.387 m LVOT/AV VTI ratio: 0.90  AORTA Ao Root diam: 3.40 cm MITRAL VALVE                TRICUSPID VALVE MV Area (PHT): 2.99 cm     TR Peak grad:   29.4 mmHg MV Decel Time: 254 msec     TR Vmax:        271.00 cm/s MR Peak grad: 62.1 mmHg MR Vmax:      394.00 cm/s   SHUNTS MV E velocity: 115.00 cm/s  Systemic VTI:  0.39 m MV A velocity: 86.70 cm/s   Systemic Diam: 2.10 cm MV E/A ratio:  1.33 Dorris Carnes MD Electronically signed by Dorris Carnes MD Signature Date/Time: 05/27/2020/6:36:32 PM    Final       CBC Recent Labs  Lab 05/26/20 1544  WBC 6.0  HGB 15.1  HCT 46.0  PLT 108*  MCV 113.9*  MCH 37.4*  MCHC 32.8  RDW 15.4  LYMPHSABS 1.1  MONOABS 0.7  EOSABS 0.6*  BASOSABS 0.1    Chemistries  Recent Labs  Lab 05/26/20 1544 05/26/20 2100 05/27/20 0559 05/28/20 0535  NA 123* 125* 128* 130*  131*  K 4.4 4.3 4.3 4.1  4.2  CL 86* 85* 85* 82*  82*  CO2 32 31 34* 37*  38*  GLUCOSE 140* 114* 117* 101*  101*  BUN 10 10 10 11  10   CREATININE 0.78 0.84 0.72 0.75  0.73  CALCIUM 8.1* 7.9* 8.2* 8.1*  8.1*  MG  --   --   --  1.7  AST 28  --   --   --   ALT 30  --   --   --   ALKPHOS 84  --   --   --   BILITOT 0.5  --   --   --    ------------------------------------------------------------------------------------------------------------------ No results for input(s): CHOL, HDL, LDLCALC, TRIG, CHOLHDL, LDLDIRECT in the last 72 hours.  No results found for: HGBA1C ------------------------------------------------------------------------------------------------------------------ No results for input(s): TSH, T4TOTAL, T3FREE, THYROIDAB in the last 72 hours.  Invalid input(s): FREET3 ------------------------------------------------------------------------------------------------------------------ No results for input(s): VITAMINB12, FOLATE, FERRITIN, TIBC, IRON, RETICCTPCT in the last 72 hours.  Coagulation profile Recent Labs  Lab 05/26/20 1544  INR 1.1    No results for input(s): DDIMER in the last 72 hours.  Cardiac Enzymes No results for input(s): CKMB, TROPONINI, MYOGLOBIN in the last 168 hours.  Invalid input(s): CK ------------------------------------------------------------------------------------------------------------------    Component Value Date/Time   BNP 733.0 (H) 05/26/2020 1544     Roxan Hockey M.D on 05/28/2020 at 9:28 AM  Go to [email or web address] - for contact info  [facility] - Office  [phone]

## 2020-05-28 NOTE — Progress Notes (Signed)
Telemetry just called and said that patient had a burst of SVT around 2349.

## 2020-05-29 DIAGNOSIS — I5033 Acute on chronic diastolic (congestive) heart failure: Secondary | ICD-10-CM | POA: Diagnosis not present

## 2020-05-29 LAB — RENAL FUNCTION PANEL
Albumin: 2.9 g/dL — ABNORMAL LOW (ref 3.5–5.0)
Anion gap: 12 (ref 5–15)
BUN: 12 mg/dL (ref 6–20)
CO2: 40 mmol/L — ABNORMAL HIGH (ref 22–32)
Calcium: 8.1 mg/dL — ABNORMAL LOW (ref 8.9–10.3)
Chloride: 80 mmol/L — ABNORMAL LOW (ref 98–111)
Creatinine, Ser: 0.65 mg/dL (ref 0.61–1.24)
GFR, Estimated: >60 mL/min (ref >60)
Glucose, Bld: 92 mg/dL (ref 70–99)
Phosphorus: 2.1 mg/dL — ABNORMAL LOW (ref 2.5–4.6)
Potassium: 3.2 mmol/L — ABNORMAL LOW (ref 3.5–5.1)
Sodium: 132 mmol/L — ABNORMAL LOW (ref 135–145)

## 2020-05-29 LAB — CBC
HCT: 46.3 % (ref 39.0–52.0)
Hemoglobin: 14.8 g/dL (ref 13.0–17.0)
MCH: 37 pg — ABNORMAL HIGH (ref 26.0–34.0)
MCHC: 32 g/dL (ref 30.0–36.0)
MCV: 115.8 fL — ABNORMAL HIGH (ref 80.0–100.0)
Platelets: 81 10*3/uL — ABNORMAL LOW (ref 150–400)
RBC: 4 MIL/uL — ABNORMAL LOW (ref 4.22–5.81)
RDW: 14.9 % (ref 11.5–15.5)
WBC: 6.3 10*3/uL (ref 4.0–10.5)
nRBC: 0 % (ref 0.0–0.2)

## 2020-05-29 LAB — BASIC METABOLIC PANEL
Anion gap: 12 (ref 5–15)
BUN: 12 mg/dL (ref 6–20)
CO2: 39 mmol/L — ABNORMAL HIGH (ref 22–32)
Calcium: 8 mg/dL — ABNORMAL LOW (ref 8.9–10.3)
Chloride: 81 mmol/L — ABNORMAL LOW (ref 98–111)
Creatinine, Ser: 0.66 mg/dL (ref 0.61–1.24)
GFR, Estimated: >60 mL/min (ref >60)
Glucose, Bld: 93 mg/dL (ref 70–99)
Potassium: 3.2 mmol/L — ABNORMAL LOW (ref 3.5–5.1)
Sodium: 132 mmol/L — ABNORMAL LOW (ref 135–145)

## 2020-05-29 MED ORDER — FUROSEMIDE 10 MG/ML IJ SOLN
40.0000 mg | Freq: Once | INTRAMUSCULAR | Status: AC
  Administered 2020-05-29: 40 mg via INTRAVENOUS
  Filled 2020-05-29: qty 4

## 2020-05-29 MED ORDER — POTASSIUM CHLORIDE CRYS ER 20 MEQ PO TBCR
40.0000 meq | EXTENDED_RELEASE_TABLET | Freq: Once | ORAL | Status: AC
  Administered 2020-05-29: 40 meq via ORAL
  Filled 2020-05-29: qty 2

## 2020-05-29 NOTE — Progress Notes (Signed)
Patient Demographics:    William Barrett, is a 60 y.o. male, DOB - [birth year], MGQ:[id number]  Admit date - 05/26/2020   Admitting Physician Truett Mainland, DO  Outpatient Primary MD for the patient is [name], MD  LOS - 3   Chief Complaint  Patient presents with  . Shortness of Breath        Subjective:    William Barrett today has  No chest pain,  --- No further emesis -Cough and shortness of breath persist--continues to require 6 L of oxygen via nasal cannula --Voiding okay -No fevers no significant abdominal pain and lipase was WNL  Assessment  & Plan :    Principal Problem:   Acute on chronic heart failure with preserved ejection fraction (HFpEF)/Acute on Chronic Diastolic HF Active Problems:   Acute respiratory failure with hypoxia (HCC)   Chronic alcohol abuse   Essential hypertension   Hyponatremia   Alcoholic Cirrhosis (HCC)  Brief Summary:- 60 y.o. male with a history of hypertension, chronic alcohol use, alcoholic cirrhosis, heart failure with preserved EF  admitted on 05/26/2020 with worsening shortness of breath and acute hypoxic respiratory failure after running out of diuretics.  A/p 1)HFpEF--acute on chronic diastolic dysfunction CHF--- PTA patient was noncompliant with diuretic -Patient has high salt intake and drinks lots of beer -Dyspnea and hypoxia persist -Additional dose of IV Lasix given -Repeat chest x-ray on 05/30/2020 -Daily weight and fluid input and output monitoring -Chest x-ray and BNP consistent with CHF exacerbation -Echo with EF over 75%, hyperdynamic left ventricle noted severe LVH, no regional wall motion abnormalities -Fluid balance is negative and weight is trending down with diuresis  2) alcohol withdrawal--- DTs improving clinically  -Benzos as ordered -Folic acid and thiamine as ordered  3) alcoholic liver disease--- low platelets and  high MCV noted -Ammonia is 49, INR and LFTs are not elevated -Treated with lactulose   4) acute hypoxic respiratory failure----secondary to #1 above, should improve with diuresis  -Chest x-ray on 05/30/2020  5) hyponatremia--- multifactorial, partly due to beer potomania compounded by hemodilution from CHF and liver cirrhosis -Sodium is up to 132 from 123 continue gentle diuresis  6) fevers overnight--- chest x-ray consistent with CHF rather than pneumonia, -UA is not suggestive of UTI -Fevers have resolved  Disposition/Need for in-Hospital Stay- patient unable to be discharged at this time due to --acute CHF exacerbation requiring IV diuresis, acute hypoxic respiratory failure requiring supplemental oxygen*  Status is: Inpatient  Remains inpatient appropriate because:Please see above   Disposition: The patient is from: Home              Anticipated d/c is to: Home              Anticipated d/c date is: 2 days              Patient currently is not medically stable to d/c. Barriers: Not Clinically Stable-   Code Status :  -  Code Status: Full Code   Family Communication:    (patient is alert, awake and coherent)  Discussed with patient's sister who patient lives with  Consults  :    DVT Prophylaxis  :   - SCDs  enoxaparin (LOVENOX) injection 40 mg Start:  05/26/20 2015   Lab Results  Component Value Date   PLT 81 (L) 05/29/2020    Inpatient Medications  Scheduled Meds: . dextromethorphan-guaiFENesin  1 tablet Oral BID  . enoxaparin (LOVENOX) injection  40 mg Subcutaneous Q24H  . folic acid  1 mg Oral Daily  . furosemide  40 mg Intravenous BID  . furosemide  40 mg Intravenous Once  . metoprolol tartrate  25 mg Oral BID  . multivitamin with minerals  1 tablet Oral Daily  . nicotine  14 mg Transdermal Daily  . potassium chloride  40 mEq Oral BID  . thiamine  100 mg Oral Daily   Or  . thiamine  100 mg Intravenous Daily   Continuous Infusions:  PRN  Meds:.acetaminophen, ipratropium-albuterol, LORazepam **OR** LORazepam, ondansetron **OR** ondansetron (ZOFRAN) IV    Anti-infectives (From admission, onward)   None        Objective:   Vitals:   05/28/20 2038 05/29/20 0701 05/29/20 1341 05/29/20 1341  BP: 107/74 106/70 96/68   Pulse: 93 87 76   Resp: 18 19  18   Temp: 98.5 F (36.9 C) 98.2 F (36.8 C) 97.9 F (36.6 C)   TempSrc: Oral  Oral   SpO2: 90% 92% 93%   Weight:      Height:        Wt Readings from Last 3 Encounters:  05/28/20 110.6 kg  04/01/19 81.6 kg  01/20/15 77.1 kg     Intake/Output Summary (Last 24 hours) at 05/29/2020 1350 Last data filed at 05/29/2020 0900 Gross per 24 hour  Intake 720 ml  Output 2600 ml  Net -1880 ml     Physical Exam  Gen:- Awake Alert, dyspnea on exertion noted HEENT:- [place].AT, No sclera icterus Nose-  6L/min Neck-Supple Neck +ve JVD,.  Lungs-diminished breath sounds, bibasilar rales  CV- S1, S2 normal, regular  Abd-  +ve B.Sounds, Abd Soft, No tenderness,    Extremity/Skin:- 2 +  edema, pedal pulses present  Psych-affect is appropriate, oriented x3 Neuro-generalized weakness, no new focal deficits, mostly resolved tremors   Data Review:   Micro Results Recent Results (from the past 240 hour(s))  Resp Panel by RT-PCR (Flu A&B, Covid) Nasopharyngeal Swab     Status: None   Collection Time: 05/26/20  4:13 PM   Specimen: Nasopharyngeal Swab; Nasopharyngeal(NP) swabs in vial transport medium  Result Value Ref Range Status   SARS Coronavirus 2 by RT PCR NEGATIVE NEGATIVE Final    Comment: (NOTE) SARS-CoV-2 target nucleic acids are NOT DETECTED.  The SARS-CoV-2 RNA is generally detectable in upper respiratory specimens during the acute phase of infection. The lowest concentration of SARS-CoV-2 viral copies this assay can detect is 138 copies/mL. A negative result does not preclude SARS-Cov-2 infection and should not be used as the sole basis for treatment or other  patient management decisions. A negative result may occur with  improper specimen collection/handling, submission of specimen other than nasopharyngeal swab, presence of viral mutation(s) within the areas targeted by this assay, and inadequate number of viral copies(<138 copies/mL). A negative result must be combined with clinical observations, patient history, and epidemiological information. The expected result is Negative.  Fact Sheet for Patients:  EntrepreneurPulse.com.au  Fact Sheet for Healthcare Providers:  IncredibleEmployment.be  This test is no t yet approved or cleared by the [place] FDA and  has been authorized for detection and/or diagnosis of SARS-CoV-2 by FDA under an Emergency Use Authorization (EUA). This EUA will remain  in effect (meaning  this test can be used) for the duration of the COVID-19 declaration under Section 564(b)(1) of the Act, 21 U.S.C.section 360bbb-3(b)(1), unless the authorization is terminated  or revoked sooner.       Influenza A by PCR NEGATIVE NEGATIVE Final   Influenza B by PCR NEGATIVE NEGATIVE Final    Comment: (NOTE) The Xpert Xpress SARS-CoV-2/FLU/RSV plus assay is intended as an aid in the diagnosis of influenza from Nasopharyngeal swab specimens and should not be used as a sole basis for treatment. Nasal washings and aspirates are unacceptable for Xpert Xpress SARS-CoV-2/FLU/RSV testing.  Fact Sheet for Patients: EntrepreneurPulse.com.au  Fact Sheet for Healthcare Providers: IncredibleEmployment.be  This test is not yet approved or cleared by the [place] FDA and has been authorized for detection and/or diagnosis of SARS-CoV-2 by FDA under an Emergency Use Authorization (EUA). This EUA will remain in effect (meaning this test can be used) for the duration of the COVID-19 declaration under Section 564(b)(1) of the Act, 21 U.S.C. section  360bbb-3(b)(1), unless the authorization is terminated or revoked.  Performed at [facility], [address], [place], [place] [address]     Radiology Reports DG Chest [place] 1 View  Result Date: 05/28/2020 CLINICAL DATA:  Shortness of breath. EXAM: PORTABLE CHEST 1 VIEW COMPARISON:  May 26, 2020. FINDINGS: Stable cardiomegaly with mild central pulmonary vascular congestion. No pneumothorax or pleural effusion is noted. No consolidative process is noted. Bony thorax is unremarkable. IMPRESSION: Stable cardiomegaly with mild central pulmonary vascular congestion. Electronically Signed   By: Marijo Conception M.D.   On: 05/28/2020 08:20   DG Chest Port 1 View  Result Date: 05/26/2020 CLINICAL DATA:  Short of breath.  Lower extremity edema EXAM: PORTABLE CHEST 1 VIEW COMPARISON:  November 11, 2011 FINDINGS: There is cardiomegaly with mild pulmonary venous hypertension. There is no appreciable edema or airspace opacity. No adenopathy. No bone lesions. IMPRESSION: Cardiomegaly with a degree of pulmonary vascular congestion. No edema or airspace opacity. Electronically Signed   By: Lowella Grip III M.D.   On: 05/26/2020 16:14   ECHOCARDIOGRAM COMPLETE  Result Date: 05/27/2020    ECHOCARDIOGRAM REPORT   Patient Name:   ITALO BANTON Date of Exam: 05/27/2020 Medical Rec #:  [id number]         Height:       68.0 in Accession #:    [id number]        Weight:       251.8 lb Date of Birth:  [birth year]         BSA:          2.254 m Patient Age:    71 years          BP:           126/87 mmHg Patient Gender: M                 HR:           84 bpm. Exam Location:  [facility] Procedure: 2D Echo Indications:    Congestive Heart Failure I50.9  History:        Patient has prior history of Echocardiogram examinations, most                 recent 12/30/2012. CHF; Risk Factors:Current Smoker and                 Hypertension. Chronic alcohol abuse, Acute respiratory failure  with hypoxia.  Sonographer:     Leavy Cella RDCS (AE) Referring Phys: [place]  1. Left ventricular ejection fraction, by estimation, is >75%. The left ventricle has hyperdynamic function. The left ventricle has no regional wall motion abnormalities. There is severe left ventricular hypertrophy. Left ventricular diastolic parameters were normal.  2. Right ventricular systolic function is mildly reduced. The right ventricular size is mildly enlarged.  3. Right atrial size was mildly dilated.  4. The aortic valve is tricuspid. Aortic valve regurgitation is not visualized. Mild aortic valve sclerosis is present, with no evidence of aortic valve stenosis.  5. The inferior vena cava is normal in size with greater than 50% respiratory variability, suggesting right atrial pressure of 3 mmHg. FINDINGS  Left Ventricle: Left ventricular ejection fraction, by estimation, is >75%. The left ventricle has hyperdynamic function. The left ventricle has no regional wall motion abnormalities. The left ventricular internal cavity size was normal in size. There is severe left ventricular hypertrophy. Left ventricular diastolic parameters were normal. Right Ventricle: The right ventricular size is mildly enlarged. Right vetricular wall thickness was not assessed. Right ventricular systolic function is mildly reduced. Left Atrium: Left atrial size was normal in size. Right Atrium: Right atrial size was mildly dilated. Pericardium: There is no evidence of pericardial effusion. Mitral Valve: The mitral valve is abnormal. There is mild thickening of the mitral valve leaflet(s). Mild mitral annular calcification. Trivial mitral valve regurgitation. Tricuspid Valve: The tricuspid valve is normal in structure. Tricuspid valve regurgitation is mild. Aortic Valve: The aortic valve is tricuspid. Aortic valve regurgitation is not visualized. Mild aortic valve sclerosis is present, with no evidence of aortic valve stenosis. Aortic valve mean gradient  measures 11.7 mmHg. Aortic valve peak gradient measures 16.8 mmHg. Aortic valve area, by VTI measures 3.11 cm. Pulmonic Valve: The pulmonic valve was normal in structure. Pulmonic valve regurgitation is not visualized. Aorta: The aortic root and ascending aorta are structurally normal, with no evidence of dilitation. Venous: The inferior vena cava is normal in size with greater than 50% respiratory variability, suggesting right atrial pressure of 3 mmHg.  LEFT VENTRICLE PLAX 2D LVIDd:         4.58 cm  Diastology LVIDs:         2.22 cm  LV e' medial:    8.38 cm/s LV PW:         1.84 cm  LV E/e' medial:  13.7 LV IVS:        1.47 cm  LV e' lateral:   10.20 cm/s LVOT diam:     2.10 cm  LV E/e' lateral: 11.3 LV SV:         134 LV SV Index:   60 LVOT Area:     3.46 cm  RIGHT VENTRICLE RV S prime:     14.10 cm/s TAPSE (M-mode): 2.8 cm LEFT ATRIUM             Index       RIGHT ATRIUM           Index LA diam:        4.90 cm 2.17 cm/m  RA Area:     20.00 cm LA Vol (A2C):   75.6 ml 33.54 ml/m RA Volume:   59.40 ml  26.36 ml/m LA Vol (A4C):   48.8 ml 21.65 ml/m LA Biplane Vol: 63.3 ml 28.09 ml/m  AORTIC VALVE AV Area (Vmax):    2.97 cm AV Area (Vmean):  2.92 cm AV Area (VTI):     3.11 cm AV Vmax:           205.00 cm/s AV Vmean:          164.333 cm/s AV VTI:            0.431 m AV Peak Grad:      16.8 mmHg AV Mean Grad:      11.7 mmHg LVOT Vmax:         176.00 cm/s LVOT Vmean:        138.667 cm/s LVOT VTI:          0.387 m LVOT/AV VTI ratio: 0.90  AORTA Ao Root diam: 3.40 cm MITRAL VALVE                TRICUSPID VALVE MV Area (PHT): 2.99 cm     TR Peak grad:   29.4 mmHg MV Decel Time: 254 msec     TR Vmax:        271.00 cm/s MR Peak grad: 62.1 mmHg MR Vmax:      394.00 cm/s   SHUNTS MV E velocity: 115.00 cm/s  Systemic VTI:  0.39 m MV A velocity: 86.70 cm/s   Systemic Diam: 2.10 cm MV E/A ratio:  1.33 Dorris Carnes MD Electronically signed by Dorris Carnes MD Signature Date/Time: 05/27/2020/6:36:32 PM    Final       CBC Recent Labs  Lab 05/26/20 1544 05/29/20 0545  WBC 6.0 6.3  HGB 15.1 14.8  HCT 46.0 46.3  PLT 108* 81*  MCV 113.9* 115.8*  MCH 37.4* 37.0*  MCHC 32.8 32.0  RDW 15.4 14.9  LYMPHSABS 1.1  --   MONOABS 0.7  --   EOSABS 0.6*  --   BASOSABS 0.1  --     Chemistries  Recent Labs  Lab 05/26/20 1544 05/26/20 2100 05/27/20 0559 05/28/20 0535 05/29/20 0545  NA 123* 125* 128* 130*  131* 132*  132*  K 4.4 4.3 4.3 4.1  4.2 3.2*  3.2*  CL 86* 85* 85* 82*  82* 80*  81*  CO2 32 31 34* 37*  38* 40*  39*  GLUCOSE 140* 114* 117* 101*  101* 92  93  BUN 10 10 10 11  10 12  12   CREATININE 0.78 0.84 0.72 0.75  0.73 0.65  0.66  CALCIUM 8.1* 7.9* 8.2* 8.1*  8.1* 8.1*  8.0*  MG  --   --   --  1.7  --   AST 28  --   --   --   --   ALT 30  --   --   --   --   ALKPHOS 84  --   --   --   --   BILITOT 0.5  --   --   --   --    ------------------------------------------------------------------------------------------------------------------ No results for input(s): CHOL, HDL, LDLCALC, TRIG, CHOLHDL, LDLDIRECT in the last 72 hours.  No results found for: HGBA1C ------------------------------------------------------------------------------------------------------------------ No results for input(s): TSH, T4TOTAL, T3FREE, THYROIDAB in the last 72 hours.  Invalid input(s): FREET3 ------------------------------------------------------------------------------------------------------------------ No results for input(s): VITAMINB12, FOLATE, FERRITIN, TIBC, IRON, RETICCTPCT in the last 72 hours.  Coagulation profile Recent Labs  Lab 05/26/20 1544  INR 1.1    No results for input(s): DDIMER in the last 72 hours.  Cardiac Enzymes No results for input(s): CKMB, TROPONINI, MYOGLOBIN in the last 168 hours.  Invalid input(s): CK ------------------------------------------------------------------------------------------------------------------    Component Value Date/Time  BNP  733.0 (H) 05/26/2020 1544   Roxan Hockey M.D on 05/29/2020 at 1:50 PM  Go to [email or web address] - for contact info  [facility] - Office  [phone]

## 2020-05-29 NOTE — TOC Initial Note (Signed)
Transition of Care [facility]) - Initial/Assessment Note    Patient Details  Name: William Barrett MRN: [id number] Date of Birth: November 11, 1960  [facility]) CM/SW Contact:    Salome Arnt, [place] Phone Number: 05/29/2020, 1:55 PM  Clinical Narrative: Pt admitted due to acute respiratory failure with hypoxia secondary to acute congestive heart failure. TOC received consult for substance abuse and CHF screening. Pt reports he lives with his sister, his sister's boyfriend, his nephew, and great niece. He indicates his girlfriend is his best support person. Pt said he is independent with ADLs. His family provides most meals. Pt plans to return home when medically stable.   LCSW discussed substance abuse with pt. He states he drinks 3 40 oz beers a day and has been drinking this amount for a "long, long time." When asked whether pt was interested in cutting back or stopping, he states "not really" and indicates he doesn't feel he drinks too much. LCSW provided list of outpatient substance abuse resources, although pt said he did not feel it was necessary.  LCSW completed CHF screening. Pt had several questions about CHF. LCSW provided some information and encouraged pt to discuss with MD further. Discussed importance of daily weights, following a heart healthy diet, and taking medications as prescribed. Pt reports understanding. TOC will continue to follow.                     Expected Discharge Plan: Home/Self Care Barriers to Discharge: Continued Medical Work up   Patient Goals and CMS Choice Patient states their goals for this hospitalization and ongoing recovery are:: return home      Expected Discharge Plan and Services Expected Discharge Plan: Home/Self Care In-house Referral: Clinical Social Work     Living arrangements for the past 2 months: Single Family Home                 DME Arranged: N/A DME Agency: NA                  Prior Living  Arrangements/Services Living arrangements for the past 2 months: Single Family Home Lives with:: Relatives Patient language and need for interpreter reviewed:: Yes Do you feel safe going back to the place where you live?: Yes      Need for Family Participation in Patient Care: No (Comment)   Current home services: DME (walker) Criminal Activity/Legal Involvement Pertinent to Current Situation/Hospitalization: No - Comment as needed  Activities of Daily Living Home Assistive Devices/Equipment: None ADL Screening (condition at time of admission) Patient's cognitive ability adequate to safely complete daily activities?: Yes Is the patient deaf or have difficulty hearing?: No Does the patient have difficulty seeing, even when wearing glasses/contacts?: No Does the patient have difficulty concentrating, remembering, or making decisions?: No Patient able to express need for assistance with ADLs?: Yes Does the patient have difficulty dressing or bathing?: No Independently performs ADLs?: Yes (appropriate for developmental age) Does the patient have difficulty walking or climbing stairs?: No Weakness of Legs: None Weakness of Arms/Hands: None  Permission Sought/Granted                  Emotional Assessment       Orientation: : Oriented to Self,Oriented to Place,Oriented to Situation,Oriented to  Time Alcohol / Substance Use: Alcohol Use Psych Involvement: No (comment)  Admission diagnosis:  Chronic pulmonary edema [J81.1] Peripheral edema [R60.9] Acute respiratory failure with hypoxia (HCC) [Z32.99] Alcoholic cirrhosis, unspecified whether  ascites present [facility]) [K70.30] Patient Active Problem List   Diagnosis Date Noted  . Acute on chronic heart failure with preserved ejection fraction (HFpEF)/Acute on Chronic Diastolic HF 16/43/5391  . Acute respiratory failure with hypoxia ([place]) 05/26/2020  . Acute CHF (congestive heart failure) ([place]) 05/26/2020  . Essential hypertension  05/26/2020  . Special screening for malignant neoplasms, colon 03/05/2014  . PUD (peptic ulcer disease) 03/05/2014    Class: History of  . Smoker 12/30/2012  . Shortness of breath 12/30/2012  . Chronic alcohol abuse 06/17/2012  . Hypotension 06/17/2012  . Tachycardia 06/17/2012  . Delirium tremens ([place]) 06/17/2012  . Abnormal LFTs 06/17/2012  . Hyponatremia 06/17/2012  . Hypokalemia 06/17/2012  . Dehydration 06/17/2012  . Recurrent falls 06/17/2012  . Metabolic acidosis, increased anion gap 06/17/2012  . Thrombocytopenia ([place]) 06/17/2012  . Alcoholic Cirrhosis ([place]) 22/58/3462  . Fracture of sternum 06/17/2012  . Diarrhea 06/17/2012   PCP:  [name], MD Pharmacy:   [place], [place] - [place] [address] [place] [address] Phone: [phone] Fax: [place], [place] [address] Phone: [phone] Fax: [phone]     Social Determinants of Health (SDOH) Interventions    Readmission Risk Interventions No flowsheet data found.

## 2020-05-29 NOTE — Care Management Important Message (Signed)
Important Message  Patient Details  Name: William Barrett MRN: [id number] Date of Birth: [birth year]   Medicare Important Message Given:  Yes     Tommy Medal 05/29/2020, 12:28 PM

## 2020-05-30 ENCOUNTER — Inpatient Hospital Stay (HOSPITAL_COMMUNITY): Payer: Medicare HMO

## 2020-05-30 DIAGNOSIS — I5033 Acute on chronic diastolic (congestive) heart failure: Secondary | ICD-10-CM | POA: Diagnosis not present

## 2020-05-30 LAB — BASIC METABOLIC PANEL
Anion gap: 11 (ref 5–15)
BUN: 13 mg/dL (ref 6–20)
CO2: 38 mmol/L — ABNORMAL HIGH (ref 22–32)
Calcium: 8 mg/dL — ABNORMAL LOW (ref 8.9–10.3)
Chloride: 82 mmol/L — ABNORMAL LOW (ref 98–111)
Creatinine, Ser: 0.72 mg/dL (ref 0.61–1.24)
GFR, Estimated: >60 mL/min (ref >60)
Glucose, Bld: 101 mg/dL — ABNORMAL HIGH (ref 70–99)
Potassium: 3.4 mmol/L — ABNORMAL LOW (ref 3.5–5.1)
Sodium: 131 mmol/L — ABNORMAL LOW (ref 135–145)

## 2020-05-30 MED ORDER — POLYETHYLENE GLYCOL 3350 17 G PO PACK
17.0000 g | PACK | Freq: Every day | ORAL | Status: DC
  Filled 2020-05-30 (×2): qty 1

## 2020-05-30 MED ORDER — SODIUM CHLORIDE 0.9 % IV SOLN
500.0000 mg | INTRAVENOUS | Status: DC
  Administered 2020-05-30 – 2020-06-03 (×5): 500 mg via INTRAVENOUS
  Filled 2020-05-30 (×5): qty 500

## 2020-05-30 MED ORDER — FUROSEMIDE 10 MG/ML IJ SOLN
40.0000 mg | Freq: Every day | INTRAMUSCULAR | Status: DC
  Administered 2020-05-31: 40 mg via INTRAVENOUS
  Filled 2020-05-30 (×2): qty 4

## 2020-05-30 MED ORDER — POTASSIUM CHLORIDE CRYS ER 20 MEQ PO TBCR
40.0000 meq | EXTENDED_RELEASE_TABLET | Freq: Three times a day (TID) | ORAL | Status: DC
  Administered 2020-05-30 – 2020-06-06 (×22): 40 meq via ORAL
  Filled 2020-05-30 (×21): qty 2

## 2020-05-30 MED ORDER — LIVING BETTER WITH HEART FAILURE BOOK: Freq: Once | Status: AC

## 2020-05-30 MED ORDER — SODIUM CHLORIDE 0.9 % IV SOLN
3.0000 g | Freq: Four times a day (QID) | INTRAVENOUS | Status: DC
  Administered 2020-05-30 – 2020-05-31 (×4): 3 g via INTRAVENOUS
  Filled 2020-05-30 (×3): qty 8
  Filled 2020-05-30: qty 3
  Filled 2020-05-30 (×10): qty 8
  Filled 2020-05-30 (×2): qty 3

## 2020-05-30 MED ORDER — BISACODYL 10 MG RE SUPP
10.0000 mg | Freq: Once | RECTAL | Status: DC
  Filled 2020-05-30: qty 1

## 2020-05-30 MED ORDER — METRONIDAZOLE IN NACL 5-0.79 MG/ML-% IV SOLN: 500.0000 mg | Freq: Three times a day (TID) | INTRAVENOUS | Status: DC

## 2020-05-30 MED ORDER — IOHEXOL 300 MG/ML  SOLN
100.0000 mL | Freq: Once | INTRAMUSCULAR | Status: AC | PRN
  Administered 2020-05-30: 100 mL via INTRAVENOUS

## 2020-05-30 MED ORDER — SODIUM CHLORIDE 0.9 % IV SOLN: 1.0000 g | INTRAVENOUS | Status: DC

## 2020-05-30 NOTE — Progress Notes (Signed)
-  CT abdomen and pelvis with cirrhosis, right-sided pneumonia--suspect aspiration given recent episodes of emesis in an alcoholic male, --CT also shows colitis extending from the hepatic flexure to the mid transverse colon --Treat empirically with Unasyn to cover for both colitis and presumed aspiration pneumonia -Add azithromycin for atypical organisms  Roxan Hockey, MD

## 2020-05-30 NOTE — Progress Notes (Signed)
Patient Demographics:    William Barrett, is a 60 y.o. male, DOB - May 15, 1960, YEB:[id number]  Admit date - 05/26/2020   Admitting Physician Truett Mainland, DO  Outpatient Primary MD for the patient is [name], MD  LOS - 4   Chief Complaint  Patient presents with  . Shortness of Breath        Subjective:    William Barrett today has  No chest pain,  --- No further emesis -Shortness of breath and hypoxia persist -Nausea and abdominal distention noted--- abdominal x-rays with transverse colonic dilatation No fever  Or chills   Assessment  & Plan :    Principal Problem:   Acute on chronic heart failure with preserved ejection fraction (HFpEF)/Acute on Chronic Diastolic HF Active Problems:   Acute respiratory failure with hypoxia (HCC)   Chronic alcohol abuse   Essential hypertension   Hyponatremia   Alcoholic Cirrhosis (HCC)  Brief Summary:- 60 y.o. male with a history of hypertension, chronic alcohol use, alcoholic cirrhosis, heart failure with preserved EF  admitted on 05/26/2020 with worsening shortness of breath and acute hypoxic respiratory failure after running out of diuretics.  A/p 1)HFpEF--acute on chronic diastolic dysfunction CHF--- PTA patient was noncompliant with diuretic -Patient admits to  high salt intake and drinks lots of beer PTA -Dyspnea and hypoxia persist--not worse -Continue IV Lasix given -Repeat chest x-ray on 05/30/2020 with CHF findings -Daily weight and fluid input and output monitoring -Chest x-ray and BNP consistent with CHF exacerbation -Echo with EF over 75%, hyperdynamic left ventricle noted severe LVH, no regional wall motion abnormalities -Fluid balance is negative and weight is trending down with diuresis  2) colonic ileus--abdominal x-rays with transverse colonic dilatation,  -Patient with nausea and abdominal distention, no further  emesis --Discussed with Dr. Arnoldo Morale -CT abdomen and pelvis ordered and pending  3) alcoholic liver disease--- low platelets and high MCV noted -Ammonia is 49, INR and LFTs are not elevated -Treated with lactulose   4) acute hypoxic respiratory failure----secondary to #1 above, should improve with diuresis  -Chest x-ray on 05/30/2020  5) hyponatremia--- multifactorial, partly due to beer potomania compounded by hemodilution from CHF and liver cirrhosis -Sodium is up to 132 from 123 continue gentle diuresis  6) fevers overnight--- chest x-ray consistent with CHF rather than pneumonia, -UA is not suggestive of UTI -Fevers have resolved  7) alcohol withdrawal--- DTs improved clinically  -Benzos as ordered -Folic acid and thiamine as ordered  Disposition/Need for in-Hospital Stay- patient unable to be discharged at this time due to --acute CHF exacerbation requiring IV diuresis, acute hypoxic respiratory failure requiring supplemental oxygen*  Status is: Inpatient  Remains inpatient appropriate because:Please see above   Disposition: The patient is from: Home              Anticipated d/c is to: Home              Anticipated d/c date is: 2 days              Patient currently is not medically stable to d/c. Barriers: Not Clinically Stable-   Code Status :  -  Code Status: Full Code   Family Communication:    (patient is alert, awake and coherent)  Discussed with patient's sister ([name])  who patient lives with  Consults  : curbSide consult with general surgeon  DVT Prophylaxis  :   - SCDs  enoxaparin (LOVENOX) injection 40 mg Start: 05/26/20 2015   Lab Results  Component Value Date   PLT 81 (L) 05/29/2020    Inpatient Medications  Scheduled Meds: . bisacodyl  10 mg Rectal Once  . dextromethorphan-guaiFENesin  1 tablet Oral BID  . enoxaparin (LOVENOX) injection  40 mg Subcutaneous Q24H  . folic acid  1 mg Oral Daily  . [START ON 05/31/2020] furosemide  40 mg  Intravenous Daily  . metoprolol tartrate  25 mg Oral BID  . multivitamin with minerals  1 tablet Oral Daily  . nicotine  14 mg Transdermal Daily  . polyethylene glycol  17 g Oral Daily  . potassium chloride  40 mEq Oral TID  . thiamine  100 mg Oral Daily   Or  . thiamine  100 mg Intravenous Daily   Continuous Infusions:  PRN Meds:.acetaminophen, iohexol, ipratropium-albuterol, ondansetron **OR** ondansetron (ZOFRAN) IV    Anti-infectives (From admission, onward)   None        Objective:   Vitals:   05/29/20 1341 05/29/20 2026 05/30/20 0500 05/30/20 0800  BP:  102/70 106/76 97/67  Pulse:  88 75   Resp: 18 18 20    Temp:  98.7 F (37.1 C) 98.2 F (36.8 C)   TempSrc:  Oral Oral   SpO2:  91% 100%   Weight:      Height:        Wt Readings from Last 3 Encounters:  05/28/20 110.6 kg  04/01/19 81.6 kg  01/20/15 77.1 kg     Intake/Output Summary (Last 24 hours) at 05/30/2020 1314 Last data filed at 05/30/2020 1142 Gross per 24 hour  Intake 1920 ml  Output 2253 ml  Net -333 ml     Physical Exam  Gen:- Awake Alert, dyspnea on exertion noted HEENT:- [place].AT, No sclera icterus Nose- [place] 6L/min Neck-Supple Neck +ve JVD,.  Lungs-diminished breath sounds, bibasilar rales  CV- S1, S2 normal, regular  Abd-  +ve B.Sounds, Abd Soft, abdominal distention Extremity/Skin:- 2 +  edema, pedal pulses present  Psych-affect is appropriate, oriented x3 Neuro-generalized weakness, no new focal deficits, mostly resolved tremors   Data Review:   Micro Results Recent Results (from the past 240 hour(s))  Resp Panel by RT-PCR (Flu A&B, Covid) Nasopharyngeal Swab     Status: None   Collection Time: 05/26/20  4:13 PM   Specimen: Nasopharyngeal Swab; Nasopharyngeal(NP) swabs in vial transport medium  Result Value Ref Range Status   SARS Coronavirus 2 by RT PCR NEGATIVE NEGATIVE Final    Comment: (NOTE) SARS-CoV-2 target nucleic acids are NOT DETECTED.  The SARS-CoV-2 RNA is  generally detectable in upper respiratory specimens during the acute phase of infection. The lowest concentration of SARS-CoV-2 viral copies this assay can detect is 138 copies/mL. A negative result does not preclude SARS-Cov-2 infection and should not be used as the sole basis for treatment or other patient management decisions. A negative result may occur with  improper specimen collection/handling, submission of specimen other than nasopharyngeal swab, presence of viral mutation(s) within the areas targeted by this assay, and inadequate number of viral copies(<138 copies/mL). A negative result must be combined with clinical observations, patient history, and epidemiological information. The expected result is Negative.  Fact Sheet for Patients:  EntrepreneurPulse.com.au  Fact Sheet for Healthcare Providers:  IncredibleEmployment.be  This test is no t yet approved or cleared by the [place] and  has been authorized for detection and/or diagnosis of SARS-CoV-2 by FDA under an Emergency Use Authorization (EUA). This EUA will remain  in effect (meaning this test can be used) for the duration of the COVID-19 declaration under Section 564(b)(1) of the Act, 21 U.S.C.section 360bbb-3(b)(1), unless the authorization is terminated  or revoked sooner.       Influenza A by PCR NEGATIVE NEGATIVE Final   Influenza B by PCR NEGATIVE NEGATIVE Final    Comment: (NOTE) The Xpert Xpress SARS-CoV-2/FLU/RSV plus assay is intended as an aid in the diagnosis of influenza from Nasopharyngeal swab specimens and should not be used as a sole basis for treatment. Nasal washings and aspirates are unacceptable for Xpert Xpress SARS-CoV-2/FLU/RSV testing.  Fact Sheet for Patients: EntrepreneurPulse.com.au  Fact Sheet for Healthcare Providers: IncredibleEmployment.be  This test is not yet approved or cleared by the [place] FDA and has been authorized for detection and/or diagnosis of SARS-CoV-2 by FDA under an Emergency Use Authorization (EUA). This EUA will remain in effect (meaning this test can be used) for the duration of the COVID-19 declaration under Section 564(b)(1) of the Act, 21 U.S.C. section 360bbb-3(b)(1), unless the authorization is terminated or revoked.  Performed at [facility], [address] [place], [place] [address]     Radiology Reports DG Chest [place] 1 View  Result Date: 05/28/2020 CLINICAL DATA:  Shortness of breath. EXAM: PORTABLE CHEST 1 VIEW COMPARISON:  May 26, 2020. FINDINGS: Stable cardiomegaly with mild central pulmonary vascular congestion. No pneumothorax or pleural effusion is noted. No consolidative process is noted. Bony thorax is unremarkable. IMPRESSION: Stable cardiomegaly with mild central pulmonary vascular congestion. Electronically Signed   By: Marijo Conception M.D.   On: 05/28/2020 08:20   DG Chest [place] 1 View  Result Date: 05/26/2020 CLINICAL DATA:  Short of breath.  Lower extremity edema EXAM: PORTABLE CHEST 1 VIEW COMPARISON:  November 11, 2011 FINDINGS: There is cardiomegaly with mild pulmonary venous hypertension. There is no appreciable edema or airspace opacity. No adenopathy. No bone lesions. IMPRESSION: Cardiomegaly with a degree of pulmonary vascular congestion. No edema or airspace opacity. Electronically Signed   By: Lowella Grip III M.D.   On: 05/26/2020 16:14   DG ABD ACUTE 2+V W 1V CHEST  Result Date: 05/30/2020 CLINICAL DATA:  Pain.  Recent congestive heart failure EXAM: DG ABDOMEN ACUTE WITH 1 VIEW CHEST COMPARISON:  Chest radiograph May 28, 2020. FINDINGS: AP chest: There is no edema or airspace opacity. There is cardiomegaly with mild pulmonary venous hypertension. No adenopathy. Supine and upright abdomen: There is dilatation of transverse colon. No appreciable small bowel dilatation. No appreciable air-fluid levels. No free air.  Probable small phlebolith left pelvis. IMPRESSION: Transverse colon dilatation. Question a degree of colonic ileus. Bowel obstruction not felt to be likely. No free air. Cardiomegaly with a degree of pulmonary vascular congestion noted. No edema or consolidation. Electronically Signed   By: Lowella Grip III M.D.   On: 05/30/2020 08:16   ECHOCARDIOGRAM COMPLETE  Result Date: 05/27/2020    ECHOCARDIOGRAM REPORT   Patient Name:   KAISEN ACKERS Date of Exam: 05/27/2020 Medical Rec #:  [id number]         Height:       68.0 in Accession #:    [id number]        Weight:       251.8 lb Date  of Birth:  01/22/1961         BSA:          2.254 m Patient Age:    82 years          BP:           126/87 mmHg Patient Gender: M                 HR:           84 bpm. Exam Location:  [facility] Procedure: 2D Echo Indications:    Congestive Heart Failure I50.9  History:        Patient has prior history of Echocardiogram examinations, most                 recent 12/30/2012. CHF; Risk Factors:Current Smoker and                 Hypertension. Chronic alcohol abuse, Acute respiratory failure                 with hypoxia.  Sonographer:    Leavy Cella RDCS (AE) Referring Phys: [place]  1. Left ventricular ejection fraction, by estimation, is >75%. The left ventricle has hyperdynamic function. The left ventricle has no regional wall motion abnormalities. There is severe left ventricular hypertrophy. Left ventricular diastolic parameters were normal.  2. Right ventricular systolic function is mildly reduced. The right ventricular size is mildly enlarged.  3. Right atrial size was mildly dilated.  4. The aortic valve is tricuspid. Aortic valve regurgitation is not visualized. Mild aortic valve sclerosis is present, with no evidence of aortic valve stenosis.  5. The inferior vena cava is normal in size with greater than 50% respiratory variability, suggesting right atrial pressure of 3 mmHg. FINDINGS  Left  Ventricle: Left ventricular ejection fraction, by estimation, is >75%. The left ventricle has hyperdynamic function. The left ventricle has no regional wall motion abnormalities. The left ventricular internal cavity size was normal in size. There is severe left ventricular hypertrophy. Left ventricular diastolic parameters were normal. Right Ventricle: The right ventricular size is mildly enlarged. Right vetricular wall thickness was not assessed. Right ventricular systolic function is mildly reduced. Left Atrium: Left atrial size was normal in size. Right Atrium: Right atrial size was mildly dilated. Pericardium: There is no evidence of pericardial effusion. Mitral Valve: The mitral valve is abnormal. There is mild thickening of the mitral valve leaflet(s). Mild mitral annular calcification. Trivial mitral valve regurgitation. Tricuspid Valve: The tricuspid valve is normal in structure. Tricuspid valve regurgitation is mild. Aortic Valve: The aortic valve is tricuspid. Aortic valve regurgitation is not visualized. Mild aortic valve sclerosis is present, with no evidence of aortic valve stenosis. Aortic valve mean gradient measures 11.7 mmHg. Aortic valve peak gradient measures 16.8 mmHg. Aortic valve area, by VTI measures 3.11 cm. Pulmonic Valve: The pulmonic valve was normal in structure. Pulmonic valve regurgitation is not visualized. Aorta: The aortic root and ascending aorta are structurally normal, with no evidence of dilitation. Venous: The inferior vena cava is normal in size with greater than 50% respiratory variability, suggesting right atrial pressure of 3 mmHg.  LEFT VENTRICLE PLAX 2D LVIDd:         4.58 cm  Diastology LVIDs:         2.22 cm  LV e' medial:    8.38 cm/s LV PW:         1.84 cm  LV E/e' medial:  13.7  LV IVS:        1.47 cm  LV e' lateral:   10.20 cm/s LVOT diam:     2.10 cm  LV E/e' lateral: 11.3 LV SV:         134 LV SV Index:   60 LVOT Area:     3.46 cm  RIGHT VENTRICLE RV S prime:      14.10 cm/s TAPSE (M-mode): 2.8 cm LEFT ATRIUM             Index       RIGHT ATRIUM           Index LA diam:        4.90 cm 2.17 cm/m  RA Area:     20.00 cm LA Vol (A2C):   75.6 ml 33.54 ml/m RA Volume:   59.40 ml  26.36 ml/m LA Vol (A4C):   48.8 ml 21.65 ml/m LA Biplane Vol: 63.3 ml 28.09 ml/m  AORTIC VALVE AV Area (Vmax):    2.97 cm AV Area (Vmean):   2.92 cm AV Area (VTI):     3.11 cm AV Vmax:           205.00 cm/s AV Vmean:          164.333 cm/s AV VTI:            0.431 m AV Peak Grad:      16.8 mmHg AV Mean Grad:      11.7 mmHg LVOT Vmax:         176.00 cm/s LVOT Vmean:        138.667 cm/s LVOT VTI:          0.387 m LVOT/AV VTI ratio: 0.90  AORTA Ao Root diam: 3.40 cm MITRAL VALVE                TRICUSPID VALVE MV Area (PHT): 2.99 cm     TR Peak grad:   29.4 mmHg MV Decel Time: 254 msec     TR Vmax:        271.00 cm/s MR Peak grad: 62.1 mmHg MR Vmax:      394.00 cm/s   SHUNTS MV E velocity: 115.00 cm/s  Systemic VTI:  0.39 m MV A velocity: 86.70 cm/s   Systemic Diam: 2.10 cm MV E/A ratio:  1.33 Dorris Carnes MD Electronically signed by Dorris Carnes MD Signature Date/Time: 05/27/2020/6:36:32 PM    Final      CBC Recent Labs  Lab 05/26/20 1544 05/29/20 0545  WBC 6.0 6.3  HGB 15.1 14.8  HCT 46.0 46.3  PLT 108* 81*  MCV 113.9* 115.8*  MCH 37.4* 37.0*  MCHC 32.8 32.0  RDW 15.4 14.9  LYMPHSABS 1.1  --   MONOABS 0.7  --   EOSABS 0.6*  --   BASOSABS 0.1  --     Chemistries  Recent Labs  Lab 05/26/20 1544 05/26/20 2100 05/27/20 0559 05/28/20 0535 05/29/20 0545 05/30/20 0540  NA 123* 125* 128* 130*  131* 132*  132* 131*  K 4.4 4.3 4.3 4.1  4.2 3.2*  3.2* 3.4*  CL 86* 85* 85* 82*  82* 80*  81* 82*  CO2 32 31 34* 37*  38* 40*  39* 38*  GLUCOSE 140* 114* 117* 101*  101* 92  93 101*  BUN 10 10 10 11  10 12  12 13   CREATININE 0.78 0.84 0.72 0.75  0.73 0.65  0.66 0.72  CALCIUM 8.1* 7.9* 8.2* 8.1*  8.1* 8.1*  8.0* 8.0*  MG  --   --   --  1.7  --   --   AST 28  --   --    --   --   --   ALT 30  --   --   --   --   --   ALKPHOS 84  --   --   --   --   --   BILITOT 0.5  --   --   --   --   --    ------------------------------------------------------------------------------------------------------------------ No results for input(s): CHOL, HDL, LDLCALC, TRIG, CHOLHDL, LDLDIRECT in the last 72 hours.  No results found for: HGBA1C ------------------------------------------------------------------------------------------------------------------ No results for input(s): TSH, T4TOTAL, T3FREE, THYROIDAB in the last 72 hours.  Invalid input(s): FREET3 ------------------------------------------------------------------------------------------------------------------ No results for input(s): VITAMINB12, FOLATE, FERRITIN, TIBC, IRON, RETICCTPCT in the last 72 hours.  Coagulation profile Recent Labs  Lab 05/26/20 1544  INR 1.1    No results for input(s): DDIMER in the last 72 hours.  Cardiac Enzymes No results for input(s): CKMB, TROPONINI, MYOGLOBIN in the last 168 hours.  Invalid input(s): CK ------------------------------------------------------------------------------------------------------------------    Component Value Date/Time   BNP 733.0 (H) 05/26/2020 1544   Roxan Hockey M.D on 05/30/2020 at 1:14 PM  Go to [email or web address] - for contact info  [facility] - Office  [phone]

## 2020-05-31 ENCOUNTER — Inpatient Hospital Stay (HOSPITAL_COMMUNITY): Payer: Medicare HMO

## 2020-05-31 DIAGNOSIS — I5033 Acute on chronic diastolic (congestive) heart failure: Secondary | ICD-10-CM | POA: Diagnosis not present

## 2020-05-31 LAB — CBC
HCT: 48.5 % (ref 39.0–52.0)
Hemoglobin: 15.2 g/dL (ref 13.0–17.0)
MCH: 36.5 pg — ABNORMAL HIGH (ref 26.0–34.0)
MCHC: 31.3 g/dL (ref 30.0–36.0)
MCV: 116.3 fL — ABNORMAL HIGH (ref 80.0–100.0)
Platelets: 91 10*3/uL — ABNORMAL LOW (ref 150–400)
RBC: 4.17 MIL/uL — ABNORMAL LOW (ref 4.22–5.81)
RDW: 14.4 % (ref 11.5–15.5)
WBC: 5.3 10*3/uL (ref 4.0–10.5)
nRBC: 0 % (ref 0.0–0.2)

## 2020-05-31 LAB — BASIC METABOLIC PANEL
Anion gap: 7 (ref 5–15)
BUN: 12 mg/dL (ref 6–20)
CO2: 40 mmol/L — ABNORMAL HIGH (ref 22–32)
Calcium: 8.2 mg/dL — ABNORMAL LOW (ref 8.9–10.3)
Chloride: 87 mmol/L — ABNORMAL LOW (ref 98–111)
Creatinine, Ser: 0.63 mg/dL (ref 0.61–1.24)
GFR, Estimated: >60 mL/min (ref >60)
Glucose, Bld: 99 mg/dL (ref 70–99)
Potassium: 4.3 mmol/L (ref 3.5–5.1)
Sodium: 134 mmol/L — ABNORMAL LOW (ref 135–145)

## 2020-05-31 LAB — GLUCOSE, CAPILLARY: Glucose-Capillary: 109 mg/dL — ABNORMAL HIGH (ref 70–99)

## 2020-05-31 LAB — MAGNESIUM: Magnesium: 2 mg/dL (ref 1.7–2.4)

## 2020-05-31 LAB — C DIFFICILE (CDIFF) QUICK SCRN (NO PCR REFLEX)
C Diff antigen: NEGATIVE
C Diff interpretation: NOT DETECTED
C Diff toxin: NEGATIVE

## 2020-05-31 MED ORDER — SODIUM CHLORIDE 0.9 % IV SOLN: 1.0000 g | INTRAVENOUS | Status: DC

## 2020-05-31 MED ORDER — ENOXAPARIN SODIUM 60 MG/0.6ML ~~LOC~~ SOLN
55.0000 mg | SUBCUTANEOUS | Status: DC
  Administered 2020-05-31 – 2020-06-02 (×3): 55 mg via SUBCUTANEOUS
  Filled 2020-05-31 (×3): qty 0.6

## 2020-05-31 MED ORDER — SODIUM CHLORIDE 0.9 % IV SOLN
2.0000 g | INTRAVENOUS | Status: DC
  Administered 2020-05-31 – 2020-06-05 (×6): 2 g via INTRAVENOUS
  Filled 2020-05-31 (×6): qty 20

## 2020-05-31 MED ORDER — RISAQUAD PO CAPS
1.0000 | ORAL_CAPSULE | Freq: Three times a day (TID) | ORAL | Status: DC
  Administered 2020-05-31 – 2020-06-06 (×17): 1 via ORAL
  Filled 2020-05-31 (×17): qty 1

## 2020-05-31 NOTE — Progress Notes (Signed)
Patient Demographics:    William Barrett, is a 60 y.o. male, DOB - 1961/02/21, RPR:[id number]  Admit date - 05/26/2020   Admitting Physician Truett Mainland, DO  Outpatient Primary MD for the patient is [name], MD  LOS - 5   Chief Complaint  Patient presents with  . Shortness of Breath        Subjective:    William Barrett today has  No chest pain,  --- -Diarrhea persists - abdominal discomfort persist -Cough and shortness of breath persist -Hypoxia persist  Assessment  & Plan :    Principal Problem:   Acute on chronic heart failure with preserved ejection fraction (HFpEF)/Acute on Chronic Diastolic HF Active Problems:   Acute respiratory failure with hypoxia (HCC)   Chronic alcohol abuse   Essential hypertension   Hyponatremia   Alcoholic Cirrhosis ([place])  Brief Summary:- 60 y.o. male with a history of hypertension, chronic alcohol use, alcoholic cirrhosis, heart failure with preserved EF  admitted on 05/26/2020 with worsening shortness of breath and acute hypoxic respiratory failure after running out of diuretics. -Now with diarrhea and CT findings of colitis, C. difficile testing pending  A/p 1)HFpEF--acute on chronic diastolic dysfunction CHF--- PTA patient was noncompliant with diuretic -Patient admits to  high salt intake and drinks lots of beer PTA -Dyspnea and hypoxia persist--not worse -Continue IV Lasix given -Repeat chest x-ray on 05/30/2020 with CHF findings -Daily weight and fluid input and output monitoring -Chest x-ray and BNP consistent with CHF exacerbation -Echo with EF over 75%, hyperdynamic left ventricle noted severe LVH, no regional wall motion abnormalities -Fluid balance is negative and weight is trending down with diuresis  2)Acute colitis with colonic ileus--abdominal x-rays with transverse colonic dilatation,  -CT abdomen and pelvis with colitis  extending from the hepatic flexure to the mid transverse colon -Per ID physician okay to stop Unasyn which was initially chosen to cover both colitis and presumed aspiration pneumonia -ID physician recommends Rocephin and azithromycin for very short duration -Patient with diarrhea prior to any antibiotic therapy -Discussed with on-call ID physician Dr. Delfina Redwood--- okay to check C. difficile and GI pathogen persistent diarrhea and finding on CT of colitis  3)Alcoholic liver disease/liver cirrhosis --- low platelets and high MCV noted -Ammonia is 49, INR and LFTs are not elevated  4)Rt Sided  aspiration pneumonia--alcoholic male -CT abdomen and pelvis from 05/30/2020 consistent with aspiration pneumonia Per ID physician okay to stop Unasyn which was initially chosen to cover both colitis and presumed aspiration pneumonia -ID physician recommends Rocephin and azithromycin for very short duration -Patient with diarrhea prior to any antibiotic therapy --No further fevers  5) hyponatremia--- multifactorial, partly due to beer potomania compounded by hemodilution from CHF and liver cirrhosis -Sodium is up to 134 from 123  --continue gentle diuresis  6)Acute hypoxic respiratory failure----secondary to CHF and aspiration pneumonia above, should improve with diuresis and antibiotic therapy -Chest x-ray on 06/01/2020  7) alcohol withdrawal--- DTs improved clinically  -Benzos as ordered -Folic acid and thiamine as ordered  Disposition/Need for in-Hospital Stay- patient unable to be discharged at this time due to --acute CHF exacerbation and aspiration pneumonia requiring IV diuresis and IV antibiotics, acute hypoxic respiratory failure requiring supplemental oxygen*  Status is:  Inpatient  Remains inpatient appropriate because:Please see above   Disposition: The patient is from: Home              Anticipated d/c is to: Home              Anticipated d/c date is: 2 days              Patient  currently is not medically stable to d/c. Barriers: Not Clinically Stable-   Code Status :  -  Code Status: Full Code   Family Communication:    (patient is alert, awake and coherent)  Discussed with patient's sister ([name])  who patient lives with  Consults  : curbSide consult with general surgeon  DVT Prophylaxis  :   - SCDs     Lab Results  Component Value Date   PLT 91 (L) 05/31/2020    Inpatient Medications  Scheduled Meds: . bisacodyl  10 mg Rectal Once  . dextromethorphan-guaiFENesin  1 tablet Oral BID  . enoxaparin (LOVENOX) injection  55 mg Subcutaneous Q24H  . folic acid  1 mg Oral Daily  . furosemide  40 mg Intravenous Daily  . metoprolol tartrate  25 mg Oral BID  . multivitamin with minerals  1 tablet Oral Daily  . nicotine  14 mg Transdermal Daily  . polyethylene glycol  17 g Oral Daily  . potassium chloride  40 mEq Oral TID  . thiamine  100 mg Oral Daily   Or  . thiamine  100 mg Intravenous Daily   Continuous Infusions: . azithromycin 500 mg (05/31/20 1546)  . cefTRIAXone (ROCEPHIN)  IV 2 g (05/31/20 1206)   PRN Meds:.acetaminophen, ipratropium-albuterol, ondansetron **OR** ondansetron (ZOFRAN) IV    Anti-infectives (From admission, onward)   Start     Dose/Rate Route Frequency Ordered Stop   05/31/20 1202  cefTRIAXone (ROCEPHIN) 2 g in sodium chloride 0.9 % 100 mL IVPB        2 g 200 mL/hr over 30 Minutes Intravenous Every 24 hours 05/31/20 1202     05/31/20 1200  cefTRIAXone (ROCEPHIN) 1 g in sodium chloride 0.9 % 100 mL IVPB  Status:  Discontinued        1 g 200 mL/hr over 30 Minutes Intravenous Every 24 hours 05/31/20 1154 05/31/20 1202   05/30/20 1730  Ampicillin-Sulbactam (UNASYN) 3 g in sodium chloride 0.9 % 100 mL IVPB  Status:  Discontinued        3 g 200 mL/hr over 30 Minutes Intravenous Every 6 hours 05/30/20 1624 05/31/20 1154   05/30/20 1700  cefTRIAXone (ROCEPHIN) 1 g in sodium chloride 0.9 % 100 mL IVPB  Status:  Discontinued         1 g 200 mL/hr over 30 Minutes Intravenous Every 24 hours 05/30/20 1616 05/30/20 1618   05/30/20 1700  azithromycin (ZITHROMAX) 500 mg in sodium chloride 0.9 % 250 mL IVPB        500 mg 250 mL/hr over 60 Minutes Intravenous Every 24 hours 05/30/20 1616     05/30/20 1700  metroNIDAZOLE (FLAGYL) IVPB 500 mg  Status:  Discontinued        500 mg 100 mL/hr over 60 Minutes Intravenous Every 8 hours 05/30/20 1616 05/30/20 1618        Objective:   Vitals:   05/30/20 2140 05/30/20 2146 05/31/20 0843 05/31/20 1336  BP: 132/76 132/76 117/68 101/67  Pulse: 90 89 82 79  Resp:  18  17  Temp:  97.9 F (36.6 C)  98.2 F (36.8 C)  TempSrc:  Oral  Oral  SpO2:  96%  94%  Weight:      Height:        Wt Readings from Last 3 Encounters:  05/28/20 110.6 kg  04/01/19 81.6 kg  01/20/15 77.1 kg     Intake/Output Summary (Last 24 hours) at 05/31/2020 1852 Last data filed at 05/31/2020 1500 Gross per 24 hour  Intake 598.71 ml  Output 2803 ml  Net -2204.29 ml     Physical Exam  Gen:- Awake Alert, dyspnea on exertion noted HEENT:- [place].AT, No sclera icterus Nose- [place] 6L/min Neck-Supple Neck +ve JVD,.  Lungs-diminished breath sounds, bibasilar rales  CV- S1, S2 normal, regular  Abd-  +ve B.Sounds, Abd Soft, abdominal distention, upper abdominal periumbilical area tenderness without rebound or guarding Extremity/Skin:- 2 +  edema, pedal pulses present  Psych-affect is appropriate, oriented x3 Neuro-generalized weakness, no new focal deficits, mostly resolved tremors   Data Review:   Micro Results Recent Results (from the past 240 hour(s))  Resp Panel by RT-PCR (Flu A&B, Covid) Nasopharyngeal Swab     Status: None   Collection Time: 05/26/20  4:13 PM   Specimen: Nasopharyngeal Swab; Nasopharyngeal(NP) swabs in vial transport medium  Result Value Ref Range Status   SARS Coronavirus 2 by RT PCR NEGATIVE NEGATIVE Final    Comment: (NOTE) SARS-CoV-2 target nucleic acids are NOT  DETECTED.  The SARS-CoV-2 RNA is generally detectable in upper respiratory specimens during the acute phase of infection. The lowest concentration of SARS-CoV-2 viral copies this assay can detect is 138 copies/mL. A negative result does not preclude SARS-Cov-2 infection and should not be used as the sole basis for treatment or other patient management decisions. A negative result may occur with  improper specimen collection/handling, submission of specimen other than nasopharyngeal swab, presence of viral mutation(s) within the areas targeted by this assay, and inadequate number of viral copies(<138 copies/mL). A negative result must be combined with clinical observations, patient history, and epidemiological information. The expected result is Negative.  Fact Sheet for Patients:  EntrepreneurPulse.com.au  Fact Sheet for Healthcare Providers:  IncredibleEmployment.be  This test is no t yet approved or cleared by the [place] FDA and  has been authorized for detection and/or diagnosis of SARS-CoV-2 by FDA under an Emergency Use Authorization (EUA). This EUA will remain  in effect (meaning this test can be used) for the duration of the COVID-19 declaration under Section 564(b)(1) of the Act, 21 U.S.C.section 360bbb-3(b)(1), unless the authorization is terminated  or revoked sooner.       Influenza A by PCR NEGATIVE NEGATIVE Final   Influenza B by PCR NEGATIVE NEGATIVE Final    Comment: (NOTE) The Xpert Xpress SARS-CoV-2/FLU/RSV plus assay is intended as an aid in the diagnosis of influenza from Nasopharyngeal swab specimens and should not be used as a sole basis for treatment. Nasal washings and aspirates are unacceptable for Xpert Xpress SARS-CoV-2/FLU/RSV testing.  Fact Sheet for Patients: EntrepreneurPulse.com.au  Fact Sheet for Healthcare Providers: IncredibleEmployment.be  This test is not yet  approved or cleared by the [place] FDA and has been authorized for detection and/or diagnosis of SARS-CoV-2 by FDA under an Emergency Use Authorization (EUA). This EUA will remain in effect (meaning this test can be used) for the duration of the COVID-19 declaration under Section 564(b)(1) of the Act, 21 U.S.C. section 360bbb-3(b)(1), unless the authorization is terminated or revoked.  Performed at [facility],  [address] [place], [place] [address]     Radiology Reports CT ABDOMEN PELVIS W CONTRAST  Result Date: 05/30/2020 CLINICAL DATA:  Bowel obstruction, constipation, bloating, generalized abdominal pain EXAM: CT ABDOMEN AND PELVIS WITH CONTRAST TECHNIQUE: Multidetector CT imaging of the abdomen and pelvis was performed using the standard protocol following bolus administration of intravenous contrast. Sagittal and coronal MPR images reconstructed from axial data set. CONTRAST:  182mL OMNIPAQUE IOHEXOL 300 MG/ML SOLN IV. Water-soluble rectal contrast administered. COMPARISON:  06/17/2012 FINDINGS: Lower chest: Patchy infiltrates RIGHT lower lobe. Minimal subsegmental atelectasis LEFT lower lobe. Hepatobiliary: Irregular nodular hepatic contours consistent with cirrhosis. Gallbladder contracted. No discrete hepatic mass. Enlargement of lateral segment LEFT lobe. Pancreas: Normal appearance Spleen: Vague area of hypoechogenicity 7 mm diameter, nonspecific Adrenals/Urinary Tract: Slightly malrotated kidneys with hilum directed anteriorly. Adrenal glands, kidneys, ureters, and bladder otherwise normal appearance Stomach/Bowel: Normal appendix. Segmental wall thickening of the colon from hepatic flexure to mid transverse colon consistent with colitis. Remainder of colon normal appearance. No colonic mass or stricture. Stomach and small bowel loops unremarkable. Vascular/Lymphatic: Atherosclerotic calcifications aorta and iliac arteries without aneurysm. Vascular structures patent. No  adenopathy. Collateral identified from LEFT portal vein to anterior abdominal wall and into the LEFT hypogastric vein. Reproductive: Unremarkable prostate gland and seminal vesicles Other: Scattered subcutaneous edema. No free air or free fluid. Small LEFT inguinal hernia containing fat. Musculoskeletal: Advanced degenerative disc disease changes at L3-L4 with BILATERAL spondylolysis of L3 and grade 1 anterolisthesis. IMPRESSION: Cirrhotic liver with collateral vessel from LEFT portal vein to LEFT hypogastric vein. Segmental wall thickening of the colon from hepatic flexure to mid transverse colon consistent with colitis; differential diagnosis includes infection and inflammatory bowel disease, ischemia considered less likely due to lack of significant vascular disease changes,. Patchy infiltrates RIGHT lower lobe question pneumonia. Small LEFT inguinal hernia containing fat. BILATERAL spondylolysis L3 with grade 1 anterolisthesis at L3-L4. Aortic Atherosclerosis (ICD10-I70.0). Electronically Signed   By: Lavonia Dana M.D.   On: 05/30/2020 16:02   DG Chest Port 1 View  Result Date: 05/28/2020 CLINICAL DATA:  Shortness of breath. EXAM: PORTABLE CHEST 1 VIEW COMPARISON:  May 26, 2020. FINDINGS: Stable cardiomegaly with mild central pulmonary vascular congestion. No pneumothorax or pleural effusion is noted. No consolidative process is noted. Bony thorax is unremarkable. IMPRESSION: Stable cardiomegaly with mild central pulmonary vascular congestion. Electronically Signed   By: Marijo Conception M.D.   On: 05/28/2020 08:20   DG Chest Port 1 View  Result Date: 05/26/2020 CLINICAL DATA:  Short of breath.  Lower extremity edema EXAM: PORTABLE CHEST 1 VIEW COMPARISON:  November 11, 2011 FINDINGS: There is cardiomegaly with mild pulmonary venous hypertension. There is no appreciable edema or airspace opacity. No adenopathy. No bone lesions. IMPRESSION: Cardiomegaly with a degree of pulmonary vascular congestion. No  edema or airspace opacity. Electronically Signed   By: Lowella Grip III M.D.   On: 05/26/2020 16:14   DG ABD ACUTE 2+V W 1V CHEST  Result Date: 05/31/2020 CLINICAL DATA:  Abdominal pain and distention. EXAM: DG ABDOMEN ACUTE WITH 1 VIEW CHEST COMPARISON:  May 30, 2020. FINDINGS: There is no evidence of free intraperitoneal air. Stable air-filled transverse colon is noted. No significant small bowel dilatation is noted. No radiopaque calculi or other significant radiographic abnormality is seen. Stable cardiomegaly. Both lungs are clear. IMPRESSION: Stable air-filled transverse colon is noted which may represent ileus. Electronically Signed   By: Marijo Conception M.D.   On: 05/31/2020 08:36  DG ABD ACUTE 2+V W 1V CHEST  Result Date: 05/30/2020 CLINICAL DATA:  Pain.  Recent congestive heart failure EXAM: DG ABDOMEN ACUTE WITH 1 VIEW CHEST COMPARISON:  Chest radiograph May 28, 2020. FINDINGS: AP chest: There is no edema or airspace opacity. There is cardiomegaly with mild pulmonary venous hypertension. No adenopathy. Supine and upright abdomen: There is dilatation of transverse colon. No appreciable small bowel dilatation. No appreciable air-fluid levels. No free air. Probable small phlebolith left pelvis. IMPRESSION: Transverse colon dilatation. Question a degree of colonic ileus. Bowel obstruction not felt to be likely. No free air. Cardiomegaly with a degree of pulmonary vascular congestion noted. No edema or consolidation. Electronically Signed   By: Lowella Grip III M.D.   On: 05/30/2020 08:16   ECHOCARDIOGRAM COMPLETE  Result Date: 05/27/2020    ECHOCARDIOGRAM REPORT   Patient Name:   KENICHI CASSADA Date of Exam: 05/27/2020 Medical Rec #:  [id number]         Height:       68.0 in Accession #:    [id number]        Weight:       251.8 lb Date of Birth:  [birth year]         BSA:          2.254 m Patient Age:    34 years          BP:           126/87 mmHg Patient Gender: M                  HR:           84 bpm. Exam Location:  [facility] Procedure: 2D Echo Indications:    Congestive Heart Failure I50.9  History:        Patient has prior history of Echocardiogram examinations, most                 recent 12/30/2012. CHF; Risk Factors:Current Smoker and                 Hypertension. Chronic alcohol abuse, Acute respiratory failure                 with hypoxia.  Sonographer:    Leavy Cella RDCS (AE) Referring Phys: [place]  1. Left ventricular ejection fraction, by estimation, is >75%. The left ventricle has hyperdynamic function. The left ventricle has no regional wall motion abnormalities. There is severe left ventricular hypertrophy. Left ventricular diastolic parameters were normal.  2. Right ventricular systolic function is mildly reduced. The right ventricular size is mildly enlarged.  3. Right atrial size was mildly dilated.  4. The aortic valve is tricuspid. Aortic valve regurgitation is not visualized. Mild aortic valve sclerosis is present, with no evidence of aortic valve stenosis.  5. The inferior vena cava is normal in size with greater than 50% respiratory variability, suggesting right atrial pressure of 3 mmHg. FINDINGS  Left Ventricle: Left ventricular ejection fraction, by estimation, is >75%. The left ventricle has hyperdynamic function. The left ventricle has no regional wall motion abnormalities. The left ventricular internal cavity size was normal in size. There is severe left ventricular hypertrophy. Left ventricular diastolic parameters were normal. Right Ventricle: The right ventricular size is mildly enlarged. Right vetricular wall thickness was not assessed. Right ventricular systolic function is mildly reduced. Left Atrium: Left atrial size was normal in size. Right Atrium: Right atrial size was mildly dilated. Pericardium:  There is no evidence of pericardial effusion. Mitral Valve: The mitral valve is abnormal. There is mild thickening of the  mitral valve leaflet(s). Mild mitral annular calcification. Trivial mitral valve regurgitation. Tricuspid Valve: The tricuspid valve is normal in structure. Tricuspid valve regurgitation is mild. Aortic Valve: The aortic valve is tricuspid. Aortic valve regurgitation is not visualized. Mild aortic valve sclerosis is present, with no evidence of aortic valve stenosis. Aortic valve mean gradient measures 11.7 mmHg. Aortic valve peak gradient measures 16.8 mmHg. Aortic valve area, by VTI measures 3.11 cm. Pulmonic Valve: The pulmonic valve was normal in structure. Pulmonic valve regurgitation is not visualized. Aorta: The aortic root and ascending aorta are structurally normal, with no evidence of dilitation. Venous: The inferior vena cava is normal in size with greater than 50% respiratory variability, suggesting right atrial pressure of 3 mmHg.  LEFT VENTRICLE PLAX 2D LVIDd:         4.58 cm  Diastology LVIDs:         2.22 cm  LV e' medial:    8.38 cm/s LV PW:         1.84 cm  LV E/e' medial:  13.7 LV IVS:        1.47 cm  LV e' lateral:   10.20 cm/s LVOT diam:     2.10 cm  LV E/e' lateral: 11.3 LV SV:         134 LV SV Index:   60 LVOT Area:     3.46 cm  RIGHT VENTRICLE RV S prime:     14.10 cm/s TAPSE (M-mode): 2.8 cm LEFT ATRIUM             Index       RIGHT ATRIUM           Index LA diam:        4.90 cm 2.17 cm/m  RA Area:     20.00 cm LA Vol (A2C):   75.6 ml 33.54 ml/m RA Volume:   59.40 ml  26.36 ml/m LA Vol (A4C):   48.8 ml 21.65 ml/m LA Biplane Vol: 63.3 ml 28.09 ml/m  AORTIC VALVE AV Area (Vmax):    2.97 cm AV Area (Vmean):   2.92 cm AV Area (VTI):     3.11 cm AV Vmax:           205.00 cm/s AV Vmean:          164.333 cm/s AV VTI:            0.431 m AV Peak Grad:      16.8 mmHg AV Mean Grad:      11.7 mmHg LVOT Vmax:         176.00 cm/s LVOT Vmean:        138.667 cm/s LVOT VTI:          0.387 m LVOT/AV VTI ratio: 0.90  AORTA Ao Root diam: 3.40 cm MITRAL VALVE                TRICUSPID VALVE MV Area  (PHT): 2.99 cm     TR Peak grad:   29.4 mmHg MV Decel Time: 254 msec     TR Vmax:        271.00 cm/s MR Peak grad: 62.1 mmHg MR Vmax:      394.00 cm/s   SHUNTS MV E velocity: 115.00 cm/s  Systemic VTI:  0.39 m MV A velocity: 86.70 cm/s   Systemic Diam: 2.10 cm MV E/A ratio:  1.33 Dorris Carnes MD Electronically signed by Dorris Carnes MD Signature Date/Time: 05/27/2020/6:36:32 PM    Final      CBC Recent Labs  Lab 05/26/20 1544 05/29/20 0545 05/31/20 0606  WBC 6.0 6.3 5.3  HGB 15.1 14.8 15.2  HCT 46.0 46.3 48.5  PLT 108* 81* 91*  MCV 113.9* 115.8* 116.3*  MCH 37.4* 37.0* 36.5*  MCHC 32.8 32.0 31.3  RDW 15.4 14.9 14.4  LYMPHSABS 1.1  --   --   MONOABS 0.7  --   --   EOSABS 0.6*  --   --   BASOSABS 0.1  --   --     Chemistries  Recent Labs  Lab 05/26/20 1544 05/26/20 2100 05/27/20 0559 05/28/20 0535 05/29/20 0545 05/30/20 0540 05/31/20 0606  NA 123*   < > 128* 130*  131* 132*  132* 131* 134*  K 4.4   < > 4.3 4.1  4.2 3.2*  3.2* 3.4* 4.3  CL 86*   < > 85* 82*  82* 80*  81* 82* 87*  CO2 32   < > 34* 37*  38* 40*  39* 38* 40*  GLUCOSE 140*   < > 117* 101*  101* 92  93 101* 99  BUN 10   < > 10 11  10 12  12 13 12   CREATININE 0.78   < > 0.72 0.75  0.73 0.65  0.66 0.72 0.63  CALCIUM 8.1*   < > 8.2* 8.1*  8.1* 8.1*  8.0* 8.0* 8.2*  MG  --   --   --  1.7  --   --  2.0  AST 28  --   --   --   --   --   --   ALT 30  --   --   --   --   --   --   ALKPHOS 84  --   --   --   --   --   --   BILITOT 0.5  --   --   --   --   --   --    < > = values in this interval not displayed.   ------------------------------------------------------------------------------------------------------------------ No results for input(s): CHOL, HDL, LDLCALC, TRIG, CHOLHDL, LDLDIRECT in the last 72 hours.  No results found for: HGBA1C ------------------------------------------------------------------------------------------------------------------ No results for input(s): TSH, T4TOTAL,  T3FREE, THYROIDAB in the last 72 hours.  Invalid input(s): FREET3 ------------------------------------------------------------------------------------------------------------------ No results for input(s): VITAMINB12, FOLATE, FERRITIN, TIBC, IRON, RETICCTPCT in the last 72 hours.  Coagulation profile Recent Labs  Lab 05/26/20 1544  INR 1.1    No results for input(s): DDIMER in the last 72 hours.  Cardiac Enzymes No results for input(s): CKMB, TROPONINI, MYOGLOBIN in the last 168 hours.  Invalid input(s): CK ------------------------------------------------------------------------------------------------------------------    Component Value Date/Time   BNP 733.0 (H) 05/26/2020 1544   Roxan Hockey M.D on 05/31/2020 at 6:52 PM  Go to [email or web address] - for contact info  [facility] - Office  [phone]

## 2020-06-01 ENCOUNTER — Inpatient Hospital Stay (HOSPITAL_COMMUNITY): Payer: Medicare HMO

## 2020-06-01 DIAGNOSIS — I5033 Acute on chronic diastolic (congestive) heart failure: Secondary | ICD-10-CM | POA: Diagnosis not present

## 2020-06-01 LAB — GLUCOSE, CAPILLARY: Glucose-Capillary: 98 mg/dL (ref 70–99)

## 2020-06-01 LAB — COMPREHENSIVE METABOLIC PANEL
ALT: 28 U/L (ref 0–44)
AST: 30 U/L (ref 15–41)
Albumin: 2.9 g/dL — ABNORMAL LOW (ref 3.5–5.0)
Alkaline Phosphatase: 58 U/L (ref 38–126)
Anion gap: 6 (ref 5–15)
BUN: 12 mg/dL (ref 6–20)
CO2: 37 mmol/L — ABNORMAL HIGH (ref 22–32)
Calcium: 8.1 mg/dL — ABNORMAL LOW (ref 8.9–10.3)
Chloride: 87 mmol/L — ABNORMAL LOW (ref 98–111)
Creatinine, Ser: 0.6 mg/dL — ABNORMAL LOW (ref 0.61–1.24)
GFR, Estimated: >60 mL/min (ref >60)
Glucose, Bld: 94 mg/dL (ref 70–99)
Potassium: 4.5 mmol/L (ref 3.5–5.1)
Sodium: 130 mmol/L — ABNORMAL LOW (ref 135–145)
Total Bilirubin: 1.2 mg/dL (ref 0.3–1.2)
Total Protein: 7.1 g/dL (ref 6.5–8.1)

## 2020-06-01 LAB — GASTROINTESTINAL PANEL BY PCR, STOOL (REPLACES STOOL CULTURE)

## 2020-06-01 LAB — CBC
HCT: 46.2 % (ref 39.0–52.0)
Hemoglobin: 14.6 g/dL (ref 13.0–17.0)
MCH: 37.2 pg — ABNORMAL HIGH (ref 26.0–34.0)
MCHC: 31.6 g/dL (ref 30.0–36.0)
MCV: 117.9 fL — ABNORMAL HIGH (ref 80.0–100.0)
Platelets: 81 10*3/uL — ABNORMAL LOW (ref 150–400)
RBC: 3.92 MIL/uL — ABNORMAL LOW (ref 4.22–5.81)
RDW: 14.1 % (ref 11.5–15.5)
WBC: 5.5 10*3/uL (ref 4.0–10.5)
nRBC: 0 % (ref 0.0–0.2)

## 2020-06-01 LAB — BRAIN NATRIURETIC PEPTIDE: B Natriuretic Peptide: 403 pg/mL — ABNORMAL HIGH (ref 0.0–100.0)

## 2020-06-01 MED ORDER — ALBUMIN HUMAN 25 % IV SOLN
25.0000 g | Freq: Once | INTRAVENOUS | Status: AC
  Administered 2020-06-01: 25 g via INTRAVENOUS
  Filled 2020-06-01: qty 100

## 2020-06-01 MED ORDER — METOPROLOL TARTRATE 25 MG PO TABS
12.5000 mg | ORAL_TABLET | Freq: Two times a day (BID) | ORAL | Status: DC
  Administered 2020-06-01 – 2020-06-06 (×10): 12.5 mg via ORAL
  Filled 2020-06-01 (×9): qty 1

## 2020-06-01 MED ORDER — FUROSEMIDE 10 MG/ML IJ SOLN
40.0000 mg | Freq: Two times a day (BID) | INTRAMUSCULAR | Status: AC
  Administered 2020-06-01 – 2020-06-05 (×10): 40 mg via INTRAVENOUS
  Filled 2020-06-01 (×9): qty 4

## 2020-06-01 NOTE — Progress Notes (Signed)
Patient Demographics:    William Barrett, is a 60 y.o. male, DOB - 02-20-1961, IPJ:[id number]  Admit date - 05/26/2020   Admitting Physician Truett Mainland, DO  Outpatient Primary MD for the patient is [name], MD  LOS - 6   Chief Complaint  Patient presents with   Shortness of Breath        Subjective:    William Barrett was seen and examined this morning Stating diarrhea has improved, but still having loose bowel movements.  Confirmed that he is not O2 dependent at home.  Persistently hypoxic, O2 demand has improved from 6 to 5 L oxygen via nasal cannula, satting 91% Mildly hypotensive this morning 98/67 Continue to have shortness of breath and cough worsened with minimal exertion     Assessment  & Plan :    Principal Problem:   Acute on chronic heart failure with preserved ejection fraction (HFpEF)/Acute on Chronic Diastolic HF Active Problems:   Chronic alcohol abuse   Hyponatremia   Alcoholic Cirrhosis (HCC)   Acute respiratory failure with hypoxia (HCC)   Essential hypertension  Brief Summary:- 60 y.o. male with a history of hypertension, chronic alcohol use, alcoholic cirrhosis, heart failure with preserved EF  admitted on 05/26/2020 with worsening shortness of breath and acute hypoxic respiratory failure after running out of diuretics. -Now with diarrhea and CT findings of colitis, C. difficile testing pending -Patient is confirmed that he is not O2 dependent at home now requiring up to 6 L of oxygen on this admission  ------------------------------------------------------------------------------------------------------------------------------------------------------------------   Assessment plan:  1)HFpEF--acute on chronic diastolic dysfunction CHF- - PTA patient was noncompliant with diuretic -Patient admits to  high salt intake and drinks lots of beer  PTA -Dyspnea and hypoxia persist--not worse -Continue IV Lasix given--- increasing to 40 mg twice daily -Repeat chest x-ray on 05/30/2020 with CHF findings -Monitoring daily weight I's and O's -Chest x-ray and BNP consistent with CHF exacerbation   -Echo with EF over 75%, hyperdynamic left ventricle noted severe LVH, no regional wall motion abnormalities -Fluid balance is negative and weight is trending down with diuresis  2)Acute colitis with colonic ileus-- - abdominal x-rays with transverse colonic dilatation,  -CT abdomen and pelvis with colitis extending from the hepatic flexure to the mid transverse colon -Per ID physician okay to stop Unasyn which was initially chosen to cover both colitis and presumed aspiration pneumonia -ID physician recommends Rocephin and azithromycin for very short duration -Patient with diarrhea prior to any antibiotic therapy -Discussed with on-call ID physician Dr. Delfina Redwood--- C. Difficile was negative   GI pathogen persistent diarrhea and finding on CT of colitis  3)Alcoholic liver disease/liver cirrhosis -- - low platelets and high MCV noted -Ammonia is 49, INR and LFTs are not elevated  4)Rt Sided  aspiration pneumonia- -h/o alcoholic abuse -CT abdomen and pelvis from 05/30/2020 consistent with aspiration pneumonia Per ID physician switch to Unasyn and initiated Rocephin azithromycin per recommendation  -Patient with diarrhea prior to any antibiotic therapy -No further fevers  5) hyponatremia- -improving - multifactorial, partly due to beer potomania compounded by hemodilution from CHF and liver cirrhosis -Sodium is up to 134 from 123  --continue gentle diuresis  6)Acute hypoxic respiratory failure-- -not O2 dependent at  home, requiring now up to 6 L of oxygen, has improved to 5 L today still satting 91% -Continue O2 supplements with goal to wean off -secondary to CHF and aspiration pneumonia above, should improve with diuresis and  antibiotic therapy -Chest x-ray on 06/01/2020  7) alcohol withdrawal- - DTs improved clinically  -Benzos as ordered -Folic acid and thiamine as ordered -No signs of withdrawal at this time    Disposition/Need for in-Hospital Stay- patient unable to be discharged at this time due to --acute CHF exacerbation and aspiration pneumonia requiring IV diuresis and IV antibiotics, acute hypoxic respiratory failure requiring supplemental oxygen*  Status is: Inpatient  Remains inpatient appropriate because:Please see above   Disposition: The patient is from: Home              Anticipated d/c is to: Home with home health              Anticipated d/c date is: 2 days              Patient currently is not medically stable to d/c. Barriers: Not Clinically Stable-   Code Status :  -  Code Status: Full Code  Family Communication:    (patient is alert, awake and coherent)  Discussed with patient's sister ([name])  who patient lives with  Consults  : curbSide consult with general surgeon DVT Prophylaxis  :   - SCDs     Lab Results  Component Value Date   PLT 81 (L) 06/01/2020    Inpatient Medications  Scheduled Meds:  acidophilus  1 capsule Oral TID   dextromethorphan-guaiFENesin  1 tablet Oral BID   enoxaparin (LOVENOX) injection  55 mg Subcutaneous W54O   folic acid  1 mg Oral Daily   furosemide  40 mg Intravenous Q12H   [START ON 06/02/2020] metoprolol tartrate  12.5 mg Oral BID   multivitamin with minerals  1 tablet Oral Daily   nicotine  14 mg Transdermal Daily   potassium chloride  40 mEq Oral TID   thiamine  100 mg Oral Daily   Or   thiamine  100 mg Intravenous Daily   Continuous Infusions:  azithromycin 500 mg (05/31/20 1546)   cefTRIAXone (ROCEPHIN)  IV 2 g (05/31/20 1206)   PRN Meds:.acetaminophen, ipratropium-albuterol, ondansetron **OR** ondansetron (ZOFRAN) IV    Anti-infectives (From admission, onward)   Start     Dose/Rate Route Frequency  Ordered Stop   05/31/20 1202  cefTRIAXone (ROCEPHIN) 2 g in sodium chloride 0.9 % 100 mL IVPB        2 g 200 mL/hr over 30 Minutes Intravenous Every 24 hours 05/31/20 1202     05/31/20 1200  cefTRIAXone (ROCEPHIN) 1 g in sodium chloride 0.9 % 100 mL IVPB  Status:  Discontinued        1 g 200 mL/hr over 30 Minutes Intravenous Every 24 hours 05/31/20 1154 05/31/20 1202   05/30/20 1730  Ampicillin-Sulbactam (UNASYN) 3 g in sodium chloride 0.9 % 100 mL IVPB  Status:  Discontinued        3 g 200 mL/hr over 30 Minutes Intravenous Every 6 hours 05/30/20 1624 05/31/20 1154   05/30/20 1700  cefTRIAXone (ROCEPHIN) 1 g in sodium chloride 0.9 % 100 mL IVPB  Status:  Discontinued        1 g 200 mL/hr over 30 Minutes Intravenous Every 24 hours 05/30/20 1616 05/30/20 1618   05/30/20 1700  azithromycin (ZITHROMAX) 500 mg in sodium chloride 0.9 %  250 mL IVPB        500 mg 250 mL/hr over 60 Minutes Intravenous Every 24 hours 05/30/20 1616     05/30/20 1700  metroNIDAZOLE (FLAGYL) IVPB 500 mg  Status:  Discontinued        500 mg 100 mL/hr over 60 Minutes Intravenous Every 8 hours 05/30/20 1616 05/30/20 1618        Objective:   Vitals:   05/31/20 1947 05/31/20 2137 06/01/20 0624 06/01/20 0954  BP:  103/66 98/67 103/66  Pulse: 78 86 83 86  Resp: 16  18   Temp:  98.2 F (36.8 C) 98.1 F (36.7 C)   TempSrc:  Oral Oral   SpO2: 95% 97% 91%   Weight:      Height:        Wt Readings from Last 3 Encounters:  05/28/20 110.6 kg  04/01/19 81.6 kg  01/20/15 77.1 kg     Intake/Output Summary (Last 24 hours) at 06/01/2020 1301 Last data filed at 06/01/2020 0900 Gross per 24 hour  Intake 590 ml  Output 2453 ml  Net -1863 ml       Physical Exam:   General:  Alert, oriented, cooperative, in respite distress complaining of shortness of breath -on 5 L of oxygen via nasal cannula  HEENT:  Normocephalic, PERRL, otherwise with in Normal limits   Neuro:  CNII-XII intact. , normal motor and sensation,  reflexes intact   Lungs:    Bilateral lower lobe rhonchi, crackles, negative any wheezing.. Does not use accessory muscles  Cardio:    S1/S2, RRR, No murmure, No Rubs or Gallops   Abdomen:   Soft, non-tender, bowel sounds active all four quadrants,  no guarding or peritoneal signs.  Muscular skeletal:  Limited exam - in bed, able to move all 4 extremities, Normal strength,  2+ pulses,  symmetric, +3 pitting edema  Skin:  Dry, warm to touch, negative for any Rashes,  Wounds: Please see nursing documentation           Data Review:   Micro Results Recent Results (from the past 240 hour(s))  Resp Panel by RT-PCR (Flu A&B, Covid) Nasopharyngeal Swab     Status: None   Collection Time: 05/26/20  4:13 PM   Specimen: Nasopharyngeal Swab; Nasopharyngeal(NP) swabs in vial transport medium  Result Value Ref Range Status   SARS Coronavirus 2 by RT PCR NEGATIVE NEGATIVE Final    Comment: (NOTE) SARS-CoV-2 target nucleic acids are NOT DETECTED.  The SARS-CoV-2 RNA is generally detectable in upper respiratory specimens during the acute phase of infection. The lowest concentration of SARS-CoV-2 viral copies this assay can detect is 138 copies/mL. A negative result does not preclude SARS-Cov-2 infection and should not be used as the sole basis for treatment or other patient management decisions. A negative result may occur with  improper specimen collection/handling, submission of specimen other than nasopharyngeal swab, presence of viral mutation(s) within the areas targeted by this assay, and inadequate number of viral copies(<138 copies/mL). A negative result must be combined with clinical observations, patient history, and epidemiological information. The expected result is Negative.  Fact Sheet for Patients:  EntrepreneurPulse.com.au  Fact Sheet for Healthcare Providers:  IncredibleEmployment.be  This test is no t yet approved or cleared by the  [place] FDA and  has been authorized for detection and/or diagnosis of SARS-CoV-2 by FDA under an Emergency Use Authorization (EUA). This EUA will remain  in effect (meaning this test can be used) for the  duration of the COVID-19 declaration under Section 564(b)(1) of the Act, 21 U.S.C.section 360bbb-3(b)(1), unless the authorization is terminated  or revoked sooner.       Influenza A by PCR NEGATIVE NEGATIVE Final   Influenza B by PCR NEGATIVE NEGATIVE Final    Comment: (NOTE) The Xpert Xpress SARS-CoV-2/FLU/RSV plus assay is intended as an aid in the diagnosis of influenza from Nasopharyngeal swab specimens and should not be used as a sole basis for treatment. Nasal washings and aspirates are unacceptable for Xpert Xpress SARS-CoV-2/FLU/RSV testing.  Fact Sheet for Patients: EntrepreneurPulse.com.au  Fact Sheet for Healthcare Providers: IncredibleEmployment.be  This test is not yet approved or cleared by the [place] FDA and has been authorized for detection and/or diagnosis of SARS-CoV-2 by FDA under an Emergency Use Authorization (EUA). This EUA will remain in effect (meaning this test can be used) for the duration of the COVID-19 declaration under Section 564(b)(1) of the Act, 21 U.S.C. section 360bbb-3(b)(1), unless the authorization is terminated or revoked.  Performed at [facility], [address] [place], [place] [address]   C Difficile Quick Screen (NO PCR Reflex)     Status: None   Collection Time: 05/31/20  4:30 PM   Specimen: STOOL  Result Value Ref Range Status   C Diff antigen NEGATIVE NEGATIVE Final   C Diff toxin NEGATIVE NEGATIVE Final   C Diff interpretation No C. difficile detected.  Final    Comment: Performed at [facility], [address] [place], [place] [address]    Radiology Reports DG Chest 2 View  Result Date: 06/01/2020 CLINICAL DATA:  60 year old male with shortness of breath. Negative for  COVID-19. 6 days ago. EXAM: CHEST - 2 VIEW COMPARISON:  Chest radiographs 05/28/2020 and earlier. FINDINGS: Upright AP and lateral views of the chest today. Large lung volumes with increased AP dimension to the chest. Stable cardiomegaly and mediastinal contours. Visualized tracheal air column is within normal limits. Pulmonary vascularity and/or interstitial markings appear stable over this series of exams, although increased since 2013. No pneumothorax. No pleural effusion or consolidation. Paucity of bowel gas in the upper abdomen. No acute osseous abnormality identified. IMPRESSION: Chronic cardiomegaly with pulmonary hyperinflation and nonspecific interstitial markings increased since 2013. Cannot exclude viral/atypical respiratory infection, or mild interstitial edema. Otherwise, these could be chronic interstitial lung changes. Electronically Signed   By: Genevie Ann M.D.   On: 06/01/2020 12:01   CT ABDOMEN PELVIS W CONTRAST  Result Date: 05/30/2020 CLINICAL DATA:  Bowel obstruction, constipation, bloating, generalized abdominal pain EXAM: CT ABDOMEN AND PELVIS WITH CONTRAST TECHNIQUE: Multidetector CT imaging of the abdomen and pelvis was performed using the standard protocol following bolus administration of intravenous contrast. Sagittal and coronal MPR images reconstructed from axial data set. CONTRAST:  167mL OMNIPAQUE IOHEXOL 300 MG/ML SOLN IV. Water-soluble rectal contrast administered. COMPARISON:  06/17/2012 FINDINGS: Lower chest: Patchy infiltrates RIGHT lower lobe. Minimal subsegmental atelectasis LEFT lower lobe. Hepatobiliary: Irregular nodular hepatic contours consistent with cirrhosis. Gallbladder contracted. No discrete hepatic mass. Enlargement of lateral segment LEFT lobe. Pancreas: Normal appearance Spleen: Vague area of hypoechogenicity 7 mm diameter, nonspecific Adrenals/Urinary Tract: Slightly malrotated kidneys with hilum directed anteriorly. Adrenal glands, kidneys, ureters, and  bladder otherwise normal appearance Stomach/Bowel: Normal appendix. Segmental wall thickening of the colon from hepatic flexure to mid transverse colon consistent with colitis. Remainder of colon normal appearance. No colonic mass or stricture. Stomach and small bowel loops unremarkable. Vascular/Lymphatic: Atherosclerotic calcifications aorta and iliac arteries without aneurysm. Vascular structures  patent. No adenopathy. Collateral identified from LEFT portal vein to anterior abdominal wall and into the LEFT hypogastric vein. Reproductive: Unremarkable prostate gland and seminal vesicles Other: Scattered subcutaneous edema. No free air or free fluid. Small LEFT inguinal hernia containing fat. Musculoskeletal: Advanced degenerative disc disease changes at L3-L4 with BILATERAL spondylolysis of L3 and grade 1 anterolisthesis. IMPRESSION: Cirrhotic liver with collateral vessel from LEFT portal vein to LEFT hypogastric vein. Segmental wall thickening of the colon from hepatic flexure to mid transverse colon consistent with colitis; differential diagnosis includes infection and inflammatory bowel disease, ischemia considered less likely due to lack of significant vascular disease changes,. Patchy infiltrates RIGHT lower lobe question pneumonia. Small LEFT inguinal hernia containing fat. BILATERAL spondylolysis L3 with grade 1 anterolisthesis at L3-L4. Aortic Atherosclerosis (ICD10-I70.0). Electronically Signed   By: Lavonia Dana M.D.   On: 05/30/2020 16:02   DG Chest Port 1 View  Result Date: 05/28/2020 CLINICAL DATA:  Shortness of breath. EXAM: PORTABLE CHEST 1 VIEW COMPARISON:  May 26, 2020. FINDINGS: Stable cardiomegaly with mild central pulmonary vascular congestion. No pneumothorax or pleural effusion is noted. No consolidative process is noted. Bony thorax is unremarkable. IMPRESSION: Stable cardiomegaly with mild central pulmonary vascular congestion. Electronically Signed   By: Marijo Conception M.D.   On:  05/28/2020 08:20   DG Chest Port 1 View  Result Date: 05/26/2020 CLINICAL DATA:  Short of breath.  Lower extremity edema EXAM: PORTABLE CHEST 1 VIEW COMPARISON:  November 11, 2011 FINDINGS: There is cardiomegaly with mild pulmonary venous hypertension. There is no appreciable edema or airspace opacity. No adenopathy. No bone lesions. IMPRESSION: Cardiomegaly with a degree of pulmonary vascular congestion. No edema or airspace opacity. Electronically Signed   By: Lowella Grip III M.D.   On: 05/26/2020 16:14   DG ABD ACUTE 2+V W 1V CHEST  Result Date: 05/31/2020 CLINICAL DATA:  Abdominal pain and distention. EXAM: DG ABDOMEN ACUTE WITH 1 VIEW CHEST COMPARISON:  May 30, 2020. FINDINGS: There is no evidence of free intraperitoneal air. Stable air-filled transverse colon is noted. No significant small bowel dilatation is noted. No radiopaque calculi or other significant radiographic abnormality is seen. Stable cardiomegaly. Both lungs are clear. IMPRESSION: Stable air-filled transverse colon is noted which may represent ileus. Electronically Signed   By: Marijo Conception M.D.   On: 05/31/2020 08:36   DG ABD ACUTE 2+V W 1V CHEST  Result Date: 05/30/2020 CLINICAL DATA:  Pain.  Recent congestive heart failure EXAM: DG ABDOMEN ACUTE WITH 1 VIEW CHEST COMPARISON:  Chest radiograph May 28, 2020. FINDINGS: AP chest: There is no edema or airspace opacity. There is cardiomegaly with mild pulmonary venous hypertension. No adenopathy. Supine and upright abdomen: There is dilatation of transverse colon. No appreciable small bowel dilatation. No appreciable air-fluid levels. No free air. Probable small phlebolith left pelvis. IMPRESSION: Transverse colon dilatation. Question a degree of colonic ileus. Bowel obstruction not felt to be likely. No free air. Cardiomegaly with a degree of pulmonary vascular congestion noted. No edema or consolidation. Electronically Signed   By: Lowella Grip III M.D.   On:  05/30/2020 08:16   ECHOCARDIOGRAM COMPLETE  Result Date: 05/27/2020    ECHOCARDIOGRAM REPORT   Patient Name:   UMER HARIG Date of Exam: 05/27/2020 Medical Rec #:  [id number]         Height:       68.0 in Accession #:    [id number]        Weight:  251.8 lb Date of Birth:  [birth year]         BSA:          2.254 m Patient Age:    38 years          BP:           126/87 mmHg Patient Gender: M                 HR:           84 bpm. Exam Location:  [facility] Procedure: 2D Echo Indications:    Congestive Heart Failure I50.9  History:        Patient has prior history of Echocardiogram examinations, most                 recent 12/30/2012. CHF; Risk Factors:Current Smoker and                 Hypertension. Chronic alcohol abuse, Acute respiratory failure                 with hypoxia.  Sonographer:    Leavy Cella RDCS (AE) Referring Phys: [place]  1. Left ventricular ejection fraction, by estimation, is >75%. The left ventricle has hyperdynamic function. The left ventricle has no regional wall motion abnormalities. There is severe left ventricular hypertrophy. Left ventricular diastolic parameters were normal.  2. Right ventricular systolic function is mildly reduced. The right ventricular size is mildly enlarged.  3. Right atrial size was mildly dilated.  4. The aortic valve is tricuspid. Aortic valve regurgitation is not visualized. Mild aortic valve sclerosis is present, with no evidence of aortic valve stenosis.  5. The inferior vena cava is normal in size with greater than 50% respiratory variability, suggesting right atrial pressure of 3 mmHg. FINDINGS  Left Ventricle: Left ventricular ejection fraction, by estimation, is >75%. The left ventricle has hyperdynamic function. The left ventricle has no regional wall motion abnormalities. The left ventricular internal cavity size was normal in size. There is severe left ventricular hypertrophy. Left ventricular diastolic parameters  were normal. Right Ventricle: The right ventricular size is mildly enlarged. Right vetricular wall thickness was not assessed. Right ventricular systolic function is mildly reduced. Left Atrium: Left atrial size was normal in size. Right Atrium: Right atrial size was mildly dilated. Pericardium: There is no evidence of pericardial effusion. Mitral Valve: The mitral valve is abnormal. There is mild thickening of the mitral valve leaflet(s). Mild mitral annular calcification. Trivial mitral valve regurgitation. Tricuspid Valve: The tricuspid valve is normal in structure. Tricuspid valve regurgitation is mild. Aortic Valve: The aortic valve is tricuspid. Aortic valve regurgitation is not visualized. Mild aortic valve sclerosis is present, with no evidence of aortic valve stenosis. Aortic valve mean gradient measures 11.7 mmHg. Aortic valve peak gradient measures 16.8 mmHg. Aortic valve area, by VTI measures 3.11 cm. Pulmonic Valve: The pulmonic valve was normal in structure. Pulmonic valve regurgitation is not visualized. Aorta: The aortic root and ascending aorta are structurally normal, with no evidence of dilitation. Venous: The inferior vena cava is normal in size with greater than 50% respiratory variability, suggesting right atrial pressure of 3 mmHg.  LEFT VENTRICLE PLAX 2D LVIDd:         4.58 cm  Diastology LVIDs:         2.22 cm  LV e' medial:    8.38 cm/s LV PW:         1.84 cm  LV E/e'  medial:  13.7 LV IVS:        1.47 cm  LV e' lateral:   10.20 cm/s LVOT diam:     2.10 cm  LV E/e' lateral: 11.3 LV SV:         134 LV SV Index:   60 LVOT Area:     3.46 cm  RIGHT VENTRICLE RV S prime:     14.10 cm/s TAPSE (M-mode): 2.8 cm LEFT ATRIUM             Index       RIGHT ATRIUM           Index LA diam:        4.90 cm 2.17 cm/m  RA Area:     20.00 cm LA Vol (A2C):   75.6 ml 33.54 ml/m RA Volume:   59.40 ml  26.36 ml/m LA Vol (A4C):   48.8 ml 21.65 ml/m LA Biplane Vol: 63.3 ml 28.09 ml/m  AORTIC VALVE AV Area  (Vmax):    2.97 cm AV Area (Vmean):   2.92 cm AV Area (VTI):     3.11 cm AV Vmax:           205.00 cm/s AV Vmean:          164.333 cm/s AV VTI:            0.431 m AV Peak Grad:      16.8 mmHg AV Mean Grad:      11.7 mmHg LVOT Vmax:         176.00 cm/s LVOT Vmean:        138.667 cm/s LVOT VTI:          0.387 m LVOT/AV VTI ratio: 0.90  AORTA Ao Root diam: 3.40 cm MITRAL VALVE                TRICUSPID VALVE MV Area (PHT): 2.99 cm     TR Peak grad:   29.4 mmHg MV Decel Time: 254 msec     TR Vmax:        271.00 cm/s MR Peak grad: 62.1 mmHg MR Vmax:      394.00 cm/s   SHUNTS MV E velocity: 115.00 cm/s  Systemic VTI:  0.39 m MV A velocity: 86.70 cm/s   Systemic Diam: 2.10 cm MV E/A ratio:  1.33 Dorris Carnes MD Electronically signed by Dorris Carnes MD Signature Date/Time: 05/27/2020/6:36:32 PM    Final      CBC Recent Labs  Lab 05/26/20 1544 05/29/20 0545 05/31/20 0606 06/01/20 0526  WBC 6.0 6.3 5.3 5.5  HGB 15.1 14.8 15.2 14.6  HCT 46.0 46.3 48.5 46.2  PLT 108* 81* 91* 81*  MCV 113.9* 115.8* 116.3* 117.9*  MCH 37.4* 37.0* 36.5* 37.2*  MCHC 32.8 32.0 31.3 31.6  RDW 15.4 14.9 14.4 14.1  LYMPHSABS 1.1  --   --   --   MONOABS 0.7  --   --   --   EOSABS 0.6*  --   --   --   BASOSABS 0.1  --   --   --     Chemistries  Recent Labs  Lab 05/26/20 1544 05/26/20 2100 05/28/20 0535 05/29/20 0545 05/30/20 0540 05/31/20 0606 06/01/20 0526  NA 123*   < > 130*   131* 132*   132* 131* 134* 130*  K 4.4   < > 4.1   4.2 3.2*   3.2* 3.4* 4.3 4.5  CL 86*   < > 82*  82* 80*   81* 82* 87* 87*  CO2 32   < > 37*   38* 40*   39* 38* 40* 37*  GLUCOSE 140*   < > 101*   101* 92   93 101* 99 94  BUN 10   < > 11   10 12   12 13 12 12   CREATININE 0.78   < > 0.75   0.73 0.65   0.66 0.72 0.63 0.60*  CALCIUM 8.1*   < > 8.1*   8.1* 8.1*   8.0* 8.0* 8.2* 8.1*  MG  --   --  1.7  --   --  2.0  --   AST 28  --   --   --   --   --  30  ALT 30  --   --   --   --   --  28  ALKPHOS 84  --   --   --   --   --  58  BILITOT  0.5  --   --   --   --   --  1.2   < > = values in this interval not displayed.    Coagulation profile Recent Labs  Lab 05/26/20 1544  INR 1.1   ------------------------------------------------------------------------------------------------------------------    Component Value Date/Time   BNP 403.0 (H) 06/01/2020 0526      SIGNED: Deatra James, MD, FHM. [facility],  Pager (please use [email or web address] to page/text)  Please use Epic Secure Chat for non-urgent communication (7AM-7PM) If 7PM-7AM, please contact night-coverage [email or web address],  06/01/2020, 1:01 PM

## 2020-06-02 DIAGNOSIS — I5033 Acute on chronic diastolic (congestive) heart failure: Secondary | ICD-10-CM | POA: Diagnosis not present

## 2020-06-02 LAB — BRAIN NATRIURETIC PEPTIDE: B Natriuretic Peptide: 428 pg/mL — ABNORMAL HIGH (ref 0.0–100.0)

## 2020-06-02 MED ORDER — ALBUMIN HUMAN 25 % IV SOLN
25.0000 g | Freq: Once | INTRAVENOUS | Status: AC
  Administered 2020-06-02: 25 g via INTRAVENOUS
  Filled 2020-06-02: qty 100

## 2020-06-02 NOTE — Progress Notes (Signed)
Patient Demographics:    William Barrett, is a 60 y.o. male, DOB - Mar 04, 1961, ONG:[id number]  Admit date - 05/26/2020   Admitting Physician Truett Mainland, DO  Outpatient Primary MD for the patient is William Berry, MD  LOS - 7  Chief Complaint  Patient presents with  . Shortness of Breath        Subjective:    [name] was seen and examined this morning, still having shortness of breath, on 5 L of oxygen by nasal cannula, satting much better 97% today versus 91% yesterday Stating lower extremity edema has improved Stool x4 past 24 hours, diarrhea improving  Denies any chest pain, No issues overnight  I's and O's >> negative 1050    Assessment  & Plan :    Principal Problem:   Acute on chronic heart failure with preserved ejection fraction (HFpEF)/Acute on Chronic Diastolic HF Active Problems:   Chronic alcohol abuse   Hyponatremia   Alcoholic Cirrhosis (HCC)   Acute respiratory failure with hypoxia (HCC)   Essential hypertension  Brief Summary:- 60 y.o. male with a history of hypertension, chronic alcohol use, alcoholic cirrhosis, heart failure with preserved EF  admitted on 05/26/2020 with worsening shortness of breath and acute hypoxic respiratory failure after running out of diuretics. -Now with diarrhea and CT findings of colitis, C. difficile testing pending -Patient is confirmed that he is not O2 dependent at home now requiring up to 6 L of oxygen on this admission  ------------------------------------------------------------------------------------------------------------------------------------------------------------------   Assessment plan:  1)HFpEF--acute on chronic diastolic dysfunction CHF- - PTA patient was noncompliant with diuretic -Patient admits to  high salt intake and drinks lots of beer PTA -Dyspnea and hypoxia persist--improving -Continue IV Lasix  given--- increasing to 40 mg twice daily --- we will continue for today  -Repleting albumin again today -Repeat chest x-ray on 05/30/2020 with CHF findings -Monitoring daily weight I's and O's -Chest x-ray and BNP consistent with CHF exacerbation  Intake/Output Summary (Last 24 hours) at 06/02/2020 1037 Last data filed at 06/02/2020 0300 Gross per 24 hour  Intake 1310 ml  Output 2600 ml  Net -1290 ml   -Echo with EF over 75%, hyperdynamic left ventricle noted severe LVH, no regional wall motion abnormalities -Fluid balance is negative and weight is trending down with diuresis  2)Acute colitis with colonic ileus-- - abdominal x-rays with transverse colonic dilatation,  -CT abdomen and pelvis with colitis extending from the hepatic flexure to the mid transverse colon -Per ID physician okay to stop Unasyn which was initially chosen to cover both colitis and presumed aspiration pneumonia -ID physician recommends Rocephin and azithromycin for very short duration -Patient with diarrhea prior to any antibiotic therapy -Discussed with on-call ID physician Dr. Delfina Redwood--- C. Difficile was negative   GI pathogen persistent diarrhea and finding on CT of colitis -Improving diarrhea, for stool past 24 hours  3)Alcoholic liver disease/liver cirrhosis -- - low platelets and high MCV noted -Ammonia is 49, INR and LFTs are not elevated  4)Rt Sided  aspiration pneumonia- -h/o alcoholic abuse -remained stable -CT abdomen and pelvis from 05/30/2020 consistent with aspiration pneumonia Per ID physician switch to Unasyn and initiated Rocephin azithromycin per recommendation  -Patient with diarrhea prior to any antibiotic therapy -  No further fevers  5) hyponatremia- -improving  - multifactorial, partly due to beer potomania compounded by hemodilution from CHF and liver cirrhosis -Sodium 123--134, 130  --continue gentle diuresis  6)Acute hypoxic respiratory failure-- -not O2 dependent at  home -Required up to 6 L of oxygen by nasal cannula, down to 5 L, O2 sat improved from 91 to 97% -Tapering off oxygen -goal to maintain O2 sat greater than 94%  -secondary to CHF and aspiration pneumonia above, should improve with diuresis and antibiotic therapy -Chest x-ray on 06/01/2020  7) alcohol withdrawal- - DTs improved clinically  -Benzos as ordered -Folic acid and thiamine as ordered -No signs of withdrawal at this time    Disposition/Need for in-Hospital Stay- patient unable to be discharged at this time due to --acute CHF exacerbation and aspiration pneumonia requiring IV diuresis and IV antibiotics, acute hypoxic respiratory failure requiring supplemental oxygen*  Status is: Inpatient  Remains inpatient appropriate because:Please see above   Disposition: The patient is from: Home              Anticipated d/c is to: Home with home health              Anticipated d/c date is: 2 days              Patient currently is not medically stable to d/c. Barriers: Not Clinically Stable-   Code Status :  -  Code Status: Full Code  Family Communication:    (patient is alert, awake and coherent)  Discussed with patient's sister (William Barrett)  who patient lives with  Consults  : curbSide consult with general surgeon DVT Prophylaxis  :   - SCDs     Lab Results  Component Value Date   PLT 81 (L) 06/01/2020    Inpatient Medications  Scheduled Meds: . acidophilus  1 capsule Oral TID  . dextromethorphan-guaiFENesin  1 tablet Oral BID  . enoxaparin (LOVENOX) injection  55 mg Subcutaneous Q24H  . folic acid  1 mg Oral Daily  . furosemide  40 mg Intravenous Q12H  . metoprolol tartrate  12.5 mg Oral BID  . multivitamin with minerals  1 tablet Oral Daily  . nicotine  14 mg Transdermal Daily  . potassium chloride  40 mEq Oral TID  . thiamine  100 mg Oral Daily   Or  . thiamine  100 mg Intravenous Daily   Continuous Infusions: . azithromycin 500 mg (06/01/20 1725)  .  cefTRIAXone (ROCEPHIN)  IV 2 g (06/01/20 1332)   PRN Meds:.acetaminophen, ipratropium-albuterol, ondansetron **OR** ondansetron (ZOFRAN) IV    Anti-infectives (From admission, onward)   Start     Dose/Rate Route Frequency Ordered Stop   05/31/20 1202  cefTRIAXone (ROCEPHIN) 2 g in sodium chloride 0.9 % 100 mL IVPB        2 g 200 mL/hr over 30 Minutes Intravenous Every 24 hours 05/31/20 1202     05/31/20 1200  cefTRIAXone (ROCEPHIN) 1 g in sodium chloride 0.9 % 100 mL IVPB  Status:  Discontinued        1 g 200 mL/hr over 30 Minutes Intravenous Every 24 hours 05/31/20 1154 05/31/20 1202   05/30/20 1730  Ampicillin-Sulbactam (UNASYN) 3 g in sodium chloride 0.9 % 100 mL IVPB  Status:  Discontinued        3 g 200 mL/hr over 30 Minutes Intravenous Every 6 hours 05/30/20 1624 05/31/20 1154   05/30/20 1700  cefTRIAXone (ROCEPHIN) 1 g in sodium chloride 0.9 %  100 mL IVPB  Status:  Discontinued        1 g 200 mL/hr over 30 Minutes Intravenous Every 24 hours 05/30/20 1616 05/30/20 1618   05/30/20 1700  azithromycin (ZITHROMAX) 500 mg in sodium chloride 0.9 % 250 mL IVPB        500 mg 250 mL/hr over 60 Minutes Intravenous Every 24 hours 05/30/20 1616     05/30/20 1700  metroNIDAZOLE (FLAGYL) IVPB 500 mg  Status:  Discontinued        500 mg 100 mL/hr over 60 Minutes Intravenous Every 8 hours 05/30/20 1616 05/30/20 1618        Objective:   Vitals:   06/01/20 2056 06/02/20 0451 06/02/20 0500 06/02/20 0852  BP: 108/71 111/69  116/74  Pulse: 88 99  90  Resp: 18 18    Temp: (!) 97.4 F (36.3 C) 98 F (36.7 C)    TempSrc:  Oral    SpO2: 92% 97%    Weight:   105.9 kg   Height:        Wt Readings from Last 3 Encounters:  06/02/20 105.9 kg  04/01/19 81.6 kg  01/20/15 77.1 kg     Intake/Output Summary (Last 24 hours) at 06/02/2020 1037 Last data filed at 06/02/2020 0300 Gross per 24 hour  Intake 1310 ml  Output 2600 ml  Net -1290 ml      Physical Exam:   General:  Alert,  oriented, cooperative, still SOB on 5L O2 via [place]    HEENT:  Normocephalic, PERRL, otherwise with in Normal limits   Neuro:  CNII-XII intact. , normal motor and sensation, reflexes intact   Lungs:   Clear to auscultation BL, Respirations unlabored, no wheezes / crackles  Cardio:    S1/S2, RRR, No murmure, No Rubs or Gallops   Abdomen:   Soft, non-tender, bowel sounds active all four quadrants,  no guarding or peritoneal signs.  Muscular skeletal:  Limited exam - in bed, able to move all 4 extremities, Normal strength,  2+ pulses,  symmetric, +2 pitting edema  Skin:  Dry, warm to touch, negative for any Rashes,  Wounds: Please see nursing documentation              Data Review:   Micro Results Recent Results (from the past 240 hour(s))  Resp Panel by RT-PCR (Flu A&B, Covid) Nasopharyngeal Swab     Status: None   Collection Time: 05/26/20  4:13 PM   Specimen: Nasopharyngeal Swab; Nasopharyngeal(NP) swabs in vial transport medium  Result Value Ref Range Status   SARS Coronavirus 2 by RT PCR NEGATIVE NEGATIVE Final    Comment: (NOTE) SARS-CoV-2 target nucleic acids are NOT DETECTED.  The SARS-CoV-2 RNA is generally detectable in upper respiratory specimens during the acute phase of infection. The lowest concentration of SARS-CoV-2 viral copies this assay can detect is 138 copies/mL. A negative result does not preclude SARS-Cov-2 infection and should not be used as the sole basis for treatment or other patient management decisions. A negative result may occur with  improper specimen collection/handling, submission of specimen other than nasopharyngeal swab, presence of viral mutation(s) within the areas targeted by this assay, and inadequate number of viral copies(<138 copies/mL). A negative result must be combined with clinical observations, patient history, and epidemiological information. The expected result is Negative.  Fact Sheet for Patients:   EntrepreneurPulse.com.au  Fact Sheet for Healthcare Providers:  IncredibleEmployment.be  This test is no t yet approved or cleared by the [place]  States FDA and  has been authorized for detection and/or diagnosis of SARS-CoV-2 by FDA under an Emergency Use Authorization (EUA). This EUA will remain  in effect (meaning this test can be used) for the duration of the COVID-19 declaration under Section 564(b)(1) of the Act, 21 U.S.C.section 360bbb-3(b)(1), unless the authorization is terminated  or revoked sooner.       Influenza A by PCR NEGATIVE NEGATIVE Final   Influenza B by PCR NEGATIVE NEGATIVE Final    Comment: (NOTE) The Xpert Xpress SARS-CoV-2/FLU/RSV plus assay is intended as an aid in the diagnosis of influenza from Nasopharyngeal swab specimens and should not be used as a sole basis for treatment. Nasal washings and aspirates are unacceptable for Xpert Xpress SARS-CoV-2/FLU/RSV testing.  Fact Sheet for Patients: EntrepreneurPulse.com.au  Fact Sheet for Healthcare Providers: IncredibleEmployment.be  This test is not yet approved or cleared by the [place] FDA and has been authorized for detection and/or diagnosis of SARS-CoV-2 by FDA under an Emergency Use Authorization (EUA). This EUA will remain in effect (meaning this test can be used) for the duration of the COVID-19 declaration under Section 564(b)(1) of the Act, 21 U.S.C. section 360bbb-3(b)(1), unless the authorization is terminated or revoked.  Performed at [facility], [address] [place], [place] [address]   C Difficile Quick Screen (NO PCR Reflex)     Status: None   Collection Time: 05/31/20  4:30 PM   Specimen: STOOL  Result Value Ref Range Status   C Diff antigen NEGATIVE NEGATIVE Final   C Diff toxin NEGATIVE NEGATIVE Final   C Diff interpretation No C. difficile detected.  Final    Comment: Performed at [facility], [address], [place], [place] [address]  Gastrointestinal Panel by PCR , Stool     Status: None   Collection Time: 05/31/20  4:30 PM   Specimen: STOOL  Result Value Ref Range Status   Campylobacter species NOT DETECTED NOT DETECTED Final   Plesimonas shigelloides NOT DETECTED NOT DETECTED Final   Salmonella species NOT DETECTED NOT DETECTED Final   Yersinia enterocolitica NOT DETECTED NOT DETECTED Final   Vibrio species NOT DETECTED NOT DETECTED Final   Vibrio cholerae NOT DETECTED NOT DETECTED Final   Enteroaggregative E coli (EAEC) NOT DETECTED NOT DETECTED Final   Enteropathogenic E coli (EPEC) NOT DETECTED NOT DETECTED Final   Enterotoxigenic E coli (ETEC) NOT DETECTED NOT DETECTED Final   Shiga like toxin producing E coli (STEC) NOT DETECTED NOT DETECTED Final   Shigella/Enteroinvasive E coli (EIEC) NOT DETECTED NOT DETECTED Final   Cryptosporidium NOT DETECTED NOT DETECTED Final   Cyclospora cayetanensis NOT DETECTED NOT DETECTED Final   Entamoeba histolytica NOT DETECTED NOT DETECTED Final   Giardia lamblia NOT DETECTED NOT DETECTED Final   Adenovirus F40/41 NOT DETECTED NOT DETECTED Final   Astrovirus NOT DETECTED NOT DETECTED Final   Norovirus GI/GII NOT DETECTED NOT DETECTED Final   Rotavirus A NOT DETECTED NOT DETECTED Final   Sapovirus (I, II, IV, and V) NOT DETECTED NOT DETECTED Final    Comment: Performed at [facility], [address], [place], [place] [address]    Radiology Reports DG Chest 2 View  Result Date: 06/01/2020 CLINICAL DATA:  60 year old male with shortness of breath. Negative for COVID-19. 6 days ago. EXAM: CHEST - 2 VIEW COMPARISON:  Chest radiographs 05/28/2020 and earlier. FINDINGS: Upright AP and lateral views of the chest today. Large lung volumes with increased AP dimension to the chest. Stable  cardiomegaly and mediastinal contours. Visualized tracheal air column is within normal limits. Pulmonary vascularity and/or interstitial  markings appear stable over this series of exams, although increased since 2013. No pneumothorax. No pleural effusion or consolidation. Paucity of bowel gas in the upper abdomen. No acute osseous abnormality identified. IMPRESSION: Chronic cardiomegaly with pulmonary hyperinflation and nonspecific interstitial markings increased since 2013. Cannot exclude viral/atypical respiratory infection, or mild interstitial edema. Otherwise, these could be chronic interstitial lung changes. Electronically Signed   By: Genevie Ann M.D.   On: 06/01/2020 12:01   CT ABDOMEN PELVIS W CONTRAST  Result Date: 05/30/2020 CLINICAL DATA:  Bowel obstruction, constipation, bloating, generalized abdominal pain EXAM: CT ABDOMEN AND PELVIS WITH CONTRAST TECHNIQUE: Multidetector CT imaging of the abdomen and pelvis was performed using the standard protocol following bolus administration of intravenous contrast. Sagittal and coronal MPR images reconstructed from axial data set. CONTRAST:  180mL OMNIPAQUE IOHEXOL 300 MG/ML SOLN IV. Water-soluble rectal contrast administered. COMPARISON:  06/17/2012 FINDINGS: Lower chest: Patchy infiltrates RIGHT lower lobe. Minimal subsegmental atelectasis LEFT lower lobe. Hepatobiliary: Irregular nodular hepatic contours consistent with cirrhosis. Gallbladder contracted. No discrete hepatic mass. Enlargement of lateral segment LEFT lobe. Pancreas: Normal appearance Spleen: Vague area of hypoechogenicity 7 mm diameter, nonspecific Adrenals/Urinary Tract: Slightly malrotated kidneys with hilum directed anteriorly. Adrenal glands, kidneys, ureters, and bladder otherwise normal appearance Stomach/Bowel: Normal appendix. Segmental wall thickening of the colon from hepatic flexure to mid transverse colon consistent with colitis. Remainder of colon normal appearance. No colonic mass or stricture. Stomach and small bowel loops unremarkable. Vascular/Lymphatic: Atherosclerotic calcifications aorta and iliac arteries  without aneurysm. Vascular structures patent. No adenopathy. Collateral identified from LEFT portal vein to anterior abdominal wall and into the LEFT hypogastric vein. Reproductive: Unremarkable prostate gland and seminal vesicles Other: Scattered subcutaneous edema. No free air or free fluid. Small LEFT inguinal hernia containing fat. Musculoskeletal: Advanced degenerative disc disease changes at L3-L4 with BILATERAL spondylolysis of L3 and grade 1 anterolisthesis. IMPRESSION: Cirrhotic liver with collateral vessel from LEFT portal vein to LEFT hypogastric vein. Segmental wall thickening of the colon from hepatic flexure to mid transverse colon consistent with colitis; differential diagnosis includes infection and inflammatory bowel disease, ischemia considered less likely due to lack of significant vascular disease changes,. Patchy infiltrates RIGHT lower lobe question pneumonia. Small LEFT inguinal hernia containing fat. BILATERAL spondylolysis L3 with grade 1 anterolisthesis at L3-L4. Aortic Atherosclerosis (ICD10-I70.0). Electronically Signed   By: Lavonia Dana M.D.   On: 05/30/2020 16:02   DG Chest Port 1 View  Result Date: 05/28/2020 CLINICAL DATA:  Shortness of breath. EXAM: PORTABLE CHEST 1 VIEW COMPARISON:  May 26, 2020. FINDINGS: Stable cardiomegaly with mild central pulmonary vascular congestion. No pneumothorax or pleural effusion is noted. No consolidative process is noted. Bony thorax is unremarkable. IMPRESSION: Stable cardiomegaly with mild central pulmonary vascular congestion. Electronically Signed   By: Marijo Conception M.D.   On: 05/28/2020 08:20   DG Chest Port 1 View  Result Date: 05/26/2020 CLINICAL DATA:  Short of breath.  Lower extremity edema EXAM: PORTABLE CHEST 1 VIEW COMPARISON:  November 11, 2011 FINDINGS: There is cardiomegaly with mild pulmonary venous hypertension. There is no appreciable edema or airspace opacity. No adenopathy. No bone lesions. IMPRESSION: Cardiomegaly with  a degree of pulmonary vascular congestion. No edema or airspace opacity. Electronically Signed   By: Lowella Grip III M.D.   On: 05/26/2020 16:14   DG ABD ACUTE 2+V W 1V CHEST  Result Date: 05/31/2020 CLINICAL  DATA:  Abdominal pain and distention. EXAM: DG ABDOMEN ACUTE WITH 1 VIEW CHEST COMPARISON:  May 30, 2020. FINDINGS: There is no evidence of free intraperitoneal air. Stable air-filled transverse colon is noted. No significant small bowel dilatation is noted. No radiopaque calculi or other significant radiographic abnormality is seen. Stable cardiomegaly. Both lungs are clear. IMPRESSION: Stable air-filled transverse colon is noted which may represent ileus. Electronically Signed   By: Marijo Conception M.D.   On: 05/31/2020 08:36   DG ABD ACUTE 2+V W 1V CHEST  Result Date: 05/30/2020 CLINICAL DATA:  Pain.  Recent congestive heart failure EXAM: DG ABDOMEN ACUTE WITH 1 VIEW CHEST COMPARISON:  Chest radiograph May 28, 2020. FINDINGS: AP chest: There is no edema or airspace opacity. There is cardiomegaly with mild pulmonary venous hypertension. No adenopathy. Supine and upright abdomen: There is dilatation of transverse colon. No appreciable small bowel dilatation. No appreciable air-fluid levels. No free air. Probable small phlebolith left pelvis. IMPRESSION: Transverse colon dilatation. Question a degree of colonic ileus. Bowel obstruction not felt to be likely. No free air. Cardiomegaly with a degree of pulmonary vascular congestion noted. No edema or consolidation. Electronically Signed   By: Lowella Grip III M.D.   On: 05/30/2020 08:16   ECHOCARDIOGRAM COMPLETE  Result Date: 05/27/2020    ECHOCARDIOGRAM REPORT   Patient Name:   MAXUM CASSARINO Date of Exam: 05/27/2020 Medical Rec #:  [id number]         Height:       68.0 in Accession #:    [id number]        Weight:       251.8 lb Date of Birth:  March 29, 1960         BSA:          2.254 m Patient Age:    11 years          BP:            126/87 mmHg Patient Gender: M                 HR:           84 bpm. Exam Location:  [facility] Procedure: 2D Echo Indications:    Congestive Heart Failure I50.9  History:        Patient has prior history of Echocardiogram examinations, most                 recent 12/30/2012. CHF; Risk Factors:Current Smoker and                 Hypertension. Chronic alcohol abuse, Acute respiratory failure                 with hypoxia.  Sonographer:    Leavy Cella RDCS (AE) Referring Phys: [place]  1. Left ventricular ejection fraction, by estimation, is >75%. The left ventricle has hyperdynamic function. The left ventricle has no regional wall motion abnormalities. There is severe left ventricular hypertrophy. Left ventricular diastolic parameters were normal.  2. Right ventricular systolic function is mildly reduced. The right ventricular size is mildly enlarged.  3. Right atrial size was mildly dilated.  4. The aortic valve is tricuspid. Aortic valve regurgitation is not visualized. Mild aortic valve sclerosis is present, with no evidence of aortic valve stenosis.  5. The inferior vena cava is normal in size with greater than 50% respiratory variability, suggesting right atrial pressure of 3 mmHg. FINDINGS  Left Ventricle: Left  ventricular ejection fraction, by estimation, is >75%. The left ventricle has hyperdynamic function. The left ventricle has no regional wall motion abnormalities. The left ventricular internal cavity size was normal in size. There is severe left ventricular hypertrophy. Left ventricular diastolic parameters were normal. Right Ventricle: The right ventricular size is mildly enlarged. Right vetricular wall thickness was not assessed. Right ventricular systolic function is mildly reduced. Left Atrium: Left atrial size was normal in size. Right Atrium: Right atrial size was mildly dilated. Pericardium: There is no evidence of pericardial effusion. Mitral Valve: The mitral valve is  abnormal. There is mild thickening of the mitral valve leaflet(s). Mild mitral annular calcification. Trivial mitral valve regurgitation. Tricuspid Valve: The tricuspid valve is normal in structure. Tricuspid valve regurgitation is mild. Aortic Valve: The aortic valve is tricuspid. Aortic valve regurgitation is not visualized. Mild aortic valve sclerosis is present, with no evidence of aortic valve stenosis. Aortic valve mean gradient measures 11.7 mmHg. Aortic valve peak gradient measures 16.8 mmHg. Aortic valve area, by VTI measures 3.11 cm. Pulmonic Valve: The pulmonic valve was normal in structure. Pulmonic valve regurgitation is not visualized. Aorta: The aortic root and ascending aorta are structurally normal, with no evidence of dilitation. Venous: The inferior vena cava is normal in size with greater than 50% respiratory variability, suggesting right atrial pressure of 3 mmHg.  LEFT VENTRICLE PLAX 2D LVIDd:         4.58 cm  Diastology LVIDs:         2.22 cm  LV e' medial:    8.38 cm/s LV PW:         1.84 cm  LV E/e' medial:  13.7 LV IVS:        1.47 cm  LV e' lateral:   10.20 cm/s LVOT diam:     2.10 cm  LV E/e' lateral: 11.3 LV SV:         134 LV SV Index:   60 LVOT Area:     3.46 cm  RIGHT VENTRICLE RV S prime:     14.10 cm/s TAPSE (M-mode): 2.8 cm LEFT ATRIUM             Index       RIGHT ATRIUM           Index LA diam:        4.90 cm 2.17 cm/m  RA Area:     20.00 cm LA Vol (A2C):   75.6 ml 33.54 ml/m RA Volume:   59.40 ml  26.36 ml/m LA Vol (A4C):   48.8 ml 21.65 ml/m LA Biplane Vol: 63.3 ml 28.09 ml/m  AORTIC VALVE AV Area (Vmax):    2.97 cm AV Area (Vmean):   2.92 cm AV Area (VTI):     3.11 cm AV Vmax:           205.00 cm/s AV Vmean:          164.333 cm/s AV VTI:            0.431 m AV Peak Grad:      16.8 mmHg AV Mean Grad:      11.7 mmHg LVOT Vmax:         176.00 cm/s LVOT Vmean:        138.667 cm/s LVOT VTI:          0.387 m LVOT/AV VTI ratio: 0.90  AORTA Ao Root diam: 3.40 cm MITRAL VALVE  TRICUSPID VALVE MV Area (PHT): 2.99 cm     TR Peak grad:   29.4 mmHg MV Decel Time: 254 msec     TR Vmax:        271.00 cm/s MR Peak grad: 62.1 mmHg MR Vmax:      394.00 cm/s   SHUNTS MV E velocity: 115.00 cm/s  Systemic VTI:  0.39 m MV A velocity: 86.70 cm/s   Systemic Diam: 2.10 cm MV E/A ratio:  1.33 Dorris Carnes MD Electronically signed by Dorris Carnes MD Signature Date/Time: 05/27/2020/6:36:32 PM    Final      CBC Recent Labs  Lab 05/26/20 1544 05/29/20 0545 05/31/20 0606 06/01/20 0526  WBC 6.0 6.3 5.3 5.5  HGB 15.1 14.8 15.2 14.6  HCT 46.0 46.3 48.5 46.2  PLT 108* 81* 91* 81*  MCV 113.9* 115.8* 116.3* 117.9*  MCH 37.4* 37.0* 36.5* 37.2*  MCHC 32.8 32.0 31.3 31.6  RDW 15.4 14.9 14.4 14.1  LYMPHSABS 1.1  --   --   --   MONOABS 0.7  --   --   --   EOSABS 0.6*  --   --   --   BASOSABS 0.1  --   --   --     Chemistries  Recent Labs  Lab 05/26/20 1544 05/26/20 2100 05/28/20 0535 05/29/20 0545 05/30/20 0540 05/31/20 0606 06/01/20 0526  NA 123*   < > 130*  131* 132*  132* 131* 134* 130*  K 4.4   < > 4.1  4.2 3.2*  3.2* 3.4* 4.3 4.5  CL 86*   < > 82*  82* 80*  81* 82* 87* 87*  CO2 32   < > 37*  38* 40*  39* 38* 40* 37*  GLUCOSE 140*   < > 101*  101* 92  93 101* 99 94  BUN 10   < > 11  10 12  12 13 12 12   CREATININE 0.78   < > 0.75  0.73 0.65  0.66 0.72 0.63 0.60*  CALCIUM 8.1*   < > 8.1*  8.1* 8.1*  8.0* 8.0* 8.2* 8.1*  MG  --   --  1.7  --   --  2.0  --   AST 28  --   --   --   --   --  30  ALT 30  --   --   --   --   --  28  ALKPHOS 84  --   --   --   --   --  58  BILITOT 0.5  --   --   --   --   --  1.2   < > = values in this interval not displayed.    Coagulation profile Recent Labs  Lab 05/26/20 1544  INR 1.1   ------------------------------------------------------------------------------------------------------------------    Component Value Date/Time   BNP 428.0 (H) 06/02/2020 0545      SIGNED: Deatra James, MD,  FHM. [facility],  Pager (please use [email or web address] to page/text)  Please use Epic Secure Chat for non-urgent communication (7AM-7PM) If 7PM-7AM, please contact night-coverage [email or web address],  06/02/2020, 10:37 AM

## 2020-06-03 DIAGNOSIS — I5033 Acute on chronic diastolic (congestive) heart failure: Secondary | ICD-10-CM | POA: Diagnosis not present

## 2020-06-03 LAB — COMPREHENSIVE METABOLIC PANEL
ALT: 32 U/L (ref 0–44)
AST: 33 U/L (ref 15–41)
Albumin: 3.4 g/dL — ABNORMAL LOW (ref 3.5–5.0)
Alkaline Phosphatase: 57 U/L (ref 38–126)
Anion gap: 10 (ref 5–15)
BUN: 11 mg/dL (ref 6–20)
CO2: 35 mmol/L — ABNORMAL HIGH (ref 22–32)
Calcium: 8.6 mg/dL — ABNORMAL LOW (ref 8.9–10.3)
Chloride: 87 mmol/L — ABNORMAL LOW (ref 98–111)
Creatinine, Ser: 0.57 mg/dL — ABNORMAL LOW (ref 0.61–1.24)
GFR, Estimated: >60 mL/min (ref >60)
Glucose, Bld: 86 mg/dL (ref 70–99)
Potassium: 4.3 mmol/L (ref 3.5–5.1)
Sodium: 132 mmol/L — ABNORMAL LOW (ref 135–145)
Total Bilirubin: 1.2 mg/dL (ref 0.3–1.2)
Total Protein: 7.5 g/dL (ref 6.5–8.1)

## 2020-06-03 LAB — BRAIN NATRIURETIC PEPTIDE: B Natriuretic Peptide: 287 pg/mL — ABNORMAL HIGH (ref 0.0–100.0)

## 2020-06-03 MED ORDER — ALBUMIN HUMAN 25 % IV SOLN
25.0000 g | Freq: Once | INTRAVENOUS | Status: AC
  Administered 2020-06-03: 25 g via INTRAVENOUS
  Filled 2020-06-03: qty 100

## 2020-06-03 MED ORDER — ENOXAPARIN SODIUM 60 MG/0.6ML ~~LOC~~ SOLN
50.0000 mg | SUBCUTANEOUS | Status: DC
  Administered 2020-06-03 – 2020-06-05 (×3): 50 mg via SUBCUTANEOUS
  Filled 2020-06-03 (×3): qty 0.6

## 2020-06-03 NOTE — Plan of Care (Signed)
  Problem: Acute Rehab PT Goals(only PT should resolve) Goal: Pt Will Go Supine/Side To Sit Outcome: Progressing Flowsheets (Taken 06/03/2020 1147) Pt will go Supine/Side to Sit:  with modified independence  Independently Goal: Patient Will Transfer Sit To/From Stand Outcome: Progressing Flowsheets (Taken 06/03/2020 1147) Patient will transfer sit to/from stand:  with modified independence  Independently Goal: Pt Will Transfer Bed To Chair/Chair To Bed Outcome: Progressing Flowsheets (Taken 06/03/2020 1147) Pt will Transfer Bed to Chair/Chair to Bed:  with modified independence  Independently Goal: Pt Will Ambulate Outcome: Progressing Flowsheets (Taken 06/03/2020 1147) Pt will Ambulate:  > 125 feet  with modified independence   11:47 AM, 06/03/20 Lonell Grandchild, MPT Physical Therapist with [facility] [phone] office [phone] mobile phone

## 2020-06-03 NOTE — TOC Progression Note (Addendum)
Transition of Care [facility]) - Progression Note    Patient Details  Name: William Barrett MRN: [id number] Date of Birth: 1960-04-28  Transition of Care [facility]) CM/SW Contact  Salome Arnt, [place] Phone Number: 06/03/2020, 10:48 AM  Clinical Narrative:   Discussed HHRN with pt who is agreeable. No preference on agency. [facility] and accepted. Needs HHRN order. Pt updated. TOC will follow.     Expected Discharge Plan: Home/Self Care Barriers to Discharge: Continued Medical Work up  Expected Discharge Plan and Services Expected Discharge Plan: Home/Self Care In-house Referral: Clinical Social Work     Living arrangements for the past 2 months: Single Family Home                 DME Arranged: N/A DME Agency: NA                   Social Determinants of Health (SDOH) Interventions    Readmission Risk Interventions No flowsheet data found.

## 2020-06-03 NOTE — Evaluation (Signed)
Physical Therapy Evaluation Patient Details Name: William Barrett MRN: [id number] DOB: [birth year] Today's Date: 06/03/2020   History of Present Illness  William Barrett is a 60 y.o. male with a history of hypertension, chronic alcohol use, alcoholic cirrhosis.  Patient was at a prescription for Lasix a few weeks ago due to lower extremity swelling.  He uses for 2 to 3 weeks, then stopped because he ran out of his medications.  Since then, he has had increasing shortness of breath, increasing edema in his lower extremities.  No palliating or provoking factors, although lower extremity edema did improve with the Lasix.  The patient became so short of breath today that he called EMS.  He was noted to be mildly hypoxic.  They started him on CPAP in route to the hospital.  By the time they reached the hospital, his breathing had improved that they switched him to a nonrebreather.  He is a chronic alcohol drinker and reports drinking a couple of 40s a day.  He reports not missing any days.  Denies fevers, chills, nausea, vomiting.    Clinical Impression  Patient functioning near baseline for functional mobility and gait demonstrating slightly unsteady cadence without loss of balance without use of an AD, did not have to lean on nearby objects for support, on room air with SpO2 dropping from 90% to 84%, put back on 3 LPM O2 to walk back to room with SpO2 at 89%.  Patient tolerated sitting up in chair after therapy - RN notified.  Patient will benefit from continued physical therapy in hospital and recommended venue below to increase strength, balance, endurance for safe ADLs and gait.      Follow Up Recommendations No PT follow up    Equipment Recommendations  None recommended by PT    Recommendations for Other Services       Precautions / Restrictions Precautions Precautions: None Restrictions Weight Bearing Restrictions: No      Mobility  Bed Mobility Overal bed mobility: Modified  Independent                  Transfers Overall transfer level: Modified independent                  Ambulation/Gait Ambulation/Gait assistance: Modified independent (Device/Increase time);Supervision Gait Distance (Feet): 100 Feet Assistive device: None Gait Pattern/deviations: Decreased step length - right;Decreased step length - left;Decreased stride length;Wide base of support Gait velocity: decreased   General Gait Details: slightly labored cadence with wide base of support, no loss of balance, on room air with SpO2 dropping from 90% to 84%, put back on 3 LPM with SpO2 at 89% walking back to room  Stairs            Wheelchair Mobility    Modified Rankin (Stroke Patients Only)       Balance Overall balance assessment: Mild deficits observed, not formally tested                                           Pertinent Vitals/Pain Pain Assessment: No/denies pain    Home Living Family/patient expects to be discharged to:: Private residence Living Arrangements: Other relatives Available Help at Discharge: Family Type of Home: Mobile home Home Access: Stairs to enter Entrance Stairs-Rails: Right Entrance Stairs-Number of Steps: 2-3 Home Layout: One level Home Equipment: Walker - 2 wheels  Prior Function Level of Independence: Independent         Comments: Hydrographic surveyor, does not drive     Hand Dominance   Dominant Hand: Right    Extremity/Trunk Assessment   Upper Extremity Assessment Upper Extremity Assessment: Defer to OT evaluation    Lower Extremity Assessment Lower Extremity Assessment: Overall WFL for tasks assessed    Cervical / Trunk Assessment Cervical / Trunk Assessment: Normal  Communication   Communication: No difficulties  Cognition Arousal/Alertness: Awake/alert Behavior During Therapy: WFL for tasks assessed/performed Overall Cognitive Status: Within Functional Limits for tasks assessed                                         General Comments      Exercises     Assessment/Plan    PT Assessment Patient needs continued PT services  PT Problem List Decreased strength;Decreased activity tolerance;Decreased balance;Decreased mobility       PT Treatment Interventions Gait training;Stair training;Functional mobility training;Therapeutic activities;Therapeutic exercise;Patient/family education;Balance training;DME instruction    PT Goals (Current goals can be found in the Care Plan section)  Acute Rehab PT Goals Patient Stated Goal: return home with family to assist PT Goal Formulation: With patient Time For Goal Achievement: 06/06/20 Potential to Achieve Goals: Good    Frequency Min 2X/week   Barriers to discharge        Co-evaluation               AM-PAC PT "6 Clicks" Mobility  Outcome Measure Help needed turning from your back to your side while in a flat bed without using bedrails?: None Help needed moving from lying on your back to sitting on the side of a flat bed without using bedrails?: None Help needed moving to and from a bed to a chair (including a wheelchair)?: None Help needed standing up from a chair using your arms (e.g., wheelchair or bedside chair)?: None Help needed to walk in hospital room?: A Little Help needed climbing 3-5 steps with a railing? : A Little 6 Click Score: 22    End of Session Equipment Utilized During Treatment: Oxygen Activity Tolerance: Patient tolerated treatment well;Patient limited by fatigue Patient left: in chair;with call bell/phone within reach Nurse Communication: Mobility status PT Visit Diagnosis: Unsteadiness on feet (R26.81);Other abnormalities of gait and mobility (R26.89);Muscle weakness (generalized) (M62.81)    Time: 3244-0102 PT Time Calculation (min) (ACUTE ONLY): 20 min   Charges:   PT Evaluation $PT Eval Moderate Complexity: 1 Mod PT Treatments $Therapeutic Activity: 8-22  mins        11:46 AM, 06/03/20 Lonell Grandchild, MPT Physical Therapist with [facility] 336 [phone] office [phone] mobile phone

## 2020-06-03 NOTE — Progress Notes (Signed)
Patient Demographics:    William Barrett, is a 60 y.o. male, DOB - 02/15/1961, YIF:[id number]  Admit date - 05/26/2020   Admitting Physician Truett Mainland, DO  Outpatient Primary MD for the patient is [name], MD  LOS - 8  Chief Complaint  Patient presents with  . Shortness of Breath        Subjective:    William Barrett was seen and examined this morning, in mild to moderate shortness of breath, worse with exertion.  Patient has not ambulated yet still in bed, requiring 5-4 L of oxygen currently satting 92%  I's and O's >> negative 3200, 700 this morning    Assessment  & Plan :    Principal Problem:   Acute on chronic heart failure with preserved ejection fraction (HFpEF)/Acute on Chronic Diastolic HF Active Problems:   Chronic alcohol abuse   Hyponatremia   Alcoholic Cirrhosis ([place])   Acute respiratory failure with hypoxia (HCC)   Essential hypertension  Brief Summary:- 60 y.o. male with a history of hypertension, chronic alcohol use, alcoholic cirrhosis, heart failure with preserved EF  admitted on 05/26/2020 with worsening shortness of breath and acute hypoxic respiratory failure after running out of diuretics. -Now with diarrhea and CT findings of colitis, C. difficile testing pending -Patient is confirmed that he is not O2 dependent at home now requiring up to 6 L of oxygen on this admission  ------------------------------------------------------------------------------------------------------------------------------------------------------------------   Assessment plan:  1)HFpEF--acute on chronic diastolic dysfunction CHF- - PTA patient was noncompliant with diuretic -Patient admits to  high salt intake and drinks lots of beer PTA -Dyspnea and hypoxia persist--improving -Continue IV Lasix given--- increasing to 40 mg twice daily --- we will continue for  today  -Diuresing well -Repleting albumin and underdosed today -Repeat chest x-ray on 05/30/2020 with CHF findings -Monitoring daily weight I's and O's -Chest x-ray and BNP consistent with CHF exacerbation  Intake/Output Summary (Last 24 hours) at 06/03/2020 1147 Last data filed at 06/03/2020 1000 Gross per 24 hour  Intake 2510 ml  Output 4650 ml  Net -2140 ml      -Echo with EF over 75%, hyperdynamic left ventricle noted severe LVH, no regional wall motion abnormalities -Fluid balance is negative and weight is trending down with diuresis  Acute colitis with colonic ileus-- - abdominal x-rays with transverse colonic dilatation,  -CT abdomen and pelvis with colitis extending from the hepatic flexure to the mid transverse colon -Per ID physician okay to stop Unasyn which Rocephin/azithromycin for colitis ?  Aspiration pneumonia  -Patient with diarrhea prior to any antibiotic therapy -Discussed with on-call ID physician Dr. Delfina Redwood--- C. Difficile was negative -  GI pathogen persistent diarrhea and finding on CT of colitis -Diarrhea-improving  Alcoholic liver disease/liver cirrhosis -- - low platelets and high MCV noted -Ammonia is 49, INR and LFTs are not elevated  Rt Sided  aspiration pneumonia- -h/o alcoholic abuse -remained stable -CT abdomen and pelvis from 05/30/2020 consistent with aspiration pneumonia Per ID physician switch to Unasyn and initiated Rocephin azithromycin per recommendation  -Patient with diarrhea prior to any antibiotic therapy -Hemodynamically stable afebrile normotensive  Hyponatremia- -improving - multifactorial, partly due to beer potomania compounded by hemodilution from CHF and liver cirrhosis -Sodium 123--134, 130,  132  --continue gentle diuresis  Acute hypoxic respiratory failure-- -not O2 dependent at home -Required up to 6 L of oxygen, down to 5 L now 4 L still satting 92% -Tapering off oxygen -goal to maintain O2 sat greater than  94%  -secondary to CHF and aspiration pneumonia above, should improve with diuresis and antibiotic therapy -Chest x-ray on 06/01/2020 -Encourage incentive spirometer, flutter valve   Alcohol withdrawal- - DTs improved clinically  -Benzos as ordered -Folic acid and thiamine as ordered -No signs of withdrawal at this time  Severe generalized weakness/debility -Consulted PT OT for evaluation recommendation -Recommending ambulating    Disposition/Need for in-Hospital Stay- patient unable to be discharged at this time due to --acute CHF exacerbation and aspiration pneumonia requiring IV diuresis and IV antibiotics, acute hypoxic respiratory failure requiring supplemental oxygen*  Status is: Inpatient  Remains inpatient appropriate because:Please see above   Disposition: The patient is from: Home              Anticipated d/c is to: Home with home health              Anticipated d/c date is: 2 days              Patient currently is not medically stable to d/c. Barriers: Not Clinically Stable-   Code Status :  -  Code Status: Full Code  Family Communication:    (patient is alert, awake and coherent)  Discussed with patient's sister ([name])  who patient lives with  Consults  : curbSide consult with general surgeon DVT Prophylaxis  :   - SCDs     Lab Results  Component Value Date   PLT 81 (L) 06/01/2020    Inpatient Medications  Scheduled Meds: . acidophilus  1 capsule Oral TID  . dextromethorphan-guaiFENesin  1 tablet Oral BID  . enoxaparin (LOVENOX) injection  50 mg Subcutaneous Q24H  . folic acid  1 mg Oral Daily  . furosemide  40 mg Intravenous Q12H  . metoprolol tartrate  12.5 mg Oral BID  . multivitamin with minerals  1 tablet Oral Daily  . nicotine  14 mg Transdermal Daily  . potassium chloride  40 mEq Oral TID  . thiamine  100 mg Oral Daily   Or  . thiamine  100 mg Intravenous Daily   Continuous Infusions: . azithromycin Stopped (06/02/20 2026)  .  cefTRIAXone (ROCEPHIN)  IV 2 g (06/02/20 1254)   PRN Meds:.acetaminophen, ipratropium-albuterol, ondansetron **OR** ondansetron (ZOFRAN) IV    Anti-infectives (From admission, onward)   Start     Dose/Rate Route Frequency Ordered Stop   05/31/20 1202  cefTRIAXone (ROCEPHIN) 2 g in sodium chloride 0.9 % 100 mL IVPB        2 g 200 mL/hr over 30 Minutes Intravenous Every 24 hours 05/31/20 1202     05/31/20 1200  cefTRIAXone (ROCEPHIN) 1 g in sodium chloride 0.9 % 100 mL IVPB  Status:  Discontinued        1 g 200 mL/hr over 30 Minutes Intravenous Every 24 hours 05/31/20 1154 05/31/20 1202   05/30/20 1730  Ampicillin-Sulbactam (UNASYN) 3 g in sodium chloride 0.9 % 100 mL IVPB  Status:  Discontinued        3 g 200 mL/hr over 30 Minutes Intravenous Every 6 hours 05/30/20 1624 05/31/20 1154   05/30/20 1700  cefTRIAXone (ROCEPHIN) 1 g in sodium chloride 0.9 % 100 mL IVPB  Status:  Discontinued  1 g 200 mL/hr over 30 Minutes Intravenous Every 24 hours 05/30/20 1616 05/30/20 1618   05/30/20 1700  azithromycin (ZITHROMAX) 500 mg in sodium chloride 0.9 % 250 mL IVPB        500 mg 250 mL/hr over 60 Minutes Intravenous Every 24 hours 05/30/20 1616     05/30/20 1700  metroNIDAZOLE (FLAGYL) IVPB 500 mg  Status:  Discontinued        500 mg 100 mL/hr over 60 Minutes Intravenous Every 8 hours 05/30/20 1616 05/30/20 1618        Objective:   Vitals:   06/02/20 1947 06/02/20 2024 06/03/20 0512 06/03/20 1015  BP:  109/77 117/74 109/80  Pulse: 86 89 80 88  Resp: 20 18 18    Temp:  98 F (36.7 C) 98.1 F (36.7 C)   TempSrc:      SpO2: 95% 92% 92%   Weight:   101.8 kg   Height:        Wt Readings from Last 3 Encounters:  06/03/20 101.8 kg  04/01/19 81.6 kg  01/20/15 77.1 kg     Intake/Output Summary (Last 24 hours) at 06/03/2020 1147 Last data filed at 06/03/2020 1000 Gross per 24 hour  Intake 2510 ml  Output 4650 ml  Net -2140 ml       Physical Exam:   General:  Alert,  oriented, cooperative,SOB  HEENT:  Normocephalic, PERRL, otherwise with in Normal limits   Neuro:  CNII-XII intact. , normal motor and sensation, reflexes intact   Lungs:    Diffuse rhonchi, respirations unlabored, no wheezes, mild to moderate crackles at lower lobes  Cardio:    S1/S2, RRR, No murmure, No Rubs or Gallops   Abdomen:   Soft, non-tender, bowel sounds active all four quadrants,  no guarding or peritoneal signs.  Muscular skeletal:  Limited exam - in bed, able to move all 4 extremities, Normal strength,  2+ pulses,  symmetric, +2  pitting edema  Skin:  Dry, warm to touch, negative for any Rashes,  Wounds: Please see nursing documentation                 Data Review:   Micro Results Recent Results (from the past 240 hour(s))  Resp Panel by RT-PCR (Flu A&B, Covid) Nasopharyngeal Swab     Status: None   Collection Time: 05/26/20  4:13 PM   Specimen: Nasopharyngeal Swab; Nasopharyngeal(NP) swabs in vial transport medium  Result Value Ref Range Status   SARS Coronavirus 2 by RT PCR NEGATIVE NEGATIVE Final    Comment: (NOTE) SARS-CoV-2 target nucleic acids are NOT DETECTED.  The SARS-CoV-2 RNA is generally detectable in upper respiratory specimens during the acute phase of infection. The lowest concentration of SARS-CoV-2 viral copies this assay can detect is 138 copies/mL. A negative result does not preclude SARS-Cov-2 infection and should not be used as the sole basis for treatment or other patient management decisions. A negative result may occur with  improper specimen collection/handling, submission of specimen other than nasopharyngeal swab, presence of viral mutation(s) within the areas targeted by this assay, and inadequate number of viral copies(<138 copies/mL). A negative result must be combined with clinical observations, patient history, and epidemiological information. The expected result is Negative.  Fact Sheet for Patients:   EntrepreneurPulse.com.au  Fact Sheet for Healthcare Providers:  IncredibleEmployment.be  This test is no t yet approved or cleared by the [place] FDA and  has been authorized for detection and/or diagnosis of SARS-CoV-2 by FDA  under an Emergency Use Authorization (EUA). This EUA will remain  in effect (meaning this test can be used) for the duration of the COVID-19 declaration under Section 564(b)(1) of the Act, 21 U.S.C.section 360bbb-3(b)(1), unless the authorization is terminated  or revoked sooner.       Influenza A by PCR NEGATIVE NEGATIVE Final   Influenza B by PCR NEGATIVE NEGATIVE Final    Comment: (NOTE) The Xpert Xpress SARS-CoV-2/FLU/RSV plus assay is intended as an aid in the diagnosis of influenza from Nasopharyngeal swab specimens and should not be used as a sole basis for treatment. Nasal washings and aspirates are unacceptable for Xpert Xpress SARS-CoV-2/FLU/RSV testing.  Fact Sheet for Patients: EntrepreneurPulse.com.au  Fact Sheet for Healthcare Providers: IncredibleEmployment.be  This test is not yet approved or cleared by the [place] FDA and has been authorized for detection and/or diagnosis of SARS-CoV-2 by FDA under an Emergency Use Authorization (EUA). This EUA will remain in effect (meaning this test can be used) for the duration of the COVID-19 declaration under Section 564(b)(1) of the Act, 21 U.S.C. section 360bbb-3(b)(1), unless the authorization is terminated or revoked.  Performed at [facility], [address] [place], [place] [address]   C Difficile Quick Screen (NO PCR Reflex)     Status: None   Collection Time: 05/31/20  4:30 PM   Specimen: STOOL  Result Value Ref Range Status   C Diff antigen NEGATIVE NEGATIVE Final   C Diff toxin NEGATIVE NEGATIVE Final   C Diff interpretation No C. difficile detected.  Final    Comment: Performed at [facility], [address], [place], [place] [address]  Gastrointestinal Panel by PCR , Stool     Status: None   Collection Time: 05/31/20  4:30 PM   Specimen: STOOL  Result Value Ref Range Status   Campylobacter species NOT DETECTED NOT DETECTED Final   Plesimonas shigelloides NOT DETECTED NOT DETECTED Final   Salmonella species NOT DETECTED NOT DETECTED Final   Yersinia enterocolitica NOT DETECTED NOT DETECTED Final   Vibrio species NOT DETECTED NOT DETECTED Final   Vibrio cholerae NOT DETECTED NOT DETECTED Final   Enteroaggregative E coli (EAEC) NOT DETECTED NOT DETECTED Final   Enteropathogenic E coli (EPEC) NOT DETECTED NOT DETECTED Final   Enterotoxigenic E coli (ETEC) NOT DETECTED NOT DETECTED Final   Shiga like toxin producing E coli (STEC) NOT DETECTED NOT DETECTED Final   Shigella/Enteroinvasive E coli (EIEC) NOT DETECTED NOT DETECTED Final   Cryptosporidium NOT DETECTED NOT DETECTED Final   Cyclospora cayetanensis NOT DETECTED NOT DETECTED Final   Entamoeba histolytica NOT DETECTED NOT DETECTED Final   Giardia lamblia NOT DETECTED NOT DETECTED Final   Adenovirus F40/41 NOT DETECTED NOT DETECTED Final   Astrovirus NOT DETECTED NOT DETECTED Final   Norovirus GI/GII NOT DETECTED NOT DETECTED Final   Rotavirus A NOT DETECTED NOT DETECTED Final   Sapovirus (I, II, IV, and V) NOT DETECTED NOT DETECTED Final    Comment: Performed at [facility], [address], [place], [place] [address]    Radiology Reports DG Chest 2 View  Result Date: 06/01/2020 CLINICAL DATA:  60 year old male with shortness of breath. Negative for COVID-19. 6 days ago. EXAM: CHEST - 2 VIEW COMPARISON:  Chest radiographs 05/28/2020 and earlier. FINDINGS: Upright AP and lateral views of the chest today. Large lung volumes with increased AP dimension to the chest. Stable cardiomegaly and mediastinal contours. Visualized tracheal air column is within normal limits. Pulmonary vascularity and/or  interstitial  markings appear stable over this series of exams, although increased since 2013. No pneumothorax. No pleural effusion or consolidation. Paucity of bowel gas in the upper abdomen. No acute osseous abnormality identified. IMPRESSION: Chronic cardiomegaly with pulmonary hyperinflation and nonspecific interstitial markings increased since 2013. Cannot exclude viral/atypical respiratory infection, or mild interstitial edema. Otherwise, these could be chronic interstitial lung changes. Electronically Signed   By: Genevie Ann M.D.   On: 06/01/2020 12:01   CT ABDOMEN PELVIS W CONTRAST  Result Date: 05/30/2020 CLINICAL DATA:  Bowel obstruction, constipation, bloating, generalized abdominal pain EXAM: CT ABDOMEN AND PELVIS WITH CONTRAST TECHNIQUE: Multidetector CT imaging of the abdomen and pelvis was performed using the standard protocol following bolus administration of intravenous contrast. Sagittal and coronal MPR images reconstructed from axial data set. CONTRAST:  162mL OMNIPAQUE IOHEXOL 300 MG/ML SOLN IV. Water-soluble rectal contrast administered. COMPARISON:  06/17/2012 FINDINGS: Lower chest: Patchy infiltrates RIGHT lower lobe. Minimal subsegmental atelectasis LEFT lower lobe. Hepatobiliary: Irregular nodular hepatic contours consistent with cirrhosis. Gallbladder contracted. No discrete hepatic mass. Enlargement of lateral segment LEFT lobe. Pancreas: Normal appearance Spleen: Vague area of hypoechogenicity 7 mm diameter, nonspecific Adrenals/Urinary Tract: Slightly malrotated kidneys with hilum directed anteriorly. Adrenal glands, kidneys, ureters, and bladder otherwise normal appearance Stomach/Bowel: Normal appendix. Segmental wall thickening of the colon from hepatic flexure to mid transverse colon consistent with colitis. Remainder of colon normal appearance. No colonic mass or stricture. Stomach and small bowel loops unremarkable. Vascular/Lymphatic: Atherosclerotic calcifications aorta and iliac arteries  without aneurysm. Vascular structures patent. No adenopathy. Collateral identified from LEFT portal vein to anterior abdominal wall and into the LEFT hypogastric vein. Reproductive: Unremarkable prostate gland and seminal vesicles Other: Scattered subcutaneous edema. No free air or free fluid. Small LEFT inguinal hernia containing fat. Musculoskeletal: Advanced degenerative disc disease changes at L3-L4 with BILATERAL spondylolysis of L3 and grade 1 anterolisthesis. IMPRESSION: Cirrhotic liver with collateral vessel from LEFT portal vein to LEFT hypogastric vein. Segmental wall thickening of the colon from hepatic flexure to mid transverse colon consistent with colitis; differential diagnosis includes infection and inflammatory bowel disease, ischemia considered less likely due to lack of significant vascular disease changes,. Patchy infiltrates RIGHT lower lobe question pneumonia. Small LEFT inguinal hernia containing fat. BILATERAL spondylolysis L3 with grade 1 anterolisthesis at L3-L4. Aortic Atherosclerosis (ICD10-I70.0). Electronically Signed   By: Lavonia Dana M.D.   On: 05/30/2020 16:02   DG Chest Port 1 View  Result Date: 05/28/2020 CLINICAL DATA:  Shortness of breath. EXAM: PORTABLE CHEST 1 VIEW COMPARISON:  May 26, 2020. FINDINGS: Stable cardiomegaly with mild central pulmonary vascular congestion. No pneumothorax or pleural effusion is noted. No consolidative process is noted. Bony thorax is unremarkable. IMPRESSION: Stable cardiomegaly with mild central pulmonary vascular congestion. Electronically Signed   By: Marijo Conception M.D.   On: 05/28/2020 08:20   DG Chest Port 1 View  Result Date: 05/26/2020 CLINICAL DATA:  Short of breath.  Lower extremity edema EXAM: PORTABLE CHEST 1 VIEW COMPARISON:  November 11, 2011 FINDINGS: There is cardiomegaly with mild pulmonary venous hypertension. There is no appreciable edema or airspace opacity. No adenopathy. No bone lesions. IMPRESSION: Cardiomegaly with  a degree of pulmonary vascular congestion. No edema or airspace opacity. Electronically Signed   By: Lowella Grip III M.D.   On: 05/26/2020 16:14   DG ABD ACUTE 2+V W 1V CHEST  Result Date: 05/31/2020 CLINICAL DATA:  Abdominal pain and distention. EXAM: DG ABDOMEN ACUTE WITH 1 VIEW CHEST COMPARISON:  May 30, 2020. FINDINGS: There is no evidence of free intraperitoneal air. Stable air-filled transverse colon is noted. No significant small bowel dilatation is noted. No radiopaque calculi or other significant radiographic abnormality is seen. Stable cardiomegaly. Both lungs are clear. IMPRESSION: Stable air-filled transverse colon is noted which may represent ileus. Electronically Signed   By: Marijo Conception M.D.   On: 05/31/2020 08:36   DG ABD ACUTE 2+V W 1V CHEST  Result Date: 05/30/2020 CLINICAL DATA:  Pain.  Recent congestive heart failure EXAM: DG ABDOMEN ACUTE WITH 1 VIEW CHEST COMPARISON:  Chest radiograph May 28, 2020. FINDINGS: AP chest: There is no edema or airspace opacity. There is cardiomegaly with mild pulmonary venous hypertension. No adenopathy. Supine and upright abdomen: There is dilatation of transverse colon. No appreciable small bowel dilatation. No appreciable air-fluid levels. No free air. Probable small phlebolith left pelvis. IMPRESSION: Transverse colon dilatation. Question a degree of colonic ileus. Bowel obstruction not felt to be likely. No free air. Cardiomegaly with a degree of pulmonary vascular congestion noted. No edema or consolidation. Electronically Signed   By: Lowella Grip III M.D.   On: 05/30/2020 08:16   ECHOCARDIOGRAM COMPLETE  Result Date: 05/27/2020    ECHOCARDIOGRAM REPORT   Patient Name:   SHELL YANDOW Date of Exam: 05/27/2020 Medical Rec #:  [id number]         Height:       68.0 in Accession #:    [id number]        Weight:       251.8 lb Date of Birth:  08/03/1960         BSA:          2.254 m Patient Age:    66 years          BP:            126/87 mmHg Patient Gender: M                 HR:           84 bpm. Exam Location:  [facility] Procedure: 2D Echo Indications:    Congestive Heart Failure I50.9  History:        Patient has prior history of Echocardiogram examinations, most                 recent 12/30/2012. CHF; Risk Factors:Current Smoker and                 Hypertension. Chronic alcohol abuse, Acute respiratory failure                 with hypoxia.  Sonographer:    Leavy Cella RDCS (AE) Referring Phys: [place]  1. Left ventricular ejection fraction, by estimation, is >75%. The left ventricle has hyperdynamic function. The left ventricle has no regional wall motion abnormalities. There is severe left ventricular hypertrophy. Left ventricular diastolic parameters were normal.  2. Right ventricular systolic function is mildly reduced. The right ventricular size is mildly enlarged.  3. Right atrial size was mildly dilated.  4. The aortic valve is tricuspid. Aortic valve regurgitation is not visualized. Mild aortic valve sclerosis is present, with no evidence of aortic valve stenosis.  5. The inferior vena cava is normal in size with greater than 50% respiratory variability, suggesting right atrial pressure of 3 mmHg. FINDINGS  Left Ventricle: Left ventricular ejection fraction, by estimation, is >75%. The left ventricle has hyperdynamic function. The left ventricle  has no regional wall motion abnormalities. The left ventricular internal cavity size was normal in size. There is severe left ventricular hypertrophy. Left ventricular diastolic parameters were normal. Right Ventricle: The right ventricular size is mildly enlarged. Right vetricular wall thickness was not assessed. Right ventricular systolic function is mildly reduced. Left Atrium: Left atrial size was normal in size. Right Atrium: Right atrial size was mildly dilated. Pericardium: There is no evidence of pericardial effusion. Mitral Valve: The mitral valve is  abnormal. There is mild thickening of the mitral valve leaflet(s). Mild mitral annular calcification. Trivial mitral valve regurgitation. Tricuspid Valve: The tricuspid valve is normal in structure. Tricuspid valve regurgitation is mild. Aortic Valve: The aortic valve is tricuspid. Aortic valve regurgitation is not visualized. Mild aortic valve sclerosis is present, with no evidence of aortic valve stenosis. Aortic valve mean gradient measures 11.7 mmHg. Aortic valve peak gradient measures 16.8 mmHg. Aortic valve area, by VTI measures 3.11 cm. Pulmonic Valve: The pulmonic valve was normal in structure. Pulmonic valve regurgitation is not visualized. Aorta: The aortic root and ascending aorta are structurally normal, with no evidence of dilitation. Venous: The inferior vena cava is normal in size with greater than 50% respiratory variability, suggesting right atrial pressure of 3 mmHg.  LEFT VENTRICLE PLAX 2D LVIDd:         4.58 cm  Diastology LVIDs:         2.22 cm  LV e' medial:    8.38 cm/s LV PW:         1.84 cm  LV E/e' medial:  13.7 LV IVS:        1.47 cm  LV e' lateral:   10.20 cm/s LVOT diam:     2.10 cm  LV E/e' lateral: 11.3 LV SV:         134 LV SV Index:   60 LVOT Area:     3.46 cm  RIGHT VENTRICLE RV S prime:     14.10 cm/s TAPSE (M-mode): 2.8 cm LEFT ATRIUM             Index       RIGHT ATRIUM           Index LA diam:        4.90 cm 2.17 cm/m  RA Area:     20.00 cm LA Vol (A2C):   75.6 ml 33.54 ml/m RA Volume:   59.40 ml  26.36 ml/m LA Vol (A4C):   48.8 ml 21.65 ml/m LA Biplane Vol: 63.3 ml 28.09 ml/m  AORTIC VALVE AV Area (Vmax):    2.97 cm AV Area (Vmean):   2.92 cm AV Area (VTI):     3.11 cm AV Vmax:           205.00 cm/s AV Vmean:          164.333 cm/s AV VTI:            0.431 m AV Peak Grad:      16.8 mmHg AV Mean Grad:      11.7 mmHg LVOT Vmax:         176.00 cm/s LVOT Vmean:        138.667 cm/s LVOT VTI:          0.387 m LVOT/AV VTI ratio: 0.90  AORTA Ao Root diam: 3.40 cm MITRAL VALVE                 TRICUSPID VALVE MV Area (PHT): 2.99 cm     TR  Peak grad:   29.4 mmHg MV Decel Time: 254 msec     TR Vmax:        271.00 cm/s MR Peak grad: 62.1 mmHg MR Vmax:      394.00 cm/s   SHUNTS MV E velocity: 115.00 cm/s  Systemic VTI:  0.39 m MV A velocity: 86.70 cm/s   Systemic Diam: 2.10 cm MV E/A ratio:  1.33 Dorris Carnes MD Electronically signed by Dorris Carnes MD Signature Date/Time: 05/27/2020/6:36:32 PM    Final      CBC Recent Labs  Lab 05/29/20 0545 05/31/20 0606 06/01/20 0526  WBC 6.3 5.3 5.5  HGB 14.8 15.2 14.6  HCT 46.3 48.5 46.2  PLT 81* 91* 81*  MCV 115.8* 116.3* 117.9*  MCH 37.0* 36.5* 37.2*  MCHC 32.0 31.3 31.6  RDW 14.9 14.4 14.1    Chemistries  Recent Labs  Lab 05/28/20 0535 05/29/20 0545 05/30/20 0540 05/31/20 0606 06/01/20 0526 06/03/20 0609  NA 130*  131* 132*  132* 131* 134* 130* 132*  K 4.1  4.2 3.2*  3.2* 3.4* 4.3 4.5 4.3  CL 82*  82* 80*  81* 82* 87* 87* 87*  CO2 37*  38* 40*  39* 38* 40* 37* 35*  GLUCOSE 101*  101* 92  93 101* 99 94 86  BUN 11  10 12  12 13 12 12 11   CREATININE 0.75  0.73 0.65  0.66 0.72 0.63 0.60* 0.57*  CALCIUM 8.1*  8.1* 8.1*  8.0* 8.0* 8.2* 8.1* 8.6*  MG 1.7  --   --  2.0  --   --   AST  --   --   --   --  30 33  ALT  --   --   --   --  28 32  ALKPHOS  --   --   --   --  58 57  BILITOT  --   --   --   --  1.2 1.2    Coagulation profile No results for input(s): INR, PROTIME in the last 168 hours. ------------------------------------------------------------------------------------------------------------------    Component Value Date/Time   BNP 287.0 (H) 06/03/2020 3817      SIGNED: Deatra James, MD, FHM. [facility],  Pager (please use [email or web address] to page/text)  Please use Epic Secure Chat for non-urgent communication (7AM-7PM) If 7PM-7AM, please contact night-coverage [email or web address],  06/03/2020, 11:47 AM

## 2020-06-03 NOTE — Evaluation (Signed)
Occupational Therapy Evaluation Patient Details Name: William Barrett MRN: [id number] DOB: 1960/10/24 Today's Date: 06/03/2020    History of Present Illness William Barrett is a 60 y.o. male with a history of hypertension, chronic alcohol use, alcoholic cirrhosis.  Patient was at a prescription for Lasix a few weeks ago due to lower extremity swelling.  He uses for 2 to 3 weeks, then stopped because he ran out of his medications.  Since then, he has had increasing shortness of breath, increasing edema in his lower extremities.  No palliating or provoking factors, although lower extremity edema did improve with the Lasix.  The patient became so short of breath today that he called EMS.  He was noted to be mildly hypoxic.  They started him on CPAP in route to the hospital.  By the time they reached the hospital, his breathing had improved that they switched him to a nonrebreather.  He is a chronic alcohol drinker and reports drinking a couple of 40s a day.  He reports not missing any days.  Denies fevers, chills, nausea, vomiting.   Clinical Impression   Pt agreeable to OT evaluation. Pt donned 2.5 to 3 L O2 via nasal cannula for majority of session. SpO2 levels around 93% when on O2 following functional ambulation to sink with Mod I using RW while washing hands. Pt able to march in place standing with RW for ~1 minute SpO2 levels dropped to a low of 84% but rose to 93% when seated in chair and donning nasal cannula again. When seated pt was able to doff and don R sock with Mod I. Pt is not recommended for further acute OT services due to being at a level of Mod I for ADL tasks assessed. Discussed SpO2 levels with Physical Therapy. PT reported that staff was being notified of possible O2 needs at home.      Follow Up Recommendations  No OT follow up    Equipment Recommendations  None recommended by OT           Precautions / Restrictions Precautions Precautions: None Restrictions Weight  Bearing Restrictions: No      Mobility Bed Mobility Overal bed mobility: Modified Independent                  Transfers Overall transfer level: Modified independent                    Balance Overall balance assessment: Mild deficits observed, not formally tested                                         ADL either performed or assessed with clinical judgement   ADL Overall ADL's : Modified independent                                       General ADL Comments: Used RW for ambulation to sink to stand and wash hands. Able to don and doff R sock with Mod I.     Vision Baseline Vision/History: Wears glasses Wears Glasses: At all times                  Pertinent Vitals/Pain Pain Assessment: No/denies pain     Hand Dominance Right   Extremity/Trunk Assessment Upper Extremity Assessment Upper Extremity  Assessment: Overall WFL for tasks assessed   Lower Extremity Assessment Lower Extremity Assessment: Defer to PT evaluation   Cervical / Trunk Assessment Cervical / Trunk Assessment: Normal   Communication Communication Communication: No difficulties   Cognition Arousal/Alertness: Awake/alert Behavior During Therapy: WFL for tasks assessed/performed Overall Cognitive Status: Within Functional Limits for tasks assessed                                                      Home Living Family/patient expects to be discharged to:: Private residence Living Arrangements: Other relatives Available Help at Discharge: Family Type of Home: Mobile home Home Access: Stairs to enter Entrance Stairs-Number of Steps: 2-3 Entrance Stairs-Rails: Left Home Layout: One level     Bathroom Shower/Tub: Teacher, early years/pre: Standard Bathroom Accessibility: Yes   Home Equipment: Environmental consultant - 2 wheels;Bedside commode;Grab bars - tub/shower          Prior Functioning/Environment Level of  Independence: Independent        Comments: Hydrographic surveyor, does not drive                      OT Goals(Current goals can be found in the care plan section) Acute Rehab OT Goals Patient Stated Goal: return home with family to assist  OT Frequency:                       End of Session Equipment Utilized During Treatment: Rolling walker;Oxygen (2.5 to 3 L O2 nasal cannula)  Activity Tolerance: Patient tolerated treatment well Patient left: in chair;with chair alarm set;with call bell/phone within reach  OT Visit Diagnosis: Unsteadiness on feet (R26.81)                Time: 4239-5320 OT Time Calculation (min): 18 min Charges:  OT General Charges $OT Visit: 1 Visit OT Evaluation $OT Eval Low Complexity: 1 Low  Yarima Penman OT, MOT   Larey Seat 06/03/2020, 11:52 AM

## 2020-06-04 DIAGNOSIS — I5033 Acute on chronic diastolic (congestive) heart failure: Secondary | ICD-10-CM | POA: Diagnosis not present

## 2020-06-04 LAB — COMPREHENSIVE METABOLIC PANEL
ALT: 42 U/L (ref 0–44)
AST: 44 U/L — ABNORMAL HIGH (ref 15–41)
Albumin: 3.5 g/dL (ref 3.5–5.0)
Alkaline Phosphatase: 58 U/L (ref 38–126)
Anion gap: 11 (ref 5–15)
BUN: 12 mg/dL (ref 6–20)
CO2: 31 mmol/L (ref 22–32)
Calcium: 8.5 mg/dL — ABNORMAL LOW (ref 8.9–10.3)
Chloride: 90 mmol/L — ABNORMAL LOW (ref 98–111)
Creatinine, Ser: 0.62 mg/dL (ref 0.61–1.24)
GFR, Estimated: >60 mL/min (ref >60)
Glucose, Bld: 94 mg/dL (ref 70–99)
Potassium: 4.3 mmol/L (ref 3.5–5.1)
Sodium: 132 mmol/L — ABNORMAL LOW (ref 135–145)
Total Bilirubin: 1 mg/dL (ref 0.3–1.2)
Total Protein: 7.3 g/dL (ref 6.5–8.1)

## 2020-06-04 LAB — BRAIN NATRIURETIC PEPTIDE: B Natriuretic Peptide: 152 pg/mL — ABNORMAL HIGH (ref 0.0–100.0)

## 2020-06-04 MED ORDER — AZITHROMYCIN 250 MG PO TABS
500.0000 mg | ORAL_TABLET | Freq: Every day | ORAL | Status: AC
  Administered 2020-06-04 – 2020-06-05 (×2): 500 mg via ORAL
  Filled 2020-06-04 (×2): qty 2

## 2020-06-04 MED ORDER — SODIUM CHLORIDE 0.9 % IV SOLN: 500.0000 mg | INTRAVENOUS | Status: DC

## 2020-06-04 NOTE — Progress Notes (Signed)
Physical Therapy Treatment Patient Details Name: William Barrett MRN: [id number] DOB: 1960-03-18 Today's Date: 06/04/2020    History of Present Illness William Barrett is a 60 y.o. male with a history of hypertension, chronic alcohol use, alcoholic cirrhosis.  Patient was at a prescription for Lasix a few weeks ago due to lower extremity swelling.  He uses for 2 to 3 weeks, then stopped because he ran out of his medications.  Since then, he has had increasing shortness of breath, increasing edema in his lower extremities.  No palliating or provoking factors, although lower extremity edema did improve with the Lasix.  The patient became so short of breath today that he called EMS.  He was noted to be mildly hypoxic.  They started him on CPAP in route to the hospital.  By the time they reached the hospital, his breathing had improved that they switched him to a nonrebreather.  He is a chronic alcohol drinker and reports drinking a couple of 40s a day.  He reports not missing any days.  Denies fevers, chills, nausea, vomiting.    PT Comments    Pt tolerates BLE strengthening exercises without complaints, on RA with SpO2 94%. Pt ambulates around room and in hallway pushing IV pole, no unsteadiness or LOB with direction changes, turns or navigating past obstacles. Pt on RA with SpO2 varying 93-87% with ambulation, upon return to room pt returns to sitting, removes face mask and cued for pursed lip breathing with SpO2 improvement to 90% within 1 minute. Returned supplemental O2 at [facility] and notified nurse of SpO2 while on RA with mobility. Pt is modified independent with all mobility, has RW at home if needed, has progressed to meet acute care PT goals and all education complete- PT will sign off at this time. Please re-consult if needs arise.   Follow Up Recommendations  No PT follow up     Equipment Recommendations  None recommended by PT    Recommendations for Other Services       Precautions  / Restrictions Precautions Precautions: None Restrictions Weight Bearing Restrictions: No    Mobility  Bed Mobility  General bed mobility comments: in chair upon arrival    Transfers Overall transfer level: Modified independent Equipment used: None   Ambulation/Gait Ambulation/Gait assistance: Modified independent (Device/Increase time) Gait Distance (Feet): 150 Feet Assistive device: IV Pole Gait Pattern/deviations: Step-through pattern;Decreased stride length;Wide base of support Gait velocity: slightly decreased   General Gait Details: pt ambulates around room and in hallway pushing IV pole, maintains wide BOS without LOB or unsteadiness, pt on RA with SpO2 93-87% while ambulating, with seated rest break and cues for pursed lip breathing improves to 90% within 1 minute, pt denies SOB and no labored breathind noted   Stairs             Wheelchair Mobility    Modified Rankin (Stroke Patients Only)       Balance Overall balance assessment: No apparent balance deficits (not formally assessed)        Cognition Arousal/Alertness: Awake/alert Behavior During Therapy: WFL for tasks assessed/performed Overall Cognitive Status: Within Functional Limits for tasks assessed       Exercises General Exercises - Lower Extremity Long Arc Quad: Seated;AROM;Strengthening;Both;20 reps Hip Flexion/Marching: Seated;AROM;Strengthening;Both;20 reps Other Exercises Other Exercises: STS, 10 reps from chair    General Comments        Pertinent Vitals/Pain Pain Assessment: No/denies pain    Home Living  Prior Function            PT Goals (current goals can now be found in the care plan section) Acute Rehab PT Goals Patient Stated Goal: return home with family to assist PT Goal Formulation: With patient Time For Goal Achievement: 06/06/20 Potential to Achieve Goals: Good Progress towards PT goals: Progressing toward goals     Frequency    Min 2X/week      PT Plan Current plan remains appropriate    Co-evaluation              AM-PAC PT "6 Clicks" Mobility   Outcome Measure  Help needed turning from your back to your side while in a flat bed without using bedrails?: None Help needed moving from lying on your back to sitting on the side of a flat bed without using bedrails?: None Help needed moving to and from a bed to a chair (including a wheelchair)?: None Help needed standing up from a chair using your arms (e.g., wheelchair or bedside chair)?: None Help needed to walk in hospital room?: None Help needed climbing 3-5 steps with a railing? : None 6 Click Score: 24    End of Session   Activity Tolerance: Patient tolerated treatment well Patient left: in chair;with call bell/phone within reach;with chair alarm set Nurse Communication: Mobility status;Other (comment) (SpO2) PT Visit Diagnosis: Unsteadiness on feet (R26.81);Other abnormalities of gait and mobility (R26.89);Muscle weakness (generalized) (M62.81)     Time: 0321-2248 PT Time Calculation (min) (ACUTE ONLY): 22 min  Charges:  $Gait Training: 8-22 mins                      Tori Broussard PT, DPT 06/04/20, 2:30 PM

## 2020-06-04 NOTE — Progress Notes (Signed)
Patient Demographics:    William Barrett, is a 60 y.o. male, DOB - 1960-10-19, EEF:[id number]  Admit date - 05/26/2020   Admitting Physician Truett Mainland, DO  Outpatient Primary MD for the patient is [name], MD  LOS - 9  Chief Complaint  Patient presents with  . Shortness of Breath        Subjective:    William Barrett seen and examined this morning. Reporting he is doing well, he has ambulated more yesterday and earlier this morning. His O2 demand has improved from 6-5 now after 3 L of oxygen by nasal cannula  I's and O's >> negative 2600, 1220   Assessment  & Plan :    Principal Problem:   Acute on chronic heart failure with preserved ejection fraction (HFpEF)/Acute on Chronic Diastolic HF Active Problems:   Chronic alcohol abuse   Hyponatremia   Alcoholic Cirrhosis ([place])   Acute respiratory failure with hypoxia (H[facility])   Essential hypertension  Brief Summary:- 60 y.o. male with a history of hypertension, chronic alcohol use, alcoholic cirrhosis, heart failure with preserved EF  admitted on 05/26/2020 with worsening shortness of breath and acute hypoxic respiratory failure after running out of diuretics. -Now with diarrhea and CT findings of colitis, C. difficile testing pending -Patient is confirmed that he is not O2 dependent at home now requiring up to 6 L of oxygen on this admission  ---------------------------------------------------------------------------------------------------------------------------------------------   Assessment plan:  1)HFpEF--acute on chronic diastolic dysfunction CHF- - PTA patient was noncompliant with diureti, admitted to high salt intake, drinking beer at home  -Dyspnea and hypoxia persist--improving -Continue IV Lasix given--- increasing to 40 mg twice daily --- we will continue for 1 more day Into switching to p.o. Lasix or torsemide in  1-2 days  -Diuresing well -Status post albumin replacement -Repeat chest x-ray on 05/30/2020 with CHF findings -Monitoring daily weight I's and O's -Chest x-ray and BNP consistent with CHF exacerbation   Intake/Output Summary (Last 24 hours) at 06/04/2020 1518 Last data filed at 06/04/2020 1322 Gross per 24 hour  Intake 1080 ml  Output 4300 ml  Net -3220 ml      -Echo with EF over 75%, hyperdynamic left ventricle noted severe LVH, no regional wall motion abnormalities -Fluid balance is negative and weight is trending down with diuresis  Acute colitis with colonic ileus-- - abdominal x-rays with transverse colonic dilatation,  -CT abdomen and pelvis with colitis extending from the hepatic flexure to the mid transverse colon -Per ID physician okay to stop Unasyn which Rocephin/azithromycin for colitis ?  Aspiration pneumonia Antibiotics to end 06/05/2020 -concluding 7 days of treatment  -Patient with diarrhea prior to any antibiotic therapy -Discussed with on-call ID physician Dr. Delfina Redwood--- C. Difficile was negative -  GI pathogen persistent diarrhea and finding on CT of colitis -Diarrhea -much improved  Alcoholic liver disease/liver cirrhosis -- - low platelets and high MCV noted -Ammonia is 49, INR and LFTs are not elevated  Rt Sided  aspiration pneumonia- -h/o alcoholic abuse -r remained stable -CT abdomen and pelvis from 05/30/2020 consistent with aspiration pneumonia Per ID physician switch to Unasyn and initiated Rocephin azithromycin per recommendation     Hyponatremia- -improving - multifactorial, partly due to beer potomania compounded  by hemodilution from CHF and liver cirrhosis -Sodium 123--134, 130, 132  --continue gentle diuresis  Acute hypoxic respiratory failure-- -not O2 dependent at home -Required up to 6 L of oxygen, then 5 then for now on 3 L of oxygen satting 95% -Tapering off oxygen -goal to maintain O2 sat greater than 94% -Likely will need  home O2 upon discharge  -secondary to CHF and aspiration pneumonia above, should improve with diuresis and antibiotic therapy -Chest x-ray on 06/01/2020 -Encourage incentive spirometer, flutter valve   Alcohol withdrawal- - DTs improved clinically  -As needed benzodiazepines -Folic acid and thiamine as ordered -No signs of withdrawal at this time  Severe generalized weakness/debility -Consulted PT OT for evaluation recommendation -Recommending ambulating    Disposition/Need for in-Hospital Stay-patient remained in acute respiratory failure due to CHF exacerbation, pneumonia requiring IV diuretics, antibiotics, requiring high flow oxygen   Status is: Inpatient Remains inpatient appropriate because:Please see above   Disposition: The patient is from: Home              Anticipated d/c is to: Home with home health              Anticipated d/c date is: 2 days              Patient currently is not medically stable to d/c.   Code Status :  -  Code Status: Full Code  Family Communication: None, discussed with patient   patient's sister ([name])  who patient lives with  Consults  : curbSide consult with general surgeon DVT Prophylaxis  :   - SCDs     Lab Results  Component Value Date   PLT 81 (L) 06/01/2020    Inpatient Medications  Scheduled Meds: . acidophilus  1 capsule Oral TID  . dextromethorphan-guaiFENesin  1 tablet Oral BID  . enoxaparin (LOVENOX) injection  50 mg Subcutaneous Q24H  . folic acid  1 mg Oral Daily  . furosemide  40 mg Intravenous Q12H  . metoprolol tartrate  12.5 mg Oral BID  . multivitamin with minerals  1 tablet Oral Daily  . nicotine  14 mg Transdermal Daily  . potassium chloride  40 mEq Oral TID  . thiamine  100 mg Oral Daily   Or  . thiamine  100 mg Intravenous Daily   Continuous Infusions: . azithromycin    . cefTRIAXone (ROCEPHIN)  IV 2 g (06/04/20 1122)   PRN Meds:.acetaminophen, ipratropium-albuterol, ondansetron **OR**  ondansetron (ZOFRAN) IV    Anti-infectives (From admission, onward)   Start     Dose/Rate Route Frequency Ordered Stop   06/04/20 1700  azithromycin (ZITHROMAX) 500 mg in sodium chloride 0.9 % 250 mL IVPB        500 mg 250 mL/hr over 60 Minutes Intravenous Every 24 hours 06/04/20 1101 06/05/20 1659   05/31/20 1202  cefTRIAXone (ROCEPHIN) 2 g in sodium chloride 0.9 % 100 mL IVPB        2 g 200 mL/hr over 30 Minutes Intravenous Every 24 hours 05/31/20 1202     05/31/20 1200  cefTRIAXone (ROCEPHIN) 1 g in sodium chloride 0.9 % 100 mL IVPB  Status:  Discontinued        1 g 200 mL/hr over 30 Minutes Intravenous Every 24 hours 05/31/20 1154 05/31/20 1202   05/30/20 1730  Ampicillin-Sulbactam (UNASYN) 3 g in sodium chloride 0.9 % 100 mL IVPB  Status:  Discontinued        3 g 200 mL/hr over  30 Minutes Intravenous Every 6 hours 05/30/20 1624 05/31/20 1154   05/30/20 1700  cefTRIAXone (ROCEPHIN) 1 g in sodium chloride 0.9 % 100 mL IVPB  Status:  Discontinued        1 g 200 mL/hr over 30 Minutes Intravenous Every 24 hours 05/30/20 1616 05/30/20 1618   05/30/20 1700  azithromycin (ZITHROMAX) 500 mg in sodium chloride 0.9 % 250 mL IVPB  Status:  Discontinued        500 mg 250 mL/hr over 60 Minutes Intravenous Every 24 hours 05/30/20 1616 06/04/20 1101   05/30/20 1700  metroNIDAZOLE (FLAGYL) IVPB 500 mg  Status:  Discontinued        500 mg 100 mL/hr over 60 Minutes Intravenous Every 8 hours 05/30/20 1616 05/30/20 1618        Objective:   Vitals:   06/03/20 2204 06/04/20 0607 06/04/20 0815 06/04/20 1439  BP: 107/69 (!) 88/59 134/85 123/78  Pulse: 84 81 93 84  Resp: 18 20    Temp: 98.2 F (36.8 C) 97.8 F (36.6 C)  98 F (36.7 C)  TempSrc:    Oral  SpO2: 91% 93%  95%  Weight:      Height:        Wt Readings from Last 3 Encounters:  06/03/20 101.8 kg  04/01/19 81.6 kg  01/20/15 77.1 kg     Intake/Output Summary (Last 24 hours) at 06/04/2020 1518 Last data filed at 06/04/2020  1322 Gross per 24 hour  Intake 1080 ml  Output 4300 ml  Net -3220 ml        Physical Exam:   General:  Alert, oriented, cooperative, no distress;   HEENT:  Normocephalic, PERRL, otherwise with in Normal limits   Neuro:  CNII-XII intact. , normal motor and sensation, reflexes intact   Lungs:    Lower lobe rhonchi, with subtle crackles clear to auscultation BL, Respirations unlabored, no wheezes /   Cardio:    S1/S2, RRR, No murmure, No Rubs or Gallops   Abdomen:   Soft, non-tender, bowel sounds active all four quadrants,  no guarding or peritoneal signs.  Muscular skeletal:  Limited exam - in bed, able to move all 4 extremities, Normal strength,  2+ pulses,  symmetric, +2 pitting edema  Skin:  Dry, warm to touch, negative for any Rashes,  Wounds: Please see nursing documentation                   Data Review:   Micro Results Recent Results (from the past 240 hour(s))  Resp Panel by RT-PCR (Flu A&B, Covid) Nasopharyngeal Swab     Status: None   Collection Time: 05/26/20  4:13 PM   Specimen: Nasopharyngeal Swab; Nasopharyngeal(NP) swabs in vial transport medium  Result Value Ref Range Status   SARS Coronavirus 2 by RT PCR NEGATIVE NEGATIVE Final    Comment: (NOTE) SARS-CoV-2 target nucleic acids are NOT DETECTED.  The SARS-CoV-2 RNA is generally detectable in upper respiratory specimens during the acute phase of infection. The lowest concentration of SARS-CoV-2 viral copies this assay can detect is 138 copies/mL. A negative result does not preclude SARS-Cov-2 infection and should not be used as the sole basis for treatment or other patient management decisions. A negative result may occur with  improper specimen collection/handling, submission of specimen other than nasopharyngeal swab, presence of viral mutation(s) within the areas targeted by this assay, and inadequate number of viral copies(<138 copies/mL). A negative result must be combined with clinical  observations,  patient history, and epidemiological information. The expected result is Negative.  Fact Sheet for Patients:  EntrepreneurPulse.com.au  Fact Sheet for Healthcare Providers:  IncredibleEmployment.be  This test is no t yet approved or cleared by the [place] FDA and  has been authorized for detection and/or diagnosis of SARS-CoV-2 by FDA under an Emergency Use Authorization (EUA). This EUA will remain  in effect (meaning this test can be used) for the duration of the COVID-19 declaration under Section 564(b)(1) of the Act, 21 U.S.C.section 360bbb-3(b)(1), unless the authorization is terminated  or revoked sooner.       Influenza A by PCR NEGATIVE NEGATIVE Final   Influenza B by PCR NEGATIVE NEGATIVE Final    Comment: (NOTE) The Xpert Xpress SARS-CoV-2/FLU/RSV plus assay is intended as an aid in the diagnosis of influenza from Nasopharyngeal swab specimens and should not be used as a sole basis for treatment. Nasal washings and aspirates are unacceptable for Xpert Xpress SARS-CoV-2/FLU/RSV testing.  Fact Sheet for Patients: EntrepreneurPulse.com.au  Fact Sheet for Healthcare Providers: IncredibleEmployment.be  This test is not yet approved or cleared by the [place] FDA and has been authorized for detection and/or diagnosis of SARS-CoV-2 by FDA under an Emergency Use Authorization (EUA). This EUA will remain in effect (meaning this test can be used) for the duration of the COVID-19 declaration under Section 564(b)(1) of the Act, 21 U.S.C. section 360bbb-3(b)(1), unless the authorization is terminated or revoked.  Performed at [facility], [address] [place], [place] [address]   C Difficile Quick Screen (NO PCR Reflex)     Status: None   Collection Time: 05/31/20  4:30 PM   Specimen: STOOL  Result Value Ref Range Status   C Diff antigen NEGATIVE NEGATIVE Final   C Diff toxin  NEGATIVE NEGATIVE Final   C Diff interpretation No C. difficile detected.  Final    Comment: Performed at [facility], [address] [place], [place] [address]  Gastrointestinal Panel by PCR , Stool     Status: None   Collection Time: 05/31/20  4:30 PM   Specimen: STOOL  Result Value Ref Range Status   Campylobacter species NOT DETECTED NOT DETECTED Final   Plesimonas shigelloides NOT DETECTED NOT DETECTED Final   Salmonella species NOT DETECTED NOT DETECTED Final   Yersinia enterocolitica NOT DETECTED NOT DETECTED Final   Vibrio species NOT DETECTED NOT DETECTED Final   Vibrio cholerae NOT DETECTED NOT DETECTED Final   Enteroaggregative E coli (EAEC) NOT DETECTED NOT DETECTED Final   Enteropathogenic E coli (EPEC) NOT DETECTED NOT DETECTED Final   Enterotoxigenic E coli (ETEC) NOT DETECTED NOT DETECTED Final   Shiga like toxin producing E coli (STEC) NOT DETECTED NOT DETECTED Final   Shigella/Enteroinvasive E coli (EIEC) NOT DETECTED NOT DETECTED Final   Cryptosporidium NOT DETECTED NOT DETECTED Final   Cyclospora cayetanensis NOT DETECTED NOT DETECTED Final   Entamoeba histolytica NOT DETECTED NOT DETECTED Final   Giardia lamblia NOT DETECTED NOT DETECTED Final   Adenovirus F40/41 NOT DETECTED NOT DETECTED Final   Astrovirus NOT DETECTED NOT DETECTED Final   Norovirus GI/GII NOT DETECTED NOT DETECTED Final   Rotavirus A NOT DETECTED NOT DETECTED Final   Sapovirus (I, II, IV, and V) NOT DETECTED NOT DETECTED Final    Comment: Performed at [facility], [address] [place], [place] [address]    Radiology Reports DG Chest 2 View  Result Date: 06/01/2020 CLINICAL DATA:  60 year old male with shortness of breath. Negative  for COVID-19. 6 days ago. EXAM: CHEST - 2 VIEW COMPARISON:  Chest radiographs 05/28/2020 and earlier. FINDINGS: Upright AP and lateral views of the chest today. Large lung volumes with increased AP dimension to the chest. Stable cardiomegaly and  mediastinal contours. Visualized tracheal air column is within normal limits. Pulmonary vascularity and/or interstitial markings appear stable over this series of exams, although increased since 2013. No pneumothorax. No pleural effusion or consolidation. Paucity of bowel gas in the upper abdomen. No acute osseous abnormality identified. IMPRESSION: Chronic cardiomegaly with pulmonary hyperinflation and nonspecific interstitial markings increased since 2013. Cannot exclude viral/atypical respiratory infection, or mild interstitial edema. Otherwise, these could be chronic interstitial lung changes. Electronically Signed   By: Genevie Ann M.D.   On: 06/01/2020 12:01   CT ABDOMEN PELVIS W CONTRAST  Result Date: 05/30/2020 CLINICAL DATA:  Bowel obstruction, constipation, bloating, generalized abdominal pain EXAM: CT ABDOMEN AND PELVIS WITH CONTRAST TECHNIQUE: Multidetector CT imaging of the abdomen and pelvis was performed using the standard protocol following bolus administration of intravenous contrast. Sagittal and coronal MPR images reconstructed from axial data set. CONTRAST:  134mL OMNIPAQUE IOHEXOL 300 MG/ML SOLN IV. Water-soluble rectal contrast administered. COMPARISON:  06/17/2012 FINDINGS: Lower chest: Patchy infiltrates RIGHT lower lobe. Minimal subsegmental atelectasis LEFT lower lobe. Hepatobiliary: Irregular nodular hepatic contours consistent with cirrhosis. Gallbladder contracted. No discrete hepatic mass. Enlargement of lateral segment LEFT lobe. Pancreas: Normal appearance Spleen: Vague area of hypoechogenicity 7 mm diameter, nonspecific Adrenals/Urinary Tract: Slightly malrotated kidneys with hilum directed anteriorly. Adrenal glands, kidneys, ureters, and bladder otherwise normal appearance Stomach/Bowel: Normal appendix. Segmental wall thickening of the colon from hepatic flexure to mid transverse colon consistent with colitis. Remainder of colon normal appearance. No colonic mass or stricture.  Stomach and small bowel loops unremarkable. Vascular/Lymphatic: Atherosclerotic calcifications aorta and iliac arteries without aneurysm. Vascular structures patent. No adenopathy. Collateral identified from LEFT portal vein to anterior abdominal wall and into the LEFT hypogastric vein. Reproductive: Unremarkable prostate gland and seminal vesicles Other: Scattered subcutaneous edema. No free air or free fluid. Small LEFT inguinal hernia containing fat. Musculoskeletal: Advanced degenerative disc disease changes at L3-L4 with BILATERAL spondylolysis of L3 and grade 1 anterolisthesis. IMPRESSION: Cirrhotic liver with collateral vessel from LEFT portal vein to LEFT hypogastric vein. Segmental wall thickening of the colon from hepatic flexure to mid transverse colon consistent with colitis; differential diagnosis includes infection and inflammatory bowel disease, ischemia considered less likely due to lack of significant vascular disease changes,. Patchy infiltrates RIGHT lower lobe question pneumonia. Small LEFT inguinal hernia containing fat. BILATERAL spondylolysis L3 with grade 1 anterolisthesis at L3-L4. Aortic Atherosclerosis (ICD10-I70.0). Electronically Signed   By: Lavonia Dana M.D.   On: 05/30/2020 16:02   DG Chest Port 1 View  Result Date: 05/28/2020 CLINICAL DATA:  Shortness of breath. EXAM: PORTABLE CHEST 1 VIEW COMPARISON:  May 26, 2020. FINDINGS: Stable cardiomegaly with mild central pulmonary vascular congestion. No pneumothorax or pleural effusion is noted. No consolidative process is noted. Bony thorax is unremarkable. IMPRESSION: Stable cardiomegaly with mild central pulmonary vascular congestion. Electronically Signed   By: Marijo Conception M.D.   On: 05/28/2020 08:20   DG Chest Port 1 View  Result Date: 05/26/2020 CLINICAL DATA:  Short of breath.  Lower extremity edema EXAM: PORTABLE CHEST 1 VIEW COMPARISON:  November 11, 2011 FINDINGS: There is cardiomegaly with mild pulmonary venous  hypertension. There is no appreciable edema or airspace opacity. No adenopathy. No bone lesions. IMPRESSION: Cardiomegaly with a degree  of pulmonary vascular congestion. No edema or airspace opacity. Electronically Signed   By: Lowella Grip III M.D.   On: 05/26/2020 16:14   DG ABD ACUTE 2+V W 1V CHEST  Result Date: 05/31/2020 CLINICAL DATA:  Abdominal pain and distention. EXAM: DG ABDOMEN ACUTE WITH 1 VIEW CHEST COMPARISON:  May 30, 2020. FINDINGS: There is no evidence of free intraperitoneal air. Stable air-filled transverse colon is noted. No significant small bowel dilatation is noted. No radiopaque calculi or other significant radiographic abnormality is seen. Stable cardiomegaly. Both lungs are clear. IMPRESSION: Stable air-filled transverse colon is noted which may represent ileus. Electronically Signed   By: Marijo Conception M.D.   On: 05/31/2020 08:36   DG ABD ACUTE 2+V W 1V CHEST  Result Date: 05/30/2020 CLINICAL DATA:  Pain.  Recent congestive heart failure EXAM: DG ABDOMEN ACUTE WITH 1 VIEW CHEST COMPARISON:  Chest radiograph May 28, 2020. FINDINGS: AP chest: There is no edema or airspace opacity. There is cardiomegaly with mild pulmonary venous hypertension. No adenopathy. Supine and upright abdomen: There is dilatation of transverse colon. No appreciable small bowel dilatation. No appreciable air-fluid levels. No free air. Probable small phlebolith left pelvis. IMPRESSION: Transverse colon dilatation. Question a degree of colonic ileus. Bowel obstruction not felt to be likely. No free air. Cardiomegaly with a degree of pulmonary vascular congestion noted. No edema or consolidation. Electronically Signed   By: Lowella Grip III M.D.   On: 05/30/2020 08:16   ECHOCARDIOGRAM COMPLETE  Result Date: 05/27/2020    ECHOCARDIOGRAM REPORT   Patient Name:   LINTON STOLP Date of Exam: 05/27/2020 Medical Rec #:  [id number]         Height:       68.0 in Accession #:    [id number]         Weight:       251.8 lb Date of Birth:  [birth year]         BSA:          2.254 m Patient Age:    48 years          BP:           126/87 mmHg Patient Gender: M                 HR:           84 bpm. Exam Location:  [facility] Procedure: 2D Echo Indications:    Congestive Heart Failure I50.9  History:        Patient has prior history of Echocardiogram examinations, most                 recent 12/30/2012. CHF; Risk Factors:Current Smoker and                 Hypertension. Chronic alcohol abuse, Acute respiratory failure                 with hypoxia.  Sonographer:    Leavy Cella RDCS (AE) Referring Phys: [place]  1. Left ventricular ejection fraction, by estimation, is >75%. The left ventricle has hyperdynamic function. The left ventricle has no regional wall motion abnormalities. There is severe left ventricular hypertrophy. Left ventricular diastolic parameters were normal.  2. Right ventricular systolic function is mildly reduced. The right ventricular size is mildly enlarged.  3. Right atrial size was mildly dilated.  4. The aortic valve is tricuspid. Aortic valve regurgitation is not visualized. Mild aortic valve  sclerosis is present, with no evidence of aortic valve stenosis.  5. The inferior vena cava is normal in size with greater than 50% respiratory variability, suggesting right atrial pressure of 3 mmHg. FINDINGS  Left Ventricle: Left ventricular ejection fraction, by estimation, is >75%. The left ventricle has hyperdynamic function. The left ventricle has no regional wall motion abnormalities. The left ventricular internal cavity size was normal in size. There is severe left ventricular hypertrophy. Left ventricular diastolic parameters were normal. Right Ventricle: The right ventricular size is mildly enlarged. Right vetricular wall thickness was not assessed. Right ventricular systolic function is mildly reduced. Left Atrium: Left atrial size was normal in size. Right Atrium:  Right atrial size was mildly dilated. Pericardium: There is no evidence of pericardial effusion. Mitral Valve: The mitral valve is abnormal. There is mild thickening of the mitral valve leaflet(s). Mild mitral annular calcification. Trivial mitral valve regurgitation. Tricuspid Valve: The tricuspid valve is normal in structure. Tricuspid valve regurgitation is mild. Aortic Valve: The aortic valve is tricuspid. Aortic valve regurgitation is not visualized. Mild aortic valve sclerosis is present, with no evidence of aortic valve stenosis. Aortic valve mean gradient measures 11.7 mmHg. Aortic valve peak gradient measures 16.8 mmHg. Aortic valve area, by VTI measures 3.11 cm. Pulmonic Valve: The pulmonic valve was normal in structure. Pulmonic valve regurgitation is not visualized. Aorta: The aortic root and ascending aorta are structurally normal, with no evidence of dilitation. Venous: The inferior vena cava is normal in size with greater than 50% respiratory variability, suggesting right atrial pressure of 3 mmHg.  LEFT VENTRICLE PLAX 2D LVIDd:         4.58 cm  Diastology LVIDs:         2.22 cm  LV e' medial:    8.38 cm/s LV PW:         1.84 cm  LV E/e' medial:  13.7 LV IVS:        1.47 cm  LV e' lateral:   10.20 cm/s LVOT diam:     2.10 cm  LV E/e' lateral: 11.3 LV SV:         134 LV SV Index:   60 LVOT Area:     3.46 cm  RIGHT VENTRICLE RV S prime:     14.10 cm/s TAPSE (M-mode): 2.8 cm LEFT ATRIUM             Index       RIGHT ATRIUM           Index LA diam:        4.90 cm 2.17 cm/m  RA Area:     20.00 cm LA Vol (A2C):   75.6 ml 33.54 ml/m RA Volume:   59.40 ml  26.36 ml/m LA Vol (A4C):   48.8 ml 21.65 ml/m LA Biplane Vol: 63.3 ml 28.09 ml/m  AORTIC VALVE AV Area (Vmax):    2.97 cm AV Area (Vmean):   2.92 cm AV Area (VTI):     3.11 cm AV Vmax:           205.00 cm/s AV Vmean:          164.333 cm/s AV VTI:            0.431 m AV Peak Grad:      16.8 mmHg AV Mean Grad:      11.7 mmHg LVOT Vmax:          176.00 cm/s LVOT Vmean:        138.667  cm/s LVOT VTI:          0.387 m LVOT/AV VTI ratio: 0.90  AORTA Ao Root diam: 3.40 cm MITRAL VALVE                TRICUSPID VALVE MV Area (PHT): 2.99 cm     TR Peak grad:   29.4 mmHg MV Decel Time: 254 msec     TR Vmax:        271.00 cm/s MR Peak grad: 62.1 mmHg MR Vmax:      394.00 cm/s   SHUNTS MV E velocity: 115.00 cm/s  Systemic VTI:  0.39 m MV A velocity: 86.70 cm/s   Systemic Diam: 2.10 cm MV E/A ratio:  1.33 Dorris Carnes MD Electronically signed by Dorris Carnes MD Signature Date/Time: 05/27/2020/6:36:32 PM    Final      CBC Recent Labs  Lab 05/29/20 0545 05/31/20 0606 06/01/20 0526  WBC 6.3 5.3 5.5  HGB 14.8 15.2 14.6  HCT 46.3 48.5 46.2  PLT 81* 91* 81*  MCV 115.8* 116.3* 117.9*  MCH 37.0* 36.5* 37.2*  MCHC 32.0 31.3 31.6  RDW 14.9 14.4 14.1    Chemistries  Recent Labs  Lab 05/30/20 0540 05/31/20 0606 06/01/20 0526 06/03/20 0609 06/04/20 0543  NA 131* 134* 130* 132* 132*  K 3.4* 4.3 4.5 4.3 4.3  CL 82* 87* 87* 87* 90*  CO2 38* 40* 37* 35* 31  GLUCOSE 101* 99 94 86 94  BUN 13 12 12 11 12   CREATININE 0.72 0.63 0.60* 0.57* 0.62  CALCIUM 8.0* 8.2* 8.1* 8.6* 8.5*  MG  --  2.0  --   --   --   AST  --   --  30 33 44*  ALT  --   --  28 32 42  ALKPHOS  --   --  58 57 58  BILITOT  --   --  1.2 1.2 1.0    Coagulation profile No results for input(s): INR, PROTIME in the last 168 hours. ------------------------------------------------------------------------------------------------------------------    Component Value Date/Time   BNP 152.0 (H) 06/04/2020 0543      SIGNED: Deatra James, MD, FHM. [facility],  Pager (please use [email or web address] to page/text)  Please use Epic Secure Chat for non-urgent communication (7AM-7PM) If 7PM-7AM, please contact night-coverage [email or web address],  06/04/2020, 3:18 PM

## 2020-06-05 DIAGNOSIS — I5033 Acute on chronic diastolic (congestive) heart failure: Secondary | ICD-10-CM

## 2020-06-05 DIAGNOSIS — J9601 Acute respiratory failure with hypoxia: Secondary | ICD-10-CM

## 2020-06-05 DIAGNOSIS — K639 Disease of intestine, unspecified: Secondary | ICD-10-CM

## 2020-06-05 DIAGNOSIS — R600 Localized edema: Secondary | ICD-10-CM

## 2020-06-05 DIAGNOSIS — R0689 Other abnormalities of breathing: Secondary | ICD-10-CM

## 2020-06-05 DIAGNOSIS — E871 Hypo-osmolality and hyponatremia: Secondary | ICD-10-CM

## 2020-06-05 DIAGNOSIS — R0600 Dyspnea, unspecified: Secondary | ICD-10-CM

## 2020-06-05 DIAGNOSIS — R609 Edema, unspecified: Secondary | ICD-10-CM

## 2020-06-05 DIAGNOSIS — F1010 Alcohol abuse, uncomplicated: Secondary | ICD-10-CM

## 2020-06-05 LAB — COMPREHENSIVE METABOLIC PANEL
ALT: 53 U/L — ABNORMAL HIGH (ref 0–44)
AST: 52 U/L — ABNORMAL HIGH (ref 15–41)
Albumin: 3.4 g/dL — ABNORMAL LOW (ref 3.5–5.0)
Alkaline Phosphatase: 58 U/L (ref 38–126)
Anion gap: 9 (ref 5–15)
BUN: 12 mg/dL (ref 6–20)
CO2: 33 mmol/L — ABNORMAL HIGH (ref 22–32)
Calcium: 8.7 mg/dL — ABNORMAL LOW (ref 8.9–10.3)
Chloride: 90 mmol/L — ABNORMAL LOW (ref 98–111)
Creatinine, Ser: 0.58 mg/dL — ABNORMAL LOW (ref 0.61–1.24)
GFR, Estimated: >60 mL/min (ref >60)
Glucose, Bld: 89 mg/dL (ref 70–99)
Potassium: 4.2 mmol/L (ref 3.5–5.1)
Sodium: 132 mmol/L — ABNORMAL LOW (ref 135–145)
Total Bilirubin: 0.8 mg/dL (ref 0.3–1.2)
Total Protein: 7.1 g/dL (ref 6.5–8.1)

## 2020-06-05 LAB — BRAIN NATRIURETIC PEPTIDE: B Natriuretic Peptide: 126 pg/mL — ABNORMAL HIGH (ref 0.0–100.0)

## 2020-06-05 MED ORDER — FUROSEMIDE 40 MG PO TABS
40.0000 mg | ORAL_TABLET | Freq: Every day | ORAL | Status: DC
  Administered 2020-06-06: 40 mg via ORAL
  Filled 2020-06-05: qty 1

## 2020-06-05 NOTE — Plan of Care (Signed)

## 2020-06-05 NOTE — Consult Note (Signed)
Referring Provider: [facility] Primary Care Physician:  Calla Kicks, MD Primary Gastroenterologist:  Dr. Jena Gauss  Date of Admission:  Date of Consultation:   Reason for Consultation:  Colon wall thickening  HPI:  William Barrett is a 60 y.o. male with a past medical history of tobacco abuse, hypertension, alcohol abuse with liver cirrhosis, diastolic CHF with preserved EF.  Initially presented with 1 week recurrence of dyspnea and lower extremity edema.  The patient was previously on Lasix but he quit taking it and had lower extremity swelling.  CT of the abdomen and pelvis 05/30/2020 with nodular liver and known alcoholic cirrhosis per previous GI evaluation.  Also noted segmental wall thickening of the colon from hepatic flexure to mid transverse colon.  Full CT read below.  Evaluate diarrhea but C. difficile and GI path panels were negative.  ID was consulted and empirically treated with Rocephin but this was since stopped.  His stools have firmed up.  Never had abdominal pain.  Has never had a colonoscopy.  Continues to drink 3 x 40 ounce beers daily for the past 10 to 15 years.  The patient appears to have well compensated alcoholic liver cirrhosis with an approximate MELD score of 7.  Mildly ammonia but normal mental status.  GI was consulted for his colon wall thickening with a question of ischemic colitis.  CT of the abdomen and pelvis this admission found segmental wall thickening of the colon from hepatic flexure to the mid transverse colon consistent with colitis with differentials including infection and inflammatory bowel disease.  Ischemia was felt less likely due to no significant vascular disease changes.  No CBC since 06/01/2020 which at that time had a white blood cell count of 5.5, normal hemoglobin at 14.6.  CMP today was stable hyponatremia and sodium 132, creatinine normal at 0.58.  Mild transaminitis with AST/ALT of 52/53, alk phos normal at 58, total bilirubin  normal at 0.8.  BNP significantly improved at 126 (was 733 ten days ago).  I reviewed the CT images with the radiologist Dr. Tyron Russell.  No significant atherosclerotic calcifications could be identified, most vessels appear widely patent.  Very unlikely ischemic disease responsible for his colonic wall thickening.  Also felt while portal hypertensive colopathy is possible, it is not usually limited to a single segment.  The patient is somewhat of a difficult historian. Today he states he was having a lot of diarrhea but his stools are much better. Last bowel movement today, when asked if they were loose he states "they could have been." Best I can tell, more formed but possibly soft. Denies any abdominal pain, N/V. States his last ETOH was the day before coming to the hospital and he thinks he wants to stop drinking. Denies hematochezia or melena. No other GI complaints. Breathing is better.  Past Medical History:  Diagnosis Date  . Alcoholism (HCC)   . Anemia   . Cardiac murmur    Has been evaluated by cardiology with echocardiogram in 2014 without significant findings  . Hypertension   . Tachycardia, unspecified   . Thrombocytopenia (HCC)   . Undiagnosed cardiac murmurs     Past Surgical History:  Procedure Laterality Date  . ESOPHAGOGASTRODUODENOSCOPY  09/21/2008   RMR: Distal esophageal erosions consistent with erosive reflux esophagitis.Two benign-appearing prepyloric ulcers, otherwise unremarkable/small HH    Prior to Admission medications   Medication Sig Start Date End Date Taking? Authorizing Provider  furosemide (LASIX) 20 MG tablet Take 1 tablet (20 mg total)  by mouth daily. 04/02/19  Yes Horton, Mayer Masker, MD  metoprolol tartrate (LOPRESSOR) 25 MG tablet Take 1 tablet (25 mg total) by mouth 2 (two) times daily. 01/20/15  Yes Donnetta Hutching, MD    Current Facility-Administered Medications  Medication Dose Route Frequency Provider Last Rate Last Admin  . acetaminophen (TYLENOL)  tablet 650 mg  650 mg Oral Q6H PRN Adefeso, Oladapo, DO   650 mg at 05/28/20 0301  . acidophilus (RISAQUAD) capsule 1 capsule  1 capsule Oral TID Nevin Bloodgood A, MD   1 capsule at 06/05/20 1529  . cefTRIAXone (ROCEPHIN) 2 g in sodium chloride 0.9 % 100 mL IVPB  2 g Intravenous Q24H Emokpae, Courage, MD 200 mL/hr at 06/05/20 1217 2 g at 06/05/20 1217  . dextromethorphan-guaiFENesin (MUCINEX DM) 30-600 MG per 12 hr tablet 1 tablet  1 tablet Oral BID Adefeso, Oladapo, DO   1 tablet at 06/05/20 0855  . enoxaparin (LOVENOX) injection 50 mg  50 mg Subcutaneous Q24H Shahmehdi, Seyed A, MD   50 mg at 06/04/20 1953  . furosemide (LASIX) injection 40 mg  40 mg Intravenous Pablo Ledger, MD   40 mg at 06/05/20 1000  . [START ON 06/06/2020] furosemide (LASIX) tablet 40 mg  40 mg Oral Daily Tat, David, MD      . ipratropium-albuterol (DUONEB) 0.5-2.5 (3) MG/3ML nebulizer solution 3 mL  3 mL Nebulization Q4H PRN Adefeso, Oladapo, DO   3 mL at 05/28/20 1552  . metoprolol tartrate (LOPRESSOR) tablet 12.5 mg  12.5 mg Oral BID Shahmehdi, Seyed A, MD   12.5 mg at 06/05/20 0855  . multivitamin with minerals tablet 1 tablet  1 tablet Oral Daily Levie Heritage, DO   1 tablet at 06/05/20 8032  . nicotine (NICODERM CQ - dosed in mg/24 hours) patch 14 mg  14 mg Transdermal Daily Emokpae, Courage, MD   14 mg at 06/04/20 0818  . ondansetron (ZOFRAN) tablet 4 mg  4 mg Oral Q6H PRN Levie Heritage, DO       Or  . ondansetron [facility]) injection 4 mg  4 mg Intravenous Q6H PRN Levie Heritage, DO      . potassium chloride SA (KLOR-CON) CR tablet 40 mEq  40 mEq Oral TID Shon Hale, MD   40 mEq at 06/05/20 1529  . thiamine tablet 100 mg  100 mg Oral Daily Levie Heritage, DO   100 mg at 06/05/20 1616    Allergies as of 05/26/2020  . (No Known Allergies)    Family History  Problem Relation Age of Onset  . Hypertension Mother   . Hypertension Father   . Liver disease Neg Hx   . Colon cancer Neg Hx      Social History   Socioeconomic History  . Marital status: Single    Spouse name: Not on file  . Number of children: 0  . Years of education: Not on file  . Highest education level: Not on file  Occupational History  . Occupation: disability  Tobacco Use  . Smoking status: Current Every Day Smoker    Packs/day: 3.00    Years: 25.00    Pack years: 75.00    Types: Cigarettes, Cigars  . Smokeless tobacco: Never Used  Substance and Sexual Activity  . Alcohol use: Yes    Comment: 2 40oz beers daily   . Drug use: No  . Sexual activity: Not Currently  Other Topics Concern  . Not on file  Social History  Narrative  . Not on file   Social Determinants of Health   Financial Resource Strain: Not on file  Food Insecurity: Not on file  Transportation Needs: Not on file  Physical Activity: Not on file  Stress: Not on file  Social Connections: Not on file  Intimate Partner Violence: Not on file    Review of Systems: General: Negative for anorexia, weight loss, fever, chills, fatigue, weakness. ENT: Negative for hoarseness, difficulty swallowing. CV: Negative for chest pain, angina, palpitations, peripheral edema.  Respiratory: Negative for dyspnea at rest, cough, sputum, wheezing.  GI: See history of present illness. MS: Negative for joint pain, low back pain. Endo: Negative for unusual weight change.  Heme: Negative for bruising or bleeding. Allergy: Negative for rash or hives.  Physical Exam: Vital signs in last 24 hours: Temp:  [97.5 F (36.4 C)-98.3 F (36.8 C)] 98.3 F (36.8 C) (03/23 1437) Pulse Rate:  [88-94] 89 (03/23 1437) Resp:  [19-20] 19 (03/23 1437) BP: (135-156)/(83-98) 135/83 (03/23 1437) SpO2:  [94 %-96 %] 94 % (03/23 1437) Weight:  [102 kg] 102 kg (03/23 0505) Last BM Date: 06/03/20 General:   Alert,  Well-developed, well-nourished, pleasant and cooperative in NAD Head:  Normocephalic and atraumatic. Eyes:  Sclera clear, no icterus. Conjunctiva  pink. Ears:  Normal auditory acuity. Lungs:  Clear throughout to auscultation. No wheezes, crackles, or rhonchi. No acute distress. Heart:  Regular rate and rhythm; no murmurs, clicks, rubs, or gallops. Abdomen:  Soft, nontender and nondistended. No masses, hepatosplenomegaly or hernias noted. Normal bowel sounds, without guarding, and without rebound.   Rectal:  Deferred.   Msk:  Symmetrical without gross deformities. Pulses:  Normal bilateral DP pulses noted. Extremities:  Without clubbing or edema. Neurologic:  Alert and  oriented x4;  grossly normal neurologically. Psych:  Alert and cooperative. Normal mood and affect.  Intake/Output from previous day: 03/22 0701 - 03/23 0700 In: 0254 [P.O.:1195] Out: 4300 [Urine:4300] Intake/Output this shift: Total I/O In: 680 [P.O.:680] Out: 1600 [Urine:1600]  Lab Results: No results for input(s): WBC, HGB, HCT, PLT in the last 72 hours. BMET Recent Labs    06/03/20 0609 06/04/20 0543 06/05/20 0633  NA 132* 132* 132*  K 4.3 4.3 4.2  CL 87* 90* 90*  CO2 35* 31 33*  GLUCOSE 86 94 89  BUN _0 CREATININE 0.57* 0.62 0.58*  CALCIUM 8.6* 8.5* 8.7*   LFT Recent Labs    06/03/20 0609 06/04/20 0543 06/05/20 0633  PROT 7.5 7.3 7.1  ALBUMIN 3.4* 3.5 3.4*  AST 33 44* 52*  ALT 32 42 53*  ALKPHOS 57 58 58  BILITOT 1.2 1.0 0.8   PT/INR No results for input(s): LABPROT, INR in the last 72 hours. Hepatitis Panel No results for input(s): HEPBSAG, HCVAB, HEPAIGM, HEPBIGM in the last 72 hours. C-Diff No results for input(s): CDIFFTOX in the last 72 hours.  Studies/Results: No results found.  Impression: 60 year old male who presented for dyspnea and admitted for acute on chronic diastolic CHF, lobular pneumonia, acute respiratory failure with hypoxia.  Known history of alcoholic cirrhosis with poor GI outpatient follow-up.  Apparent compensated liver disease at this time given his labs and meld score.  Thrombocytopenia secondary  to cirrhosis.  Hyponatremia secondary to CHF and cirrhosis.  Overall he seems to be clinically improved globally.  We are consulted for colopathy.  Noted wall thickening from hepatic flexure to the mid transverse colon.  There is question of possible ischemic colitis.  Portal hypertensive  colopathy in the setting of known portal hypertension remains possible as well.  He was initially having diarrhea and ID was consulted and he was empirically treated with Rocephin.  GI pathogen panel and C. difficile were both negative and antibiotics appear to have been stopped this time.  CT indicates low likelihood of ischemia given no vascular changes noted, which was confirmed when I reviewed the CT images with the radiologist as per above.  Differentials remain infectious versus inflammatory.  Fortunately he is not having significant symptoms, he is globally improved.  Stools seem to be solidifying after antibiotics.  I feel most likely he has infectious colitis, inflammatory bowel disease is not out of the differentials.  Much less likely ischemic disease or portal hypertensive colopathy given the distribution and lack of vascular disease.  He will absolutely benefit from a colonoscopy, although I feel this can be completed as an outpatient.  Cirrhosis: Generally appears to be well compensated disease with a meld score of 7.  Mild bump in transaminases in the 50s, alk phos and bilirubin normal.  He does have portal hypertension as evidenced by thrombocytopenia.  He is due for endoscopy for variceal screening, which can be accomplished as an outpatient.  He expressed an interest in alcohol cessation and I discussed this with him and encouraged him to do so.  Plan: 1. Monitor for the development of any abdominal pain 2. Monitor for any overt GI bleeding 3. Follow LFTs 4. Encourage alcohol cessation 5. He will need outpatient follow-up at discharge for alcoholic cirrhosis and colon wall thickening 6. Will benefit  from colonoscopy, most likely as outpatient 7. Supportive measures   Thank you for allowing [place] to participate in the care of William Latus, DNP, AGNP-C Adult & Gerontological Nurse Practitioner [facility]   LOS: 10 days     06/05/2020, 4:09 PM

## 2020-06-05 NOTE — Progress Notes (Signed)
PROGRESS NOTE  KACE HARTJE XNT:[id number] DOB: 30-Nov-1960 DOA: 05/26/2020 PCP: [name], MD  Brief History:  60 year old male with a history of tobacco abuse, hypertension, alcohol abuse with liver cirrhosis, and diastolic CHF presenting with at least 1 week history of increasing shortness of breath and increasing lower extremity edema and increasing abdominal girth.  The patient had been on Lasix previously, but could not tell me how long ago.  When he ran out of the Lasix, he quit taking it altogether.  He has been off of furosemide for several weeks now.  The patient was started on intravenous furosemide with good clinical improvement.  Echocardiogram showed EF >75% with normal diastolic function.  CT of the abdomen and pelvis was performed on 05/30/2020 which showed a patchy infiltrate in the right lower lobe with subsegmental atelectasis in the left lower lobe.  There was a nodular liver.  It was also noted that he had segmental wall thickening of the colon from the hepatic flexure to the mid transverse colon.  The patient did have loose stools at that time.  His C. difficile assay as well as his GI pathogen panel were both negative.  He was treated empirically with ceftriaxone.  Overall, he gradually improved and his stools firmed up.  Notably, the patient never had any abdominal pain.  He states that he has never had a colonoscopy.  In addition, the patient has at least a 40-pack-year history of tobacco.  He had continued to drink at least 3 x 40 ounce beers daily for the past 10 to 15 years.  GI was consulted to assist with management.  Assessment/Plan: Acute on chronic diastolic CHF -NEG 25 L for the admission -He is approaching euvolemia -Continue IV furosemide through today -05/27/2020 echo EF >75%, mild decreased RV, trivial MR  Decompensated alcohol liver cirrhosis -Patient improved with furosemide IV -Low-sodium diet -Ammonia level 49, but mentation is at  baseline  Lobar pneumonia -There was concern for aspiration pneumonitis -Finished 7 days of antibiotics on 06/06/2020  Acute respiratory failure with hypoxia -Multifactorial including pneumonia, pulmonary edema in the setting of underlying COPD -Initially on 6 L -Currently stable on 2 L nasal cannula  Portal hypertensive colopathy -Wall thickening noted on CT on 05/30/2020 -Patient never had any abdominal pain -Certainly, the patient may have had a degree of ischemic colitis -C. difficile and GI pathogen panel were negative -GI consult  Thrombocytopenia -Secondary to liver cirrhosis -Monitor for signs of bleeding  Hyponatremia -Secondary to CHF and liver cirrhosis -Overall improved with diuresis -nadir 123   Alcohol withdrawal- - DTs improved clinically  -As needed benzodiazepines -Folic acid and thiamine as ordered -No signs of withdrawal at this time  Severe generalized weakness/debility -Consulted PT OT for evaluation recommendation -Recommending ambulating      Status is: Inpatient  Remains inpatient appropriate because:IV treatments appropriate due to intensity of illness or inability to take PO   Dispo: The patient is from: Home              Anticipated d/c is to: Home              Patient currently is not medically stable to d/c.   Difficult to place patient No        Family Communication:   Sister updated 3/23  Consultants:  GI  Code Status:  FULL  DVT Prophylaxis:   [place] Lovenox   Procedures: As Listed in Progress  Note Above  Antibiotics: Ceftriaxone 3/18>>>    Total time spent 35 minutes.  Greater than 50% spent face to face counseling and coordinating care.     Subjective: Patient denies fevers, chills, headache, chest pain, dyspnea, nausea, vomiting, diarrhea, abdominal pain, dysuria, hematuria, hematochezia, and melena.   Objective: Vitals:   06/04/20 1406 06/04/20 1439 06/04/20 2153 06/05/20 0505  BP:  123/78 (!) 151/98  (!) 156/93  Pulse:  84 88 94  Resp:   20 20  Temp:  98 F (36.7 C) (!) 97.5 F (36.4 C) 98.2 F (36.8 C)  TempSrc:  Oral Oral Oral  SpO2: 91% 95% 96% 94%  Weight:    102 kg  Height:        Intake/Output Summary (Last 24 hours) at 06/05/2020 1425 Last data filed at 06/05/2020 1300 Gross per 24 hour  Intake 1395 ml  Output 4200 ml  Net -2805 ml   Weight change:  Exam:   General:  Pt is alert, follows commands appropriately, not in acute distress  HEENT: No icterus, No thrush, No neck mass, [place]/AT  Cardiovascular: RRR, S1/S2, no rubs, no gallops  Respiratory: bibasilar rales. No wheeze  Abdomen: Soft/+BS, non tender, non distended, no guarding  Extremities: No edema, No lymphangitis, No petechiae, No rashes, no synovitis   Data Reviewed: I have personally reviewed following labs and imaging studies Basic Metabolic Panel: Recent Labs  Lab 05/31/20 0606 06/01/20 0526 06/03/20 0609 06/04/20 0543 06/05/20 0633  NA 134* 130* 132* 132* 132*  K 4.3 4.5 4.3 4.3 4.2  CL 87* 87* 87* 90* 90*  CO2 40* 37* 35* 31 33*  GLUCOSE 99 94 86 94 89  BUN 12 12 11 12 12   CREATININE 0.63 0.60* 0.57* 0.62 0.58*  CALCIUM 8.2* 8.1* 8.6* 8.5* 8.7*  MG 2.0  --   --   --   --    Liver Function Tests: Recent Labs  Lab 06/01/20 0526 06/03/20 0609 06/04/20 0543 06/05/20 0633  AST 30 33 44* 52*  ALT 28 32 42 53*  ALKPHOS 58 57 58 58  BILITOT 1.2 1.2 1.0 0.8  PROT 7.1 7.5 7.3 7.1  ALBUMIN 2.9* 3.4* 3.5 3.4*   No results for input(s): LIPASE, AMYLASE in the last 168 hours. No results for input(s): AMMONIA in the last 168 hours. Coagulation Profile: No results for input(s): INR, PROTIME in the last 168 hours. CBC: Recent Labs  Lab 05/31/20 0606 06/01/20 0526  WBC 5.3 5.5  HGB 15.2 14.6  HCT 48.5 46.2  MCV 116.3* 117.9*  PLT 91* 81*   Cardiac Enzymes: No results for input(s): CKTOTAL, CKMB, CKMBINDEX, TROPONINI in the last 168 hours. BNP: Invalid input(s):  POCBNP CBG: Recent Labs  Lab 05/31/20 0247 06/01/20 2100  GLUCAP 109* 98   HbA1C: No results for input(s): HGBA1C in the last 72 hours. Urine analysis:    Component Value Date/Time   COLORURINE YELLOW 05/28/2020 1108   APPEARANCEUR CLEAR 05/28/2020 1108   APPEARANCEUR Clear 11/10/2011 2154   LABSPEC 1.015 05/28/2020 1108   LABSPEC 1.003 11/10/2011 2154   PHURINE 6.0 05/28/2020 1108   GLUCOSEU NEGATIVE 05/28/2020 1108   GLUCOSEU Negative 11/10/2011 2154   HGBUR NEGATIVE 05/28/2020 1108   BILIRUBINUR NEGATIVE 05/28/2020 1108   BILIRUBINUR Negative 11/10/2011 2154   [place] NEGATIVE 05/28/2020 1108   PROTEINUR NEGATIVE 05/28/2020 1108   UROBILINOGEN 0.2 06/17/2012 2100   NITRITE NEGATIVE 05/28/2020 1108   LEUKOCYTESUR NEGATIVE 05/28/2020 1108   LEUKOCYTESUR Negative 11/10/2011 2154  Sepsis Labs: @LABRCNTIP (procalcitonin:4,lacticidven:4) ) Recent Results (from the past 240 hour(s))  Resp Panel by RT-PCR (Flu A&B, Covid) Nasopharyngeal Swab     Status: None   Collection Time: 05/26/20  4:13 PM   Specimen: Nasopharyngeal Swab; Nasopharyngeal(NP) swabs in vial transport medium  Result Value Ref Range Status   SARS Coronavirus 2 by RT PCR NEGATIVE NEGATIVE Final    Comment: (NOTE) SARS-CoV-2 target nucleic acids are NOT DETECTED.  The SARS-CoV-2 RNA is generally detectable in upper respiratory specimens during the acute phase of infection. The lowest concentration of SARS-CoV-2 viral copies this assay can detect is 138 copies/mL. A negative result does not preclude SARS-Cov-2 infection and should not be used as the sole basis for treatment or other patient management decisions. A negative result may occur with  improper specimen collection/handling, submission of specimen other than nasopharyngeal swab, presence of viral mutation(s) within the areas targeted by this assay, and inadequate number of viral copies(<138 copies/mL). A negative result must be combined  with clinical observations, patient history, and epidemiological information. The expected result is Negative.  Fact Sheet for Patients:  EntrepreneurPulse.com.au  Fact Sheet for Healthcare Providers:  IncredibleEmployment.be  This test is no t yet approved or cleared by the [place] FDA and  has been authorized for detection and/or diagnosis of SARS-CoV-2 by FDA under an Emergency Use Authorization (EUA). This EUA will remain  in effect (meaning this test can be used) for the duration of the COVID-19 declaration under Section 564(b)(1) of the Act, 21 U.S.C.section 360bbb-3(b)(1), unless the authorization is terminated  or revoked sooner.       Influenza A by PCR NEGATIVE NEGATIVE Final   Influenza B by PCR NEGATIVE NEGATIVE Final    Comment: (NOTE) The Xpert Xpress SARS-CoV-2/FLU/RSV plus assay is intended as an aid in the diagnosis of influenza from Nasopharyngeal swab specimens and should not be used as a sole basis for treatment. Nasal washings and aspirates are unacceptable for Xpert Xpress SARS-CoV-2/FLU/RSV testing.  Fact Sheet for Patients: EntrepreneurPulse.com.au  Fact Sheet for Healthcare Providers: IncredibleEmployment.be  This test is not yet approved or cleared by the [place] FDA and has been authorized for detection and/or diagnosis of SARS-CoV-2 by FDA under an Emergency Use Authorization (EUA). This EUA will remain in effect (meaning this test can be used) for the duration of the COVID-19 declaration under Section 564(b)(1) of the Act, 21 U.S.C. section 360bbb-3(b)(1), unless the authorization is terminated or revoked.  Performed at [facility], [address] [place], [place] [address]   C Difficile Quick Screen (NO PCR Reflex)     Status: None   Collection Time: 05/31/20  4:30 PM   Specimen: STOOL  Result Value Ref Range Status   C Diff antigen NEGATIVE NEGATIVE Final    C Diff toxin NEGATIVE NEGATIVE Final   C Diff interpretation No C. difficile detected.  Final    Comment: Performed at [facility], [address], [place],  [address]  Gastrointestinal Panel by PCR , Stool     Status: None   Collection Time: 05/31/20  4:30 PM   Specimen: STOOL  Result Value Ref Range Status   Campylobacter species NOT DETECTED NOT DETECTED Final   Plesimonas shigelloides NOT DETECTED NOT DETECTED Final   Salmonella species NOT DETECTED NOT DETECTED Final   Yersinia enterocolitica NOT DETECTED NOT DETECTED Final   Vibrio species NOT DETECTED NOT DETECTED Final   Vibrio cholerae NOT DETECTED NOT DETECTED Final   Enteroaggregative E coli (  EAEC) NOT DETECTED NOT DETECTED Final   Enteropathogenic E coli (EPEC) NOT DETECTED NOT DETECTED Final   Enterotoxigenic E coli (ETEC) NOT DETECTED NOT DETECTED Final   Shiga like toxin producing E coli (STEC) NOT DETECTED NOT DETECTED Final   Shigella/Enteroinvasive E coli (EIEC) NOT DETECTED NOT DETECTED Final   Cryptosporidium NOT DETECTED NOT DETECTED Final   Cyclospora cayetanensis NOT DETECTED NOT DETECTED Final   Entamoeba histolytica NOT DETECTED NOT DETECTED Final   Giardia lamblia NOT DETECTED NOT DETECTED Final   Adenovirus F40/41 NOT DETECTED NOT DETECTED Final   Astrovirus NOT DETECTED NOT DETECTED Final   Norovirus GI/GII NOT DETECTED NOT DETECTED Final   Rotavirus A NOT DETECTED NOT DETECTED Final   Sapovirus (I, II, IV, and V) NOT DETECTED NOT DETECTED Final    Comment: Performed at [facility], [address] [place], [place] [address]     Scheduled Meds: . acidophilus  1 capsule Oral TID  . dextromethorphan-guaiFENesin  1 tablet Oral BID  . enoxaparin (LOVENOX) injection  50 mg Subcutaneous Q24H  . furosemide  40 mg Intravenous Q12H  . metoprolol tartrate  12.5 mg Oral BID  . multivitamin with minerals  1 tablet Oral Daily  . nicotine  14 mg Transdermal Daily  . potassium chloride  40 mEq  Oral TID  . thiamine  100 mg Oral Daily   Continuous Infusions: . cefTRIAXone (ROCEPHIN)  IV 2 g (06/05/20 1217)    Procedures/Studies: DG Chest 2 View  Result Date: 06/01/2020 CLINICAL DATA:  60 year old male with shortness of breath. Negative for COVID-19. 6 days ago. EXAM: CHEST - 2 VIEW COMPARISON:  Chest radiographs 05/28/2020 and earlier. FINDINGS: Upright AP and lateral views of the chest today. Large lung volumes with increased AP dimension to the chest. Stable cardiomegaly and mediastinal contours. Visualized tracheal air column is within normal limits. Pulmonary vascularity and/or interstitial markings appear stable over this series of exams, although increased since 2013. No pneumothorax. No pleural effusion or consolidation. Paucity of bowel gas in the upper abdomen. No acute osseous abnormality identified. IMPRESSION: Chronic cardiomegaly with pulmonary hyperinflation and nonspecific interstitial markings increased since 2013. Cannot exclude viral/atypical respiratory infection, or mild interstitial edema. Otherwise, these could be chronic interstitial lung changes. Electronically Signed   By: Genevie Ann M.D.   On: 06/01/2020 12:01   CT ABDOMEN PELVIS W CONTRAST  Result Date: 05/30/2020 CLINICAL DATA:  Bowel obstruction, constipation, bloating, generalized abdominal pain EXAM: CT ABDOMEN AND PELVIS WITH CONTRAST TECHNIQUE: Multidetector CT imaging of the abdomen and pelvis was performed using the standard protocol following bolus administration of intravenous contrast. Sagittal and coronal MPR images reconstructed from axial data set. CONTRAST:  168mL OMNIPAQUE IOHEXOL 300 MG/ML SOLN IV. Water-soluble rectal contrast administered. COMPARISON:  06/17/2012 FINDINGS: Lower chest: Patchy infiltrates RIGHT lower lobe. Minimal subsegmental atelectasis LEFT lower lobe. Hepatobiliary: Irregular nodular hepatic contours consistent with cirrhosis. Gallbladder contracted. No discrete hepatic mass.  Enlargement of lateral segment LEFT lobe. Pancreas: Normal appearance Spleen: Vague area of hypoechogenicity 7 mm diameter, nonspecific Adrenals/Urinary Tract: Slightly malrotated kidneys with hilum directed anteriorly. Adrenal glands, kidneys, ureters, and bladder otherwise normal appearance Stomach/Bowel: Normal appendix. Segmental wall thickening of the colon from hepatic flexure to mid transverse colon consistent with colitis. Remainder of colon normal appearance. No colonic mass or stricture. Stomach and small bowel loops unremarkable. Vascular/Lymphatic: Atherosclerotic calcifications aorta and iliac arteries without aneurysm. Vascular structures patent. No adenopathy. Collateral identified from LEFT portal vein to anterior abdominal wall  and into the LEFT hypogastric vein. Reproductive: Unremarkable prostate gland and seminal vesicles Other: Scattered subcutaneous edema. No free air or free fluid. Small LEFT inguinal hernia containing fat. Musculoskeletal: Advanced degenerative disc disease changes at L3-L4 with BILATERAL spondylolysis of L3 and grade 1 anterolisthesis. IMPRESSION: Cirrhotic liver with collateral vessel from LEFT portal vein to LEFT hypogastric vein. Segmental wall thickening of the colon from hepatic flexure to mid transverse colon consistent with colitis; differential diagnosis includes infection and inflammatory bowel disease, ischemia considered less likely due to lack of significant vascular disease changes,. Patchy infiltrates RIGHT lower lobe question pneumonia. Small LEFT inguinal hernia containing fat. BILATERAL spondylolysis L3 with grade 1 anterolisthesis at L3-L4. Aortic Atherosclerosis (ICD10-I70.0). Electronically Signed   By: Lavonia Dana M.D.   On: 05/30/2020 16:02   DG Chest Port 1 View  Result Date: 05/28/2020 CLINICAL DATA:  Shortness of breath. EXAM: PORTABLE CHEST 1 VIEW COMPARISON:  May 26, 2020. FINDINGS: Stable cardiomegaly with mild central pulmonary vascular  congestion. No pneumothorax or pleural effusion is noted. No consolidative process is noted. Bony thorax is unremarkable. IMPRESSION: Stable cardiomegaly with mild central pulmonary vascular congestion. Electronically Signed   By: Marijo Conception M.D.   On: 05/28/2020 08:20   DG Chest Port 1 View  Result Date: 05/26/2020 CLINICAL DATA:  Short of breath.  Lower extremity edema EXAM: PORTABLE CHEST 1 VIEW COMPARISON:  November 11, 2011 FINDINGS: There is cardiomegaly with mild pulmonary venous hypertension. There is no appreciable edema or airspace opacity. No adenopathy. No bone lesions. IMPRESSION: Cardiomegaly with a degree of pulmonary vascular congestion. No edema or airspace opacity. Electronically Signed   By: Lowella Grip III M.D.   On: 05/26/2020 16:14   DG ABD ACUTE 2+V W 1V CHEST  Result Date: 05/31/2020 CLINICAL DATA:  Abdominal pain and distention. EXAM: DG ABDOMEN ACUTE WITH 1 VIEW CHEST COMPARISON:  May 30, 2020. FINDINGS: There is no evidence of free intraperitoneal air. Stable air-filled transverse colon is noted. No significant small bowel dilatation is noted. No radiopaque calculi or other significant radiographic abnormality is seen. Stable cardiomegaly. Both lungs are clear. IMPRESSION: Stable air-filled transverse colon is noted which may represent ileus. Electronically Signed   By: Marijo Conception M.D.   On: 05/31/2020 08:36   DG ABD ACUTE 2+V W 1V CHEST  Result Date: 05/30/2020 CLINICAL DATA:  Pain.  Recent congestive heart failure EXAM: DG ABDOMEN ACUTE WITH 1 VIEW CHEST COMPARISON:  Chest radiograph May 28, 2020. FINDINGS: AP chest: There is no edema or airspace opacity. There is cardiomegaly with mild pulmonary venous hypertension. No adenopathy. Supine and upright abdomen: There is dilatation of transverse colon. No appreciable small bowel dilatation. No appreciable air-fluid levels. No free air. Probable small phlebolith left pelvis. IMPRESSION: Transverse colon  dilatation. Question a degree of colonic ileus. Bowel obstruction not felt to be likely. No free air. Cardiomegaly with a degree of pulmonary vascular congestion noted. No edema or consolidation. Electronically Signed   By: Lowella Grip III M.D.   On: 05/30/2020 08:16   ECHOCARDIOGRAM COMPLETE  Result Date: 05/27/2020    ECHOCARDIOGRAM REPORT   Patient Name:   William Barrett Date of Exam: 05/27/2020 Medical Rec #:  [id number]         Height:       68.0 in Accession #:    [id number]        Weight:       251.8 lb Date of Birth:  05/15/1960  BSA:          2.254 m Patient Age:    89 years          BP:           126/87 mmHg Patient Gender: M                 HR:           84 bpm. Exam Location:  [facility] Procedure: 2D Echo Indications:    Congestive Heart Failure I50.9  History:        Patient has prior history of Echocardiogram examinations, most                 recent 12/30/2012. CHF; Risk Factors:Current Smoker and                 Hypertension. Chronic alcohol abuse, Acute respiratory failure                 with hypoxia.  Sonographer:    Leavy Cella RDCS (AE) Referring Phys: [place]  1. Left ventricular ejection fraction, by estimation, is >75%. The left ventricle has hyperdynamic function. The left ventricle has no regional wall motion abnormalities. There is severe left ventricular hypertrophy. Left ventricular diastolic parameters were normal.  2. Right ventricular systolic function is mildly reduced. The right ventricular size is mildly enlarged.  3. Right atrial size was mildly dilated.  4. The aortic valve is tricuspid. Aortic valve regurgitation is not visualized. Mild aortic valve sclerosis is present, with no evidence of aortic valve stenosis.  5. The inferior vena cava is normal in size with greater than 50% respiratory variability, suggesting right atrial pressure of 3 mmHg. FINDINGS  Left Ventricle: Left ventricular ejection fraction, by estimation, is >75%.  The left ventricle has hyperdynamic function. The left ventricle has no regional wall motion abnormalities. The left ventricular internal cavity size was normal in size. There is severe left ventricular hypertrophy. Left ventricular diastolic parameters were normal. Right Ventricle: The right ventricular size is mildly enlarged. Right vetricular wall thickness was not assessed. Right ventricular systolic function is mildly reduced. Left Atrium: Left atrial size was normal in size. Right Atrium: Right atrial size was mildly dilated. Pericardium: There is no evidence of pericardial effusion. Mitral Valve: The mitral valve is abnormal. There is mild thickening of the mitral valve leaflet(s). Mild mitral annular calcification. Trivial mitral valve regurgitation. Tricuspid Valve: The tricuspid valve is normal in structure. Tricuspid valve regurgitation is mild. Aortic Valve: The aortic valve is tricuspid. Aortic valve regurgitation is not visualized. Mild aortic valve sclerosis is present, with no evidence of aortic valve stenosis. Aortic valve mean gradient measures 11.7 mmHg. Aortic valve peak gradient measures 16.8 mmHg. Aortic valve area, by VTI measures 3.11 cm. Pulmonic Valve: The pulmonic valve was normal in structure. Pulmonic valve regurgitation is not visualized. Aorta: The aortic root and ascending aorta are structurally normal, with no evidence of dilitation. Venous: The inferior vena cava is normal in size with greater than 50% respiratory variability, suggesting right atrial pressure of 3 mmHg.  LEFT VENTRICLE PLAX 2D LVIDd:         4.58 cm  Diastology LVIDs:         2.22 cm  LV e' medial:    8.38 cm/s LV PW:         1.84 cm  LV E/e' medial:  13.7 LV IVS:        1.47 cm  LV e' lateral:   10.20 cm/s LVOT diam:     2.10 cm  LV E/e' lateral: 11.3 LV SV:         134 LV SV Index:   60 LVOT Area:     3.46 cm  RIGHT VENTRICLE RV S prime:     14.10 cm/s TAPSE (M-mode): 2.8 cm LEFT ATRIUM             Index        RIGHT ATRIUM           Index LA diam:        4.90 cm 2.17 cm/m  RA Area:     20.00 cm LA Vol (A2C):   75.6 ml 33.54 ml/m RA Volume:   59.40 ml  26.36 ml/m LA Vol (A4C):   48.8 ml 21.65 ml/m LA Biplane Vol: 63.3 ml 28.09 ml/m  AORTIC VALVE AV Area (Vmax):    2.97 cm AV Area (Vmean):   2.92 cm AV Area (VTI):     3.11 cm AV Vmax:           205.00 cm/s AV Vmean:          164.333 cm/s AV VTI:            0.431 m AV Peak Grad:      16.8 mmHg AV Mean Grad:      11.7 mmHg LVOT Vmax:         176.00 cm/s LVOT Vmean:        138.667 cm/s LVOT VTI:          0.387 m LVOT/AV VTI ratio: 0.90  AORTA Ao Root diam: 3.40 cm MITRAL VALVE                TRICUSPID VALVE MV Area (PHT): 2.99 cm     TR Peak grad:   29.4 mmHg MV Decel Time: 254 msec     TR Vmax:        271.00 cm/s MR Peak grad: 62.1 mmHg MR Vmax:      394.00 cm/s   SHUNTS MV E velocity: 115.00 cm/s  Systemic VTI:  0.39 m MV A velocity: 86.70 cm/s   Systemic Diam: 2.10 cm MV E/A ratio:  1.33 Dorris Carnes MD Electronically signed by Dorris Carnes MD Signature Date/Time: 05/27/2020/6:36:32 PM    Final     Orson Eva, DO  [facility]  If 7PM-7AM, please contact night-coverage [email or web address] Password [email or web address] 06/05/2020, 2:25 PM   LOS: 10 days

## 2020-06-06 ENCOUNTER — Telehealth: Payer: Self-pay | Admitting: Gastroenterology

## 2020-06-06 DIAGNOSIS — K7030 Alcoholic cirrhosis of liver without ascites: Secondary | ICD-10-CM

## 2020-06-06 DIAGNOSIS — K639 Disease of intestine, unspecified: Secondary | ICD-10-CM

## 2020-06-06 DIAGNOSIS — I10 Essential (primary) hypertension: Secondary | ICD-10-CM

## 2020-06-06 LAB — PROTIME-INR
INR: 1.1 (ref 0.8–1.2)
Prothrombin Time: 13.9 seconds (ref 11.4–15.2)

## 2020-06-06 LAB — COMPREHENSIVE METABOLIC PANEL
ALT: 67 U/L — ABNORMAL HIGH (ref 0–44)
AST: 64 U/L — ABNORMAL HIGH (ref 15–41)
Albumin: 3.4 g/dL — ABNORMAL LOW (ref 3.5–5.0)
Alkaline Phosphatase: 61 U/L (ref 38–126)
Anion gap: 13 (ref 5–15)
BUN: 14 mg/dL (ref 6–20)
CO2: 28 mmol/L (ref 22–32)
Calcium: 8.9 mg/dL (ref 8.9–10.3)
Chloride: 91 mmol/L — ABNORMAL LOW (ref 98–111)
Creatinine, Ser: 0.65 mg/dL (ref 0.61–1.24)
GFR, Estimated: >60 mL/min (ref >60)
Glucose, Bld: 78 mg/dL (ref 70–99)
Potassium: 4.5 mmol/L (ref 3.5–5.1)
Sodium: 132 mmol/L — ABNORMAL LOW (ref 135–145)
Total Bilirubin: 0.9 mg/dL (ref 0.3–1.2)
Total Protein: 7.3 g/dL (ref 6.5–8.1)

## 2020-06-06 LAB — CBC
HCT: 47 % (ref 39.0–52.0)
Hemoglobin: 15.7 g/dL (ref 13.0–17.0)
MCH: 37.3 pg — ABNORMAL HIGH (ref 26.0–34.0)
MCHC: 33.4 g/dL (ref 30.0–36.0)
MCV: 111.6 fL — ABNORMAL HIGH (ref 80.0–100.0)
Platelets: 100 10*3/uL — ABNORMAL LOW (ref 150–400)
RBC: 4.21 MIL/uL — ABNORMAL LOW (ref 4.22–5.81)
RDW: 13.3 % (ref 11.5–15.5)
WBC: 4.6 10*3/uL (ref 4.0–10.5)
nRBC: 0 % (ref 0.0–0.2)

## 2020-06-06 LAB — HEPATITIS A ANTIBODY, TOTAL: hep A Total Ab: NONREACTIVE

## 2020-06-06 MED ORDER — FUROSEMIDE 40 MG PO TABS: 40.0000 mg | ORAL_TABLET | Freq: Every day | ORAL | 1 refills | Status: DC

## 2020-06-06 MED ORDER — SODIUM CHLORIDE 0.9 % IV SOLN
1.0000 g | INTRAVENOUS | Status: DC
  Administered 2020-06-06: 1 g via INTRAVENOUS
  Filled 2020-06-06: qty 10

## 2020-06-06 NOTE — Telephone Encounter (Signed)
Please arrange for hospital follow up in 3-4 weeks with Randall Hiss or myself.

## 2020-06-06 NOTE — Discharge Summary (Signed)
Physician Discharge Summary  William Barrett SVX:[id number] DOB: 05/21/1960 DOA: 05/26/2020  PCP: [name], MD  Admit date: 05/26/2020 Discharge date: 06/06/2020  Admitted From: Home Disposition:  Home  Recommendations for Outpatient Follow-up:  1. Follow up with PCP in 1-2 weeks 2. Please obtain BMP/CBC in one week  Home Health: [facility] Equipment/Devices: 2L [place]  Discharge Condition: Stable CODE STATUS: FULL Diet recommendation: Heart Healthy   Brief/Interim Summary: 60 year old male with a history of tobacco abuse, hypertension, alcohol abuse with liver cirrhosis, and diastolic CHF presenting with at least 1 week history of increasing shortness of breath and increasing lower extremity edema and increasing abdominal girth.  The patient had been on Lasix previously, but could not tell me how long ago.  When he ran out of the Lasix, he quit taking it altogether.  He has been off of furosemide for several weeks now.  The patient was started on intravenous furosemide with good clinical improvement.  Echocardiogram showed EF >75% with normal diastolic function.  CT of the abdomen and pelvis was performed on 05/30/2020 which showed a patchy infiltrate in the right lower lobe with subsegmental atelectasis in the left lower lobe.  There was a nodular liver.  It was also noted that he had segmental wall thickening of the colon from the hepatic flexure to the mid transverse colon.  The patient did have loose stools at that time.  His C. difficile assay as well as his GI pathogen panel were both negative.  He was treated empirically with ceftriaxone.  Overall, he gradually improved and his stools firmed up.  Notably, the patient never had any abdominal pain.  He states that he has never had a colonoscopy.  In addition, the patient has at least a 40-pack-year history of tobacco.  He had continued to drink at least 3 x 40 ounce beers daily for the past 10 to 15 years.  GI was consulted to assist with  management.  Discharge Diagnoses:  Acute on chronic diastolic CHF -NEG 29 L for the admission -He is approaching euvolemia -Continued IV furosemide in hospital -home with furosemide 40 mg daily -05/27/2020 echo EF >75%, mild decreased RV, trivial MR  Decompensated alcohol liver cirrhosis -Patient improved with furosemide IV -Low-sodium diet -Ammonia level 49, but mentation is at baseline  Lobar pneumonia -Finished 7 days of antibiotics on 06/06/2020  Acute respiratory failure with hypoxia -Multifactorial including pneumonia, pulmonary edema in the setting of underlying COPD -Initially on 6 L -Currently stable on 2 L nasal cannula -3/24 desaturated with ambulation-->d/c home with 2L  Infectious Colitis -Wall thickening noted on CT on 05/30/2020 -Patient never had any abdominal pain -Certainly, the patient may have had a degree of ischemic colitis -C. difficile and GI pathogen panel were negative -GI consult appreciated -diarrhea improved with ceftriaxone  Thrombocytopenia -Secondary to liver cirrhosis -Monitor for signs of bleeding  Hyponatremia -Secondary to CHF and liver cirrhosis -Overall improved with diuresis -nadir 123 -Na 132 on day of d/c  Alcohol withdrawal- - DTs improved clinically -As needed benzodiazepines -Folic acid and thiamine as ordered -No signs of withdrawal at this time  Severe generalized weakness/debility -Consulted PT OT for evaluation recommendation--no followup needed -Recommending ambulating  Tobacco abuse -cessation discussed -no desire to quit  Discharge Instructions   Allergies as of 06/06/2020   No Known Allergies     Medication List    TAKE these medications   furosemide 40 MG tablet Commonly known as: LASIX Take 1 tablet (40 mg total)  by mouth daily. Start taking on: June 07, 2020 What changed:   medication strength  how much to take   metoprolol tartrate 25 MG tablet Commonly known as:  LOPRESSOR Take 1 tablet (25 mg total) by mouth 2 (two) times daily.            Durable Medical Equipment  (From admission, onward)         Start     Ordered   06/06/20 1028  For home use only DME oxygen  Once       Question Answer Comment  Length of Need 6 Months   Mode or (Route) Nasal cannula   Liters per Minute 2   Frequency Continuous (stationary and portable oxygen unit needed)   Oxygen conserving device Yes   Oxygen delivery system Gas      06/06/20 1027          Follow-up Information    [facility] Follow [facility].   Specialty: Home Health Services Why: Will contact you to schedule home health visits.              No Known Allergies  Consultations:  GI   Procedures/Studies: DG Chest 2 View  Result Date: 06/01/2020 CLINICAL DATA:  60 year old male with shortness of breath. Negative for COVID-19. 6 days ago. EXAM: CHEST - 2 VIEW COMPARISON:  Chest radiographs 05/28/2020 and earlier. FINDINGS: Upright AP and lateral views of the chest today. Large lung volumes with increased AP dimension to the chest. Stable cardiomegaly and mediastinal contours. Visualized tracheal air column is within normal limits. Pulmonary vascularity and/or interstitial markings appear stable over this series of exams, although increased since 2013. No pneumothorax. No pleural effusion or consolidation. Paucity of bowel gas in the upper abdomen. No acute osseous abnormality identified. IMPRESSION: Chronic cardiomegaly with pulmonary hyperinflation and nonspecific interstitial markings increased since 2013. Cannot exclude viral/atypical respiratory infection, or mild interstitial edema. Otherwise, these could be chronic interstitial lung changes. Electronically Signed   By: Genevie Ann M.D.   On: 06/01/2020 12:01   CT ABDOMEN PELVIS W CONTRAST  Result Date: 05/30/2020 CLINICAL DATA:  Bowel obstruction, constipation, bloating, generalized abdominal pain EXAM: CT ABDOMEN AND  PELVIS WITH CONTRAST TECHNIQUE: Multidetector CT imaging of the abdomen and pelvis was performed using the standard protocol following bolus administration of intravenous contrast. Sagittal and coronal MPR images reconstructed from axial data set. CONTRAST:  154mL OMNIPAQUE IOHEXOL 300 MG/ML SOLN IV. Water-soluble rectal contrast administered. COMPARISON:  06/17/2012 FINDINGS: Lower chest: Patchy infiltrates RIGHT lower lobe. Minimal subsegmental atelectasis LEFT lower lobe. Hepatobiliary: Irregular nodular hepatic contours consistent with cirrhosis. Gallbladder contracted. No discrete hepatic mass. Enlargement of lateral segment LEFT lobe. Pancreas: Normal appearance Spleen: Vague area of hypoechogenicity 7 mm diameter, nonspecific Adrenals/Urinary Tract: Slightly malrotated kidneys with hilum directed anteriorly. Adrenal glands, kidneys, ureters, and bladder otherwise normal appearance Stomach/Bowel: Normal appendix. Segmental wall thickening of the colon from hepatic flexure to mid transverse colon consistent with colitis. Remainder of colon normal appearance. No colonic mass or stricture. Stomach and small bowel loops unremarkable. Vascular/Lymphatic: Atherosclerotic calcifications aorta and iliac arteries without aneurysm. Vascular structures patent. No adenopathy. Collateral identified from LEFT portal vein to anterior abdominal wall and into the LEFT hypogastric vein. Reproductive: Unremarkable prostate gland and seminal vesicles Other: Scattered subcutaneous edema. No free air or free fluid. Small LEFT inguinal hernia containing fat. Musculoskeletal: Advanced degenerative disc disease changes at L3-L4 with BILATERAL spondylolysis of L3 and grade 1 anterolisthesis. IMPRESSION: Cirrhotic liver  with collateral vessel from LEFT portal vein to LEFT hypogastric vein. Segmental wall thickening of the colon from hepatic flexure to mid transverse colon consistent with colitis; differential diagnosis includes  infection and inflammatory bowel disease, ischemia considered less likely due to lack of significant vascular disease changes,. Patchy infiltrates RIGHT lower lobe question pneumonia. Small LEFT inguinal hernia containing fat. BILATERAL spondylolysis L3 with grade 1 anterolisthesis at L3-L4. Aortic Atherosclerosis (ICD10-I70.0). Electronically Signed   By: Lavonia Dana M.D.   On: 05/30/2020 16:02   DG Chest Port 1 View  Result Date: 05/28/2020 CLINICAL DATA:  Shortness of breath. EXAM: PORTABLE CHEST 1 VIEW COMPARISON:  May 26, 2020. FINDINGS: Stable cardiomegaly with mild central pulmonary vascular congestion. No pneumothorax or pleural effusion is noted. No consolidative process is noted. Bony thorax is unremarkable. IMPRESSION: Stable cardiomegaly with mild central pulmonary vascular congestion. Electronically Signed   By: Marijo Conception M.D.   On: 05/28/2020 08:20   DG Chest Port 1 View  Result Date: 05/26/2020 CLINICAL DATA:  Short of breath.  Lower extremity edema EXAM: PORTABLE CHEST 1 VIEW COMPARISON:  November 11, 2011 FINDINGS: There is cardiomegaly with mild pulmonary venous hypertension. There is no appreciable edema or airspace opacity. No adenopathy. No bone lesions. IMPRESSION: Cardiomegaly with a degree of pulmonary vascular congestion. No edema or airspace opacity. Electronically Signed   By: Lowella Grip III M.D.   On: 05/26/2020 16:14   DG ABD ACUTE 2+V W 1V CHEST  Result Date: 05/31/2020 CLINICAL DATA:  Abdominal pain and distention. EXAM: DG ABDOMEN ACUTE WITH 1 VIEW CHEST COMPARISON:  May 30, 2020. FINDINGS: There is no evidence of free intraperitoneal air. Stable air-filled transverse colon is noted. No significant small bowel dilatation is noted. No radiopaque calculi or other significant radiographic abnormality is seen. Stable cardiomegaly. Both lungs are clear. IMPRESSION: Stable air-filled transverse colon is noted which may represent ileus. Electronically Signed   By:  Marijo Conception M.D.   On: 05/31/2020 08:36   DG ABD ACUTE 2+V W 1V CHEST  Result Date: 05/30/2020 CLINICAL DATA:  Pain.  Recent congestive heart failure EXAM: DG ABDOMEN ACUTE WITH 1 VIEW CHEST COMPARISON:  Chest radiograph May 28, 2020. FINDINGS: AP chest: There is no edema or airspace opacity. There is cardiomegaly with mild pulmonary venous hypertension. No adenopathy. Supine and upright abdomen: There is dilatation of transverse colon. No appreciable small bowel dilatation. No appreciable air-fluid levels. No free air. Probable small phlebolith left pelvis. IMPRESSION: Transverse colon dilatation. Question a degree of colonic ileus. Bowel obstruction not felt to be likely. No free air. Cardiomegaly with a degree of pulmonary vascular congestion noted. No edema or consolidation. Electronically Signed   By: Lowella Grip III M.D.   On: 05/30/2020 08:16   ECHOCARDIOGRAM COMPLETE  Result Date: 05/27/2020    ECHOCARDIOGRAM REPORT   Patient Name:   KEDARIUS ALOISI Date of Exam: 05/27/2020 Medical Rec #:  [id number]         Height:       68.0 in Accession #:    [id number]        Weight:       251.8 lb Date of Birth:  [birth year]         BSA:          2.254 m Patient Age:    78 years          BP:           126/87 mmHg Patient  Gender: M                 HR:           84 bpm. Exam Location:  [facility] Procedure: 2D Echo Indications:    Congestive Heart Failure I50.9  History:        Patient has prior history of Echocardiogram examinations, most                 recent 12/30/2012. CHF; Risk Factors:Current Smoker and                 Hypertension. Chronic alcohol abuse, Acute respiratory failure                 with hypoxia.  Sonographer:    Leavy Cella RDCS (AE) Referring Phys: [place]  1. Left ventricular ejection fraction, by estimation, is >75%. The left ventricle has hyperdynamic function. The left ventricle has no regional wall motion abnormalities. There is severe left  ventricular hypertrophy. Left ventricular diastolic parameters were normal.  2. Right ventricular systolic function is mildly reduced. The right ventricular size is mildly enlarged.  3. Right atrial size was mildly dilated.  4. The aortic valve is tricuspid. Aortic valve regurgitation is not visualized. Mild aortic valve sclerosis is present, with no evidence of aortic valve stenosis.  5. The inferior vena cava is normal in size with greater than 50% respiratory variability, suggesting right atrial pressure of 3 mmHg. FINDINGS  Left Ventricle: Left ventricular ejection fraction, by estimation, is >75%. The left ventricle has hyperdynamic function. The left ventricle has no regional wall motion abnormalities. The left ventricular internal cavity size was normal in size. There is severe left ventricular hypertrophy. Left ventricular diastolic parameters were normal. Right Ventricle: The right ventricular size is mildly enlarged. Right vetricular wall thickness was not assessed. Right ventricular systolic function is mildly reduced. Left Atrium: Left atrial size was normal in size. Right Atrium: Right atrial size was mildly dilated. Pericardium: There is no evidence of pericardial effusion. Mitral Valve: The mitral valve is abnormal. There is mild thickening of the mitral valve leaflet(s). Mild mitral annular calcification. Trivial mitral valve regurgitation. Tricuspid Valve: The tricuspid valve is normal in structure. Tricuspid valve regurgitation is mild. Aortic Valve: The aortic valve is tricuspid. Aortic valve regurgitation is not visualized. Mild aortic valve sclerosis is present, with no evidence of aortic valve stenosis. Aortic valve mean gradient measures 11.7 mmHg. Aortic valve peak gradient measures 16.8 mmHg. Aortic valve area, by VTI measures 3.11 cm. Pulmonic Valve: The pulmonic valve was normal in structure. Pulmonic valve regurgitation is not visualized. Aorta: The aortic root and ascending aorta are  structurally normal, with no evidence of dilitation. Venous: The inferior vena cava is normal in size with greater than 50% respiratory variability, suggesting right atrial pressure of 3 mmHg.  LEFT VENTRICLE PLAX 2D LVIDd:         4.58 cm  Diastology LVIDs:         2.22 cm  LV e' medial:    8.38 cm/s LV PW:         1.84 cm  LV E/e' medial:  13.7 LV IVS:        1.47 cm  LV e' lateral:   10.20 cm/s LVOT diam:     2.10 cm  LV E/e' lateral: 11.3 LV SV:         134 LV SV Index:   60 LVOT Area:  3.46 cm  RIGHT VENTRICLE RV S prime:     14.10 cm/s TAPSE (M-mode): 2.8 cm LEFT ATRIUM             Index       RIGHT ATRIUM           Index LA diam:        4.90 cm 2.17 cm/m  RA Area:     20.00 cm LA Vol (A2C):   75.6 ml 33.54 ml/m RA Volume:   59.40 ml  26.36 ml/m LA Vol (A4C):   48.8 ml 21.65 ml/m LA Biplane Vol: 63.3 ml 28.09 ml/m  AORTIC VALVE AV Area (Vmax):    2.97 cm AV Area (Vmean):   2.92 cm AV Area (VTI):     3.11 cm AV Vmax:           205.00 cm/s AV Vmean:          164.333 cm/s AV VTI:            0.431 m AV Peak Grad:      16.8 mmHg AV Mean Grad:      11.7 mmHg LVOT Vmax:         176.00 cm/s LVOT Vmean:        138.667 cm/s LVOT VTI:          0.387 m LVOT/AV VTI ratio: 0.90  AORTA Ao Root diam: 3.40 cm MITRAL VALVE                TRICUSPID VALVE MV Area (PHT): 2.99 cm     TR Peak grad:   29.4 mmHg MV Decel Time: 254 msec     TR Vmax:        271.00 cm/s MR Peak grad: 62.1 mmHg MR Vmax:      394.00 cm/s   SHUNTS MV E velocity: 115.00 cm/s  Systemic VTI:  0.39 m MV A velocity: 86.70 cm/s   Systemic Diam: 2.10 cm MV E/A ratio:  1.33 Dorris Carnes MD Electronically signed by Dorris Carnes MD Signature Date/Time: 05/27/2020/6:36:32 PM    Final         Discharge Exam: Vitals:   06/06/20 0010 06/06/20 0424  BP: 135/80 137/85  Pulse: 82 87  Resp: 18 20  Temp: 98.1 F (36.7 C) (!) 97.1 F (36.2 C)  SpO2: 95% 93%   Vitals:   06/05/20 2035 06/06/20 0010 06/06/20 0424 06/06/20 0500  BP: 140/89 135/80  137/85   Pulse: 85 82 87   Resp: 19 18 20    Temp: 98.2 F (36.8 C) 98.1 F (36.7 C) (!) 97.1 F (36.2 C)   TempSrc:  Oral    SpO2: 96% 95% 93%   Weight:    102.2 kg  Height:        General: Pt is alert, awake, not in acute distress Cardiovascular: RRR, S1/S2 +, no rubs, no gallops Respiratory: fine bibasilar rales. No wheeze Abdominal: Soft, NT, ND, bowel sounds + Extremities: no edema, no cyanosis   The results of significant diagnostics from this hospitalization (including imaging, microbiology, ancillary and laboratory) are listed below for reference.    Significant Diagnostic Studies: DG Chest 2 View  Result Date: 06/01/2020 CLINICAL DATA:  60 year old male with shortness of breath. Negative for COVID-19. 6 days ago. EXAM: CHEST - 2 VIEW COMPARISON:  Chest radiographs 05/28/2020 and earlier. FINDINGS: Upright AP and lateral views of the chest today. Large lung volumes with increased AP dimension to the chest. Stable cardiomegaly and  mediastinal contours. Visualized tracheal air column is within normal limits. Pulmonary vascularity and/or interstitial markings appear stable over this series of exams, although increased since 2013. No pneumothorax. No pleural effusion or consolidation. Paucity of bowel gas in the upper abdomen. No acute osseous abnormality identified. IMPRESSION: Chronic cardiomegaly with pulmonary hyperinflation and nonspecific interstitial markings increased since 2013. Cannot exclude viral/atypical respiratory infection, or mild interstitial edema. Otherwise, these could be chronic interstitial lung changes. Electronically Signed   By: Genevie Ann M.D.   On: 06/01/2020 12:01   CT ABDOMEN PELVIS W CONTRAST  Result Date: 05/30/2020 CLINICAL DATA:  Bowel obstruction, constipation, bloating, generalized abdominal pain EXAM: CT ABDOMEN AND PELVIS WITH CONTRAST TECHNIQUE: Multidetector CT imaging of the abdomen and pelvis was performed using the standard protocol following  bolus administration of intravenous contrast. Sagittal and coronal MPR images reconstructed from axial data set. CONTRAST:  174mL OMNIPAQUE IOHEXOL 300 MG/ML SOLN IV. Water-soluble rectal contrast administered. COMPARISON:  06/17/2012 FINDINGS: Lower chest: Patchy infiltrates RIGHT lower lobe. Minimal subsegmental atelectasis LEFT lower lobe. Hepatobiliary: Irregular nodular hepatic contours consistent with cirrhosis. Gallbladder contracted. No discrete hepatic mass. Enlargement of lateral segment LEFT lobe. Pancreas: Normal appearance Spleen: Vague area of hypoechogenicity 7 mm diameter, nonspecific Adrenals/Urinary Tract: Slightly malrotated kidneys with hilum directed anteriorly. Adrenal glands, kidneys, ureters, and bladder otherwise normal appearance Stomach/Bowel: Normal appendix. Segmental wall thickening of the colon from hepatic flexure to mid transverse colon consistent with colitis. Remainder of colon normal appearance. No colonic mass or stricture. Stomach and small bowel loops unremarkable. Vascular/Lymphatic: Atherosclerotic calcifications aorta and iliac arteries without aneurysm. Vascular structures patent. No adenopathy. Collateral identified from LEFT portal vein to anterior abdominal wall and into the LEFT hypogastric vein. Reproductive: Unremarkable prostate gland and seminal vesicles Other: Scattered subcutaneous edema. No free air or free fluid. Small LEFT inguinal hernia containing fat. Musculoskeletal: Advanced degenerative disc disease changes at L3-L4 with BILATERAL spondylolysis of L3 and grade 1 anterolisthesis. IMPRESSION: Cirrhotic liver with collateral vessel from LEFT portal vein to LEFT hypogastric vein. Segmental wall thickening of the colon from hepatic flexure to mid transverse colon consistent with colitis; differential diagnosis includes infection and inflammatory bowel disease, ischemia considered less likely due to lack of significant vascular disease changes,. Patchy  infiltrates RIGHT lower lobe question pneumonia. Small LEFT inguinal hernia containing fat. BILATERAL spondylolysis L3 with grade 1 anterolisthesis at L3-L4. Aortic Atherosclerosis (ICD10-I70.0). Electronically Signed   By: Lavonia Dana M.D.   On: 05/30/2020 16:02   DG Chest Port 1 View  Result Date: 05/28/2020 CLINICAL DATA:  Shortness of breath. EXAM: PORTABLE CHEST 1 VIEW COMPARISON:  May 26, 2020. FINDINGS: Stable cardiomegaly with mild central pulmonary vascular congestion. No pneumothorax or pleural effusion is noted. No consolidative process is noted. Bony thorax is unremarkable. IMPRESSION: Stable cardiomegaly with mild central pulmonary vascular congestion. Electronically Signed   By: Marijo Conception M.D.   On: 05/28/2020 08:20   DG Chest Port 1 View  Result Date: 05/26/2020 CLINICAL DATA:  Short of breath.  Lower extremity edema EXAM: PORTABLE CHEST 1 VIEW COMPARISON:  November 11, 2011 FINDINGS: There is cardiomegaly with mild pulmonary venous hypertension. There is no appreciable edema or airspace opacity. No adenopathy. No bone lesions. IMPRESSION: Cardiomegaly with a degree of pulmonary vascular congestion. No edema or airspace opacity. Electronically Signed   By: Lowella Grip III M.D.   On: 05/26/2020 16:14   DG ABD ACUTE 2+V W 1V CHEST  Result Date: 05/31/2020 CLINICAL DATA:  Abdominal  pain and distention. EXAM: DG ABDOMEN ACUTE WITH 1 VIEW CHEST COMPARISON:  May 30, 2020. FINDINGS: There is no evidence of free intraperitoneal air. Stable air-filled transverse colon is noted. No significant small bowel dilatation is noted. No radiopaque calculi or other significant radiographic abnormality is seen. Stable cardiomegaly. Both lungs are clear. IMPRESSION: Stable air-filled transverse colon is noted which may represent ileus. Electronically Signed   By: Marijo Conception M.D.   On: 05/31/2020 08:36   DG ABD ACUTE 2+V W 1V CHEST  Result Date: 05/30/2020 CLINICAL DATA:  Pain.  Recent  congestive heart failure EXAM: DG ABDOMEN ACUTE WITH 1 VIEW CHEST COMPARISON:  Chest radiograph May 28, 2020. FINDINGS: AP chest: There is no edema or airspace opacity. There is cardiomegaly with mild pulmonary venous hypertension. No adenopathy. Supine and upright abdomen: There is dilatation of transverse colon. No appreciable small bowel dilatation. No appreciable air-fluid levels. No free air. Probable small phlebolith left pelvis. IMPRESSION: Transverse colon dilatation. Question a degree of colonic ileus. Bowel obstruction not felt to be likely. No free air. Cardiomegaly with a degree of pulmonary vascular congestion noted. No edema or consolidation. Electronically Signed   By: Lowella Grip III M.D.   On: 05/30/2020 08:16   ECHOCARDIOGRAM COMPLETE  Result Date: 05/27/2020    ECHOCARDIOGRAM REPORT   Patient Name:   William Barrett Date of Exam: 05/27/2020 Medical Rec #:  [id number]         Height:       68.0 in Accession #:    [id number]        Weight:       251.8 lb Date of Birth:  1960/11/26         BSA:          2.254 m Patient Age:    89 years          BP:           126/87 mmHg Patient Gender: M                 HR:           84 bpm. Exam Location:  [facility] Procedure: 2D Echo Indications:    Congestive Heart Failure I50.9  History:        Patient has prior history of Echocardiogram examinations, most                 recent 12/30/2012. CHF; Risk Factors:Current Smoker and                 Hypertension. Chronic alcohol abuse, Acute respiratory failure                 with hypoxia.  Sonographer:    Leavy Cella RDCS (AE) Referring Phys: [place]  1. Left ventricular ejection fraction, by estimation, is >75%. The left ventricle has hyperdynamic function. The left ventricle has no regional wall motion abnormalities. There is severe left ventricular hypertrophy. Left ventricular diastolic parameters were normal.  2. Right ventricular systolic function is mildly reduced. The  right ventricular size is mildly enlarged.  3. Right atrial size was mildly dilated.  4. The aortic valve is tricuspid. Aortic valve regurgitation is not visualized. Mild aortic valve sclerosis is present, with no evidence of aortic valve stenosis.  5. The inferior vena cava is normal in size with greater than 50% respiratory variability, suggesting right atrial pressure of 3 mmHg. FINDINGS  Left Ventricle: Left ventricular ejection  fraction, by estimation, is >75%. The left ventricle has hyperdynamic function. The left ventricle has no regional wall motion abnormalities. The left ventricular internal cavity size was normal in size. There is severe left ventricular hypertrophy. Left ventricular diastolic parameters were normal. Right Ventricle: The right ventricular size is mildly enlarged. Right vetricular wall thickness was not assessed. Right ventricular systolic function is mildly reduced. Left Atrium: Left atrial size was normal in size. Right Atrium: Right atrial size was mildly dilated. Pericardium: There is no evidence of pericardial effusion. Mitral Valve: The mitral valve is abnormal. There is mild thickening of the mitral valve leaflet(s). Mild mitral annular calcification. Trivial mitral valve regurgitation. Tricuspid Valve: The tricuspid valve is normal in structure. Tricuspid valve regurgitation is mild. Aortic Valve: The aortic valve is tricuspid. Aortic valve regurgitation is not visualized. Mild aortic valve sclerosis is present, with no evidence of aortic valve stenosis. Aortic valve mean gradient measures 11.7 mmHg. Aortic valve peak gradient measures 16.8 mmHg. Aortic valve area, by VTI measures 3.11 cm. Pulmonic Valve: The pulmonic valve was normal in structure. Pulmonic valve regurgitation is not visualized. Aorta: The aortic root and ascending aorta are structurally normal, with no evidence of dilitation. Venous: The inferior vena cava is normal in size with greater than 50% respiratory  variability, suggesting right atrial pressure of 3 mmHg.  LEFT VENTRICLE PLAX 2D LVIDd:         4.58 cm  Diastology LVIDs:         2.22 cm  LV e' medial:    8.38 cm/s LV PW:         1.84 cm  LV E/e' medial:  13.7 LV IVS:        1.47 cm  LV e' lateral:   10.20 cm/s LVOT diam:     2.10 cm  LV E/e' lateral: 11.3 LV SV:         134 LV SV Index:   60 LVOT Area:     3.46 cm  RIGHT VENTRICLE RV S prime:     14.10 cm/s TAPSE (M-mode): 2.8 cm LEFT ATRIUM             Index       RIGHT ATRIUM           Index LA diam:        4.90 cm 2.17 cm/m  RA Area:     20.00 cm LA Vol (A2C):   75.6 ml 33.54 ml/m RA Volume:   59.40 ml  26.36 ml/m LA Vol (A4C):   48.8 ml 21.65 ml/m LA Biplane Vol: 63.3 ml 28.09 ml/m  AORTIC VALVE AV Area (Vmax):    2.97 cm AV Area (Vmean):   2.92 cm AV Area (VTI):     3.11 cm AV Vmax:           205.00 cm/s AV Vmean:          164.333 cm/s AV VTI:            0.431 m AV Peak Grad:      16.8 mmHg AV Mean Grad:      11.7 mmHg LVOT Vmax:         176.00 cm/s LVOT Vmean:        138.667 cm/s LVOT VTI:          0.387 m LVOT/AV VTI ratio: 0.90  AORTA Ao Root diam: 3.40 cm MITRAL VALVE  TRICUSPID VALVE MV Area (PHT): 2.99 cm     TR Peak grad:   29.4 mmHg MV Decel Time: 254 msec     TR Vmax:        271.00 cm/s MR Peak grad: 62.1 mmHg MR Vmax:      394.00 cm/s   SHUNTS MV E velocity: 115.00 cm/s  Systemic VTI:  0.39 m MV A velocity: 86.70 cm/s   Systemic Diam: 2.10 cm MV E/A ratio:  1.33 Dorris Carnes MD Electronically signed by Dorris Carnes MD Signature Date/Time: 05/27/2020/6:36:32 PM    Final      Microbiology: Recent Results (from the past 240 hour(s))  C Difficile Quick Screen (NO PCR Reflex)     Status: None   Collection Time: 05/31/20  4:30 PM   Specimen: STOOL  Result Value Ref Range Status   C Diff antigen NEGATIVE NEGATIVE Final   C Diff toxin NEGATIVE NEGATIVE Final   C Diff interpretation No C. difficile detected.  Final    Comment: Performed at [facility], [address],  [place], [place] [address]  Gastrointestinal Panel by PCR , Stool     Status: None   Collection Time: 05/31/20  4:30 PM   Specimen: STOOL  Result Value Ref Range Status   Campylobacter species NOT DETECTED NOT DETECTED Final   Plesimonas shigelloides NOT DETECTED NOT DETECTED Final   Salmonella species NOT DETECTED NOT DETECTED Final   Yersinia enterocolitica NOT DETECTED NOT DETECTED Final   Vibrio species NOT DETECTED NOT DETECTED Final   Vibrio cholerae NOT DETECTED NOT DETECTED Final   Enteroaggregative E coli (EAEC) NOT DETECTED NOT DETECTED Final   Enteropathogenic E coli (EPEC) NOT DETECTED NOT DETECTED Final   Enterotoxigenic E coli (ETEC) NOT DETECTED NOT DETECTED Final   Shiga like toxin producing E coli (STEC) NOT DETECTED NOT DETECTED Final   Shigella/Enteroinvasive E coli (EIEC) NOT DETECTED NOT DETECTED Final   Cryptosporidium NOT DETECTED NOT DETECTED Final   Cyclospora cayetanensis NOT DETECTED NOT DETECTED Final   Entamoeba histolytica NOT DETECTED NOT DETECTED Final   Giardia lamblia NOT DETECTED NOT DETECTED Final   Adenovirus F40/41 NOT DETECTED NOT DETECTED Final   Astrovirus NOT DETECTED NOT DETECTED Final   Norovirus GI/GII NOT DETECTED NOT DETECTED Final   Rotavirus A NOT DETECTED NOT DETECTED Final   Sapovirus (I, II, IV, and V) NOT DETECTED NOT DETECTED Final    Comment: Performed at [facility], [address] [place], [place] [address]     Labs: Basic Metabolic Panel: Recent Labs  Lab 05/31/20 0606 06/01/20 0526 06/03/20 0609 06/04/20 0543 06/05/20 0633 06/06/20 0458  NA 134* 130* 132* 132* 132* 132*  K 4.3 4.5 4.3 4.3 4.2 4.5  CL 87* 87* 87* 90* 90* 91*  CO2 40* 37* 35* 31 33* 28  GLUCOSE 99 94 86 94 89 78  BUN 12 12 11 12 12 14   CREATININE 0.63 0.60* 0.57* 0.62 0.58* 0.65  CALCIUM 8.2* 8.1* 8.6* 8.5* 8.7* 8.9  MG 2.0  --   --   --   --   --    Liver Function Tests: Recent Labs  Lab 06/01/20 0526 06/03/20 0609 06/04/20 0543  06/05/20 0633 06/06/20 0458  AST 30 33 44* 52* 64*  ALT 28 32 42 53* 67*  ALKPHOS 58 57 58 58 61  BILITOT 1.2 1.2 1.0 0.8 0.9  PROT 7.1 7.5 7.3 7.1 7.3  ALBUMIN 2.9* 3.4* 3.5 3.4* 3.4*   No  results for input(s): LIPASE, AMYLASE in the last 168 hours. No results for input(s): AMMONIA in the last 168 hours. CBC: Recent Labs  Lab 05/31/20 0606 06/01/20 0526 06/06/20 0458  WBC 5.3 5.5 4.6  HGB 15.2 14.6 15.7  HCT 48.5 46.2 47.0  MCV 116.3* 117.9* 111.6*  PLT 91* 81* 100*   Cardiac Enzymes: No results for input(s): CKTOTAL, CKMB, CKMBINDEX, TROPONINI in the last 168 hours. BNP: Invalid input(s): POCBNP CBG: Recent Labs  Lab 05/31/20 0247 06/01/20 2100  GLUCAP 109* 98    Time coordinating discharge:  36 minutes  Signed:  Orson Eva, DO [facility] Pager: [phone] 06/06/2020, 10:38 AM

## 2020-06-06 NOTE — Progress Notes (Signed)
Patient discharged home with family and home oxygen , Patient verbalized understanding to call [facility] to place credit card on file and not to smoke near oxygen . Patient verbalized understanding, personal belongings sent with patient and he was transported by sisters' personal vehicle

## 2020-06-06 NOTE — Progress Notes (Signed)
   06/06/20 1142  Vitals  Pulse Rate 75  Pulse Rate Source Monitor  Resp 18  MEWS COLOR  MEWS Score Color Green  Oxygen Therapy  SpO2 91 %  O2 Device Room Air  MEWS Score  MEWS Temp 0  MEWS Systolic 0  MEWS Pulse 0  MEWS RR 0  MEWS LOC 0  MEWS Score 0

## 2020-06-06 NOTE — TOC Transition Note (Signed)
Transition of Care [facility]) - CM/SW Discharge Note   Patient Details  Name: William Barrett MRN: [id number] Date of Birth: 03-21-1960  [facility] of [facility]) CM/SW Contact:  Shade Flood, LCSW Phone Number: 06/06/2020, 11:59 AM   Clinical Narrative:     Pt stable for dc with Home O2 today per MD. Arrange O2 and updated [facility] for dc. No other TOC needs identified for DC.  Final next level of care: [place] Barriers to Discharge: Barriers Resolved   Patient Goals and CMS Choice Patient states their goals for this hospitalization and ongoing recovery are:: return home CMS Medicare.gov Compare Post Acute Care list provided to:: Patient Choice offered to / list presented to : Patient  Discharge Placement                       Discharge Plan and Services In-house Referral: Clinical Social Work   Post Acute Care Choice: Durable Medical Equipment,Home Health          DME Arranged: Oxygen DME Agency: [facility] Date DME Agency Contacted: 06/06/20   Representative spoke with at DME Agency: Barbaraann Rondo [facility] Arranged: RN [place] Agency: [place] Date [place]: 06/06/20   Representative spoke with at [place]: [place] ([place]) Interventions     Readmission Risk Interventions No flowsheet data found.

## 2020-06-06 NOTE — Discharge Instructions (Signed)
Heart Failure Action Plan A heart failure action plan helps you understand what to do when you have symptoms of heart failure. Your action plan is a color-coded plan that lists the symptoms to watch for and indicates what actions to take.  If you have symptoms in the red zone, you need medical care right away.  If you have symptoms in the yellow zone, you are having problems.  If you have symptoms in the green zone, you are doing well. Follow the plan that was created by you and your health care provider. Review your plan each time you visit your health care provider. Red zone These signs and symptoms mean you should get medical help right away:  You have trouble breathing when resting.  You have a dry cough that is getting worse.  You have swelling or pain in your legs or abdomen that is getting worse.  You suddenly gain more than 2-3 lb (0.9-1.4 kg) in 24 hours, or more than 5 lb (2.3 kg) in a week. This amount may be more or less depending on your condition.  You have trouble staying awake or you feel confused.  You have chest pain.  You do not have an appetite.  You pass out.  You have worsening sadness or depression. If you have any of these symptoms, call your local emergency services (911 in the [place] right away. Do not drive yourself to the hospital.   Yellow zone These signs and symptoms mean your condition may be getting worse and you should make some changes:  You have trouble breathing when you are active, or you need to sleep with your head raised on extra pillows to help you breathe.  You have swelling in your legs or abdomen.  You gain 2-3 lb (0.9-1.4 kg) in 24 hours, or 5 lb (2.3 kg) in a week. This amount may be more or less depending on your condition.  You get tired easily.  You have trouble sleeping.  You have a dry cough. If you have any of these symptoms:  Contact your health care provider within the next day.  Your health care provider may adjust  your medicines.   Green zone These signs mean you are doing well and can continue what you are doing:  You do not have shortness of breath.  You have very little swelling or no new swelling.  Your weight is stable (no gain or loss).  You have a normal activity level.  You do not have chest pain or any other new symptoms.   Follow these instructions at home:  Take over-the-counter and prescription medicines only as told by your health care provider.  Weigh yourself daily.  Eat a heart-healthy diet. Work with a diet and nutrition specialist (dietitian) to create an eating plan that is best for you.  Keep all follow-up visits. This is important. Where to find more information  American Heart Association: www.heart.org Summary  A heart failure action plan helps you understand what to do when you have symptoms of heart failure.  Follow the action plan that was created by you and your health care provider.  Get help right away if you have any symptoms in the red zone. This information is not intended to replace advice given to you by your health care provider. Make sure you discuss any questions you have with your health care provider. Document Revised: 10/16/2019 Document Reviewed: 10/16/2019 Elsevier Patient Education  2021 [employer].

## 2020-06-06 NOTE — Progress Notes (Signed)
SATURATION QUALIFICATIONS: (This note is used to comply with regulatory documentation for home oxygen)  Patient Saturations on Room Air at Rest = 96  Patient Saturations on Room Air while Ambulating = 84  Patient Saturations on 2 Liters of oxygen while Ambulating = 94  Please briefly explain why patient needs home oxygen: To maintain 02 sat at 90% or above during ambulation.   Orson Eva, DO

## 2020-06-06 NOTE — Care Management Important Message (Signed)
Important Message  Patient Details  Name: William Barrett MRN: [id number] Date of Birth: 09/30/1960   Medicare Important Message Given:  Yes     Tommy Medal 06/06/2020, 11:17 AM

## 2020-06-06 NOTE — Progress Notes (Signed)
Subjective:  One BM in the last 24 hours.   Objective: Vital signs in last 24 hours: Temp:  [97.1 F (36.2 C)-98.6 F (37 C)] 97.1 F (36.2 C) (03/24 0424) Pulse Rate:  [82-89] 87 (03/24 0424) Resp:  [17-20] 20 (03/24 0424) BP: (133-140)/(80-89) 137/85 (03/24 0424) SpO2:  [93 %-96 %] 93 % (03/24 0424) Weight:  [102.2 kg] 102.2 kg (03/24 0500) Last BM Date: 06/05/20 General:   Alert,  Well-developed, well-nourished, pleasant and cooperative in NAD Head:  Normocephalic and atraumatic. Eyes:  Sclera clear, no icterus.  Abdomen:  Soft, nontender and nondistended.  Normal bowel sounds, without guarding, and without rebound.   Extremities:  Without clubbing, deformity. Trace bilateral pedal edema. Neurologic:  Alert and  oriented x4;  grossly normal neurologically. Skin:  Intact without significant lesions or rashes. Psych:  Alert and cooperative. Normal mood and affect.  Intake/Output from previous day: 03/23 0701 - 03/24 0700 In: 680 [P.O.:680] Out: 5050 [Urine:5050] Intake/Output this shift: No intake/output data recorded.  Lab Results: CBC Recent Labs    06/06/20 0458  WBC 4.6  HGB 15.7  HCT 47.0  MCV 111.6*  PLT 100*   BMET Recent Labs    06/04/20 0543 06/05/20 0633 06/06/20 0458  NA 132* 132* 132*  K 4.3 4.2 4.5  CL 90* 90* 91*  CO2 31 33* 28  GLUCOSE 94 89 78  BUN 12 12 14   CREATININE 0.62 0.58* 0.65  CALCIUM 8.5* 8.7* 8.9   LFTs Recent Labs    06/04/20 0543 06/05/20 0633 06/06/20 0458  BILITOT 1.0 0.8 0.9  ALKPHOS 58 58 61  AST 44* 52* 64*  ALT 42 53* 67*  PROT 7.3 7.1 7.3  ALBUMIN 3.5 3.4* 3.4*   No results for input(s): LIPASE in the last 72 hours. PT/INR Recent Labs    06/06/20 0458  LABPROT 13.9  INR 1.1      Imaging Studies: DG Chest 2 View  Result Date: 06/01/2020 CLINICAL DATA:  60 year old male with shortness of breath. Negative for COVID-19. 6 days ago. EXAM: CHEST - 2 VIEW COMPARISON:  Chest radiographs 05/28/2020 and  earlier. FINDINGS: Upright AP and lateral views of the chest today. Large lung volumes with increased AP dimension to the chest. Stable cardiomegaly and mediastinal contours. Visualized tracheal air column is within normal limits. Pulmonary vascularity and/or interstitial markings appear stable over this series of exams, although increased since 2013. No pneumothorax. No pleural effusion or consolidation. Paucity of bowel gas in the upper abdomen. No acute osseous abnormality identified. IMPRESSION: Chronic cardiomegaly with pulmonary hyperinflation and nonspecific interstitial markings increased since 2013. Cannot exclude viral/atypical respiratory infection, or mild interstitial edema. Otherwise, these could be chronic interstitial lung changes. Electronically Signed   By: Genevie Ann M.D.   On: 06/01/2020 12:01   CT ABDOMEN PELVIS W CONTRAST  Result Date: 05/30/2020 CLINICAL DATA:  Bowel obstruction, constipation, bloating, generalized abdominal pain EXAM: CT ABDOMEN AND PELVIS WITH CONTRAST TECHNIQUE: Multidetector CT imaging of the abdomen and pelvis was performed using the standard protocol following bolus administration of intravenous contrast. Sagittal and coronal MPR images reconstructed from axial data set. CONTRAST:  12mL OMNIPAQUE IOHEXOL 300 MG/ML SOLN IV. Water-soluble rectal contrast administered. COMPARISON:  06/17/2012 FINDINGS: Lower chest: Patchy infiltrates RIGHT lower lobe. Minimal subsegmental atelectasis LEFT lower lobe. Hepatobiliary: Irregular nodular hepatic contours consistent with cirrhosis. Gallbladder contracted. No discrete hepatic mass. Enlargement of lateral segment LEFT lobe. Pancreas: Normal appearance Spleen: Vague area of hypoechogenicity 7 mm diameter, nonspecific  Adrenals/Urinary Tract: Slightly malrotated kidneys with hilum directed anteriorly. Adrenal glands, kidneys, ureters, and bladder otherwise normal appearance Stomach/Bowel: Normal appendix. Segmental wall thickening  of the colon from hepatic flexure to mid transverse colon consistent with colitis. Remainder of colon normal appearance. No colonic mass or stricture. Stomach and small bowel loops unremarkable. Vascular/Lymphatic: Atherosclerotic calcifications aorta and iliac arteries without aneurysm. Vascular structures patent. No adenopathy. Collateral identified from LEFT portal vein to anterior abdominal wall and into the LEFT hypogastric vein. Reproductive: Unremarkable prostate gland and seminal vesicles Other: Scattered subcutaneous edema. No free air or free fluid. Small LEFT inguinal hernia containing fat. Musculoskeletal: Advanced degenerative disc disease changes at L3-L4 with BILATERAL spondylolysis of L3 and grade 1 anterolisthesis. IMPRESSION: Cirrhotic liver with collateral vessel from LEFT portal vein to LEFT hypogastric vein. Segmental wall thickening of the colon from hepatic flexure to mid transverse colon consistent with colitis; differential diagnosis includes infection and inflammatory bowel disease, ischemia considered less likely due to lack of significant vascular disease changes,. Patchy infiltrates RIGHT lower lobe question pneumonia. Small LEFT inguinal hernia containing fat. BILATERAL spondylolysis L3 with grade 1 anterolisthesis at L3-L4. Aortic Atherosclerosis (ICD10-I70.0). Electronically Signed   By: Lavonia Dana M.D.   On: 05/30/2020 16:02   DG Chest Port 1 View  Result Date: 05/28/2020 CLINICAL DATA:  Shortness of breath. EXAM: PORTABLE CHEST 1 VIEW COMPARISON:  May 26, 2020. FINDINGS: Stable cardiomegaly with mild central pulmonary vascular congestion. No pneumothorax or pleural effusion is noted. No consolidative process is noted. Bony thorax is unremarkable. IMPRESSION: Stable cardiomegaly with mild central pulmonary vascular congestion. Electronically Signed   By: Marijo Conception M.D.   On: 05/28/2020 08:20   DG Chest Port 1 View  Result Date: 05/26/2020 CLINICAL DATA:  Short of  breath.  Lower extremity edema EXAM: PORTABLE CHEST 1 VIEW COMPARISON:  November 11, 2011 FINDINGS: There is cardiomegaly with mild pulmonary venous hypertension. There is no appreciable edema or airspace opacity. No adenopathy. No bone lesions. IMPRESSION: Cardiomegaly with a degree of pulmonary vascular congestion. No edema or airspace opacity. Electronically Signed   By: Lowella Grip III M.D.   On: 05/26/2020 16:14   DG ABD ACUTE 2+V W 1V CHEST  Result Date: 05/31/2020 CLINICAL DATA:  Abdominal pain and distention. EXAM: DG ABDOMEN ACUTE WITH 1 VIEW CHEST COMPARISON:  May 30, 2020. FINDINGS: There is no evidence of free intraperitoneal air. Stable air-filled transverse colon is noted. No significant small bowel dilatation is noted. No radiopaque calculi or other significant radiographic abnormality is seen. Stable cardiomegaly. Both lungs are clear. IMPRESSION: Stable air-filled transverse colon is noted which may represent ileus. Electronically Signed   By: Marijo Conception M.D.   On: 05/31/2020 08:36   DG ABD ACUTE 2+V W 1V CHEST  Result Date: 05/30/2020 CLINICAL DATA:  Pain.  Recent congestive heart failure EXAM: DG ABDOMEN ACUTE WITH 1 VIEW CHEST COMPARISON:  Chest radiograph May 28, 2020. FINDINGS: AP chest: There is no edema or airspace opacity. There is cardiomegaly with mild pulmonary venous hypertension. No adenopathy. Supine and upright abdomen: There is dilatation of transverse colon. No appreciable small bowel dilatation. No appreciable air-fluid levels. No free air. Probable small phlebolith left pelvis. IMPRESSION: Transverse colon dilatation. Question a degree of colonic ileus. Bowel obstruction not felt to be likely. No free air. Cardiomegaly with a degree of pulmonary vascular congestion noted. No edema or consolidation. Electronically Signed   By: Lowella Grip III M.D.   On: 05/30/2020  08:16   ECHOCARDIOGRAM COMPLETE  Result Date: 05/27/2020    ECHOCARDIOGRAM REPORT    Patient Name:   William Barrett Date of Exam: 05/27/2020 Medical Rec #:  [id number]         Height:       68.0 in Accession #:    [id number]        Weight:       251.8 lb Date of Birth:  02-15-1961         BSA:          2.254 m Patient Age:    70 years          BP:           126/87 mmHg Patient Gender: M                 HR:           84 bpm. Exam Location:  [facility] Procedure: 2D Echo Indications:    Congestive Heart Failure I50.9  History:        Patient has prior history of Echocardiogram examinations, most                 recent 12/30/2012. CHF; Risk Factors:Current Smoker and                 Hypertension. Chronic alcohol abuse, Acute respiratory failure                 with hypoxia.  Sonographer:    Leavy Cella RDCS (AE) Referring Phys: [place]  1. Left ventricular ejection fraction, by estimation, is >75%. The left ventricle has hyperdynamic function. The left ventricle has no regional wall motion abnormalities. There is severe left ventricular hypertrophy. Left ventricular diastolic parameters were normal.  2. Right ventricular systolic function is mildly reduced. The right ventricular size is mildly enlarged.  3. Right atrial size was mildly dilated.  4. The aortic valve is tricuspid. Aortic valve regurgitation is not visualized. Mild aortic valve sclerosis is present, with no evidence of aortic valve stenosis.  5. The inferior vena cava is normal in size with greater than 50% respiratory variability, suggesting right atrial pressure of 3 mmHg. FINDINGS  Left Ventricle: Left ventricular ejection fraction, by estimation, is >75%. The left ventricle has hyperdynamic function. The left ventricle has no regional wall motion abnormalities. The left ventricular internal cavity size was normal in size. There is severe left ventricular hypertrophy. Left ventricular diastolic parameters were normal. Right Ventricle: The right ventricular size is mildly enlarged. Right vetricular wall  thickness was not assessed. Right ventricular systolic function is mildly reduced. Left Atrium: Left atrial size was normal in size. Right Atrium: Right atrial size was mildly dilated. Pericardium: There is no evidence of pericardial effusion. Mitral Valve: The mitral valve is abnormal. There is mild thickening of the mitral valve leaflet(s). Mild mitral annular calcification. Trivial mitral valve regurgitation. Tricuspid Valve: The tricuspid valve is normal in structure. Tricuspid valve regurgitation is mild. Aortic Valve: The aortic valve is tricuspid. Aortic valve regurgitation is not visualized. Mild aortic valve sclerosis is present, with no evidence of aortic valve stenosis. Aortic valve mean gradient measures 11.7 mmHg. Aortic valve peak gradient measures 16.8 mmHg. Aortic valve area, by VTI measures 3.11 cm. Pulmonic Valve: The pulmonic valve was normal in structure. Pulmonic valve regurgitation is not visualized. Aorta: The aortic root and ascending aorta are structurally normal, with no evidence of dilitation. Venous: The inferior vena cava  is normal in size with greater than 50% respiratory variability, suggesting right atrial pressure of 3 mmHg.  LEFT VENTRICLE PLAX 2D LVIDd:         4.58 cm  Diastology LVIDs:         2.22 cm  LV e' medial:    8.38 cm/s LV PW:         1.84 cm  LV E/e' medial:  13.7 LV IVS:        1.47 cm  LV e' lateral:   10.20 cm/s LVOT diam:     2.10 cm  LV E/e' lateral: 11.3 LV SV:         134 LV SV Index:   60 LVOT Area:     3.46 cm  RIGHT VENTRICLE RV S prime:     14.10 cm/s TAPSE (M-mode): 2.8 cm LEFT ATRIUM             Index       RIGHT ATRIUM           Index LA diam:        4.90 cm 2.17 cm/m  RA Area:     20.00 cm LA Vol (A2C):   75.6 ml 33.54 ml/m RA Volume:   59.40 ml  26.36 ml/m LA Vol (A4C):   48.8 ml 21.65 ml/m LA Biplane Vol: 63.3 ml 28.09 ml/m  AORTIC VALVE AV Area (Vmax):    2.97 cm AV Area (Vmean):   2.92 cm AV Area (VTI):     3.11 cm AV Vmax:           205.00  cm/s AV Vmean:          164.333 cm/s AV VTI:            0.431 m AV Peak Grad:      16.8 mmHg AV Mean Grad:      11.7 mmHg LVOT Vmax:         176.00 cm/s LVOT Vmean:        138.667 cm/s LVOT VTI:          0.387 m LVOT/AV VTI ratio: 0.90  AORTA Ao Root diam: 3.40 cm MITRAL VALVE                TRICUSPID VALVE MV Area (PHT): 2.99 cm     TR Peak grad:   29.4 mmHg MV Decel Time: 254 msec     TR Vmax:        271.00 cm/s MR Peak grad: 62.1 mmHg MR Vmax:      394.00 cm/s   SHUNTS MV E velocity: 115.00 cm/s  Systemic VTI:  0.39 m MV A velocity: 86.70 cm/s   Systemic Diam: 2.10 cm MV E/A ratio:  1.33 Dorris Carnes MD Electronically signed by Dorris Carnes MD Signature Date/Time: 05/27/2020/6:36:32 PM    Final   [2 weeks]   Assessment:  60 year old male presenting with dyspnea and admitted for acute on chronic diastolic CHF, lobar pneumonia, acute respiratory failure with hypoxia.  Patient with known history of alcoholic cirrhosis with poor GI outpatient follow-up.  GI consulted for colopathy noted on CT. Patient's hospital course complicated by etoh withdrawal.  Abnormal colon on CT: Noted wall thickening from the hepatic flexure to the mid transverse colon.  Initially was having diarrhea.  GI pathogen panel and C. difficile were both negative.  ID consulted, recommended Rocephin for coverage of colitis and possible aspiration PNA, discontinued yesterday.  CT indicates low likelihood of ischemia given  no significant vascular changes and location of colon abnormality.  Colopathy related to cirrhosis unlikely given segmental abnormality. Differentials also include infectious versus inflammatory versus neoplastic process, favoring infectious etiology.  Fortunately stools have been improving.  Cirrhosis: Appears well compensated with MELD Na of 7. Mild elevation of transaminases with slightly trend upward last 48 hours. ?drug related.  Noted thrombocytopenia.  Would recommend endoscopy for variceal screening as an  outpatient.    Plan: 1. Consider outpatient colonoscopy to evaluate abnormal colon findings on CT, upper endoscopy for esophageal variceal screening. 2. Recommend outpatient follow-up for management of alcoholic cirrhosis. 3. Follow LFTs.  4. Recommend alcohol cessation discussed at length with patient. He is aware to ask for outpatient resources prior to discharge if interested. 5. Follow-up AFP. 6. Follow up Hep B surf Ab, Hep A total Ab, vaccinate if appropriate based on results.  7. Will follow peripherally.   Laureen Ochs. Bernarda Caffey [facility] [phone] 3/24/202210:20 AM     LOS: 11 days

## 2020-06-07 LAB — AFP TUMOR MARKER: AFP, Serum, Tumor Marker: 6.1 ng/mL (ref 0.0–8.4)

## 2020-06-07 LAB — HEPATITIS B SURFACE ANTIBODY, QUANTITATIVE: Hep B S AB Quant (Post): <3.1 m[IU]/mL — ABNORMAL LOW (ref >9.9)

## 2020-06-10 ENCOUNTER — Encounter: Payer: Self-pay | Admitting: *Deleted

## 2020-06-17 ENCOUNTER — Telehealth: Payer: Self-pay | Admitting: Internal Medicine

## 2020-06-17 NOTE — Telephone Encounter (Signed)
Pt's girlfriend called asking was the patient Hep A or B or both. Please call [phone]

## 2020-06-17 NOTE — Telephone Encounter (Signed)
Spoke with pts significant other and explained lab results again. She understands results and recommendations.

## 2020-06-19 ENCOUNTER — Telehealth: Payer: Self-pay | Admitting: Internal Medicine

## 2020-06-19 DIAGNOSIS — I10 Essential (primary) hypertension: Secondary | ICD-10-CM | POA: Diagnosis not present

## 2020-06-19 DIAGNOSIS — I5040 Unspecified combined systolic (congestive) and diastolic (congestive) heart failure: Secondary | ICD-10-CM | POA: Diagnosis not present

## 2020-06-19 DIAGNOSIS — Z0189 Encounter for other specified special examinations: Secondary | ICD-10-CM | POA: Diagnosis not present

## 2020-06-19 DIAGNOSIS — K717 Toxic liver disease with fibrosis and cirrhosis of liver: Secondary | ICD-10-CM | POA: Diagnosis not present

## 2020-06-19 NOTE — Telephone Encounter (Signed)
Pt's girlfriend called asking if the patient really needed to get the Hep A and B, because he doesn't want to take it if he doesn't need to. He is following up with his PCP this afternoon. Please call [phone]

## 2020-06-19 NOTE — Telephone Encounter (Signed)
Spoke with pts girl friend. She is aware that pt was asked to get the vaccinations since he didn't have immunity to Hep A & B. Pt can discuss further if needed.

## 2020-06-19 NOTE — Telephone Encounter (Signed)
Correction- Hep A/B shot

## 2020-06-25 ENCOUNTER — Ambulatory Visit: Payer: Medicare Other | Admitting: Gastroenterology

## 2020-06-26 ENCOUNTER — Ambulatory Visit (INDEPENDENT_AMBULATORY_CARE_PROVIDER_SITE_OTHER): Payer: Medicare HMO | Admitting: Gastroenterology

## 2020-06-26 ENCOUNTER — Other Ambulatory Visit: Payer: Self-pay

## 2020-06-26 ENCOUNTER — Encounter: Payer: Self-pay | Admitting: Gastroenterology

## 2020-06-26 VITALS — BP 122/75 | HR 82 | Temp 97.7°F | Ht 68.0 in | Wt 214.8 lb

## 2020-06-26 DIAGNOSIS — K7030 Alcoholic cirrhosis of liver without ascites: Secondary | ICD-10-CM | POA: Diagnosis not present

## 2020-06-26 DIAGNOSIS — K639 Disease of intestine, unspecified: Secondary | ICD-10-CM | POA: Diagnosis not present

## 2020-06-26 NOTE — Progress Notes (Signed)
Primary Care Physician: [name], MD  Primary Gastroenterologist:  Garfield Cornea, MD   Chief Complaint  Patient presents with  . HFU    F/u. Reports doing better. Stopped drinking once he left the hospital    HPI: William Barrett is a 60 y.o. male here.  Patient was seen last month when he was hospitalized for acute on chronic diastolic CHF, lobar pneumonia, acute respiratory failure with hypoxia.  We were asked to see him for colopathy noted on CT.  Patient's hospital course was complicated with alcohol withdrawals.  On CT he was noted to have wall thickening from the hepatic flexure to the mid transverse colon.  Patient was having diarrhea initially.  GI pathogen panel and C. difficile were both negative.  ID was consulted by attending, recommended Rocephin for coverage of colitis and possible aspiration pneumonia.  CT findings felt to be infectious versus inflammatory versus neoplastic, favoring infectious etiology given clinical presentation.  Felt to be less likely ischemia given no significant vascular changes in the location of the colon abnormality.  Colopathy also felt to be less likely because of the segmental abnormality.  Patient does have a history of cirrhosis, appears to be well compensated during his admission. MELD Na of 7.  Does have thrombocytopenia and EGD for variceal screening along with colonoscopy was advised as an outpatient. Advised patient to get hepatitis A and B vaccinations based on lack of immunity.  He is up-to-date on hepatoma screening.  Patient presents today with his girlfriend.  He is feeling much better.  He quit drinking, nothing since he was hospitalized.  Overall his swelling has still been minimal in the lower extremities.  He denies any abdominal pain.  His appetite has improved.  No heartburn, constipation, diarrhea, melena, rectal bleeding.  No breathing concerns.  January 2021: 180 pounds March 2022: 225 pound Today: 214  pounds  Current Outpatient Medications  Medication Sig Dispense Refill  . furosemide (LASIX) 40 MG tablet Take 1 tablet (40 mg total) by mouth daily. 30 tablet 1  . metoprolol tartrate (LOPRESSOR) 25 MG tablet Take 1 tablet (25 mg total) by mouth 2 (two) times daily. 60 tablet 0   No current facility-administered medications for this visit.    Allergies as of 06/26/2020  . (No Known Allergies)   Past Medical History:  Diagnosis Date  . Alcoholism ([place])   . Anemia   . Cardiac murmur    Has been evaluated by cardiology with echocardiogram in 2014 without significant findings  . Hypertension   . Tachycardia, unspecified   . Thrombocytopenia ([place])   . Undiagnosed cardiac murmurs    Past Surgical History:  Procedure Laterality Date  . ESOPHAGOGASTRODUODENOSCOPY  09/21/2008   RMR: Distal esophageal erosions consistent with erosive reflux esophagitis.Two benign-appearing prepyloric ulcers, otherwise unremarkable/small HH   Family History  Problem Relation Age of Onset  . Hypertension Mother   . Hypertension Father   . Liver disease Neg Hx   . Colon cancer Neg Hx    Social History   Tobacco Use  . Smoking status: Current Every Day Smoker    Packs/day: 3.00    Years: 25.00    Pack years: 75.00    Types: Cigarettes, Cigars  . Smokeless tobacco: Never Used  Substance Use Topics  . Alcohol use: Yes    Comment: 2 40oz beers daily, patient quit drinking March 2022   . Drug use: No    ROS:  General: Negative for  anorexia, weight loss, fever, chills, fatigue, weakness. ENT: Negative for hoarseness, difficulty swallowing , nasal congestion. CV: Negative for chest pain, angina, palpitations, dyspnea on exertion, peripheral edema.  Respiratory: Negative for dyspnea at rest, dyspnea on exertion, cough, sputum, wheezing.  GI: See history of present illness. GU:  Negative for dysuria, hematuria, urinary incontinence, urinary frequency, nocturnal urination.  Endo: Negative for  unusual weight change.    Physical Examination:   BP 122/75   Pulse 82   Temp 97.7 F (36.5 C)   Ht 5\' 8"  (1.727 m)   Wt 214 lb 12.8 oz (97.4 kg)   BMI 32.66 kg/m   General: Well-nourished, well-developed in no acute distress.  Eyes: No icterus. Mouth:masked Lungs: Clear to auscultation bilaterally.  Heart: Regular rate and rhythm, no murmurs rubs or gallops.  Abdomen: Bowel sounds are normal, nontender, nondistended, no hepatosplenomegaly or masses, no abdominal bruits or hernia , no rebound or guarding.   Extremities: trace bilateral pedal lower extremity edema. No clubbing or deformities. Neuro: Alert and oriented x 4   Skin: Warm and dry, no jaundice.   Psych: Alert and cooperative, normal mood and affect.  Labs:  Lab Results  Component Value Date   CREATININE 0.65 06/06/2020   BUN 14 06/06/2020   NA 132 (L) 06/06/2020   K 4.5 06/06/2020   CL 91 (L) 06/06/2020   CO2 28 06/06/2020   Lab Results  Component Value Date   ALT 67 (H) 06/06/2020   AST 64 (H) 06/06/2020   ALKPHOS 61 06/06/2020   BILITOT 0.9 06/06/2020   Lab Results  Component Value Date   WBC 4.6 06/06/2020   HGB 15.7 06/06/2020   HCT 47.0 06/06/2020   MCV 111.6 (H) 06/06/2020   PLT 100 (L) 06/06/2020   Lab Results  Component Value Date   INR 1.1 06/06/2020   INR 1.1 05/26/2020   INR 1.04 03/05/2014    Imaging Studies: DG Chest 2 View  Result Date: 06/01/2020 CLINICAL DATA:  60 year old male with shortness of breath. Negative for COVID-19. 6 days ago. EXAM: CHEST - 2 VIEW COMPARISON:  Chest radiographs 05/28/2020 and earlier. FINDINGS: Upright AP and lateral views of the chest today. Large lung volumes with increased AP dimension to the chest. Stable cardiomegaly and mediastinal contours. Visualized tracheal air column is within normal limits. Pulmonary vascularity and/or interstitial markings appear stable over this series of exams, although increased since 2013. No pneumothorax. No pleural  effusion or consolidation. Paucity of bowel gas in the upper abdomen. No acute osseous abnormality identified. IMPRESSION: Chronic cardiomegaly with pulmonary hyperinflation and nonspecific interstitial markings increased since 2013. Cannot exclude viral/atypical respiratory infection, or mild interstitial edema. Otherwise, these could be chronic interstitial lung changes. Electronically Signed   By: Genevie Ann M.D.   On: 06/01/2020 12:01   CT ABDOMEN PELVIS W CONTRAST  Result Date: 05/30/2020 CLINICAL DATA:  Bowel obstruction, constipation, bloating, generalized abdominal pain EXAM: CT ABDOMEN AND PELVIS WITH CONTRAST TECHNIQUE: Multidetector CT imaging of the abdomen and pelvis was performed using the standard protocol following bolus administration of intravenous contrast. Sagittal and coronal MPR images reconstructed from axial data set. CONTRAST:  143mL OMNIPAQUE IOHEXOL 300 MG/ML SOLN IV. Water-soluble rectal contrast administered. COMPARISON:  06/17/2012 FINDINGS: Lower chest: Patchy infiltrates RIGHT lower lobe. Minimal subsegmental atelectasis LEFT lower lobe. Hepatobiliary: Irregular nodular hepatic contours consistent with cirrhosis. Gallbladder contracted. No discrete hepatic mass. Enlargement of lateral segment LEFT lobe. Pancreas: Normal appearance Spleen: Vague area of hypoechogenicity 7 mm  diameter, nonspecific Adrenals/Urinary Tract: Slightly malrotated kidneys with hilum directed anteriorly. Adrenal glands, kidneys, ureters, and bladder otherwise normal appearance Stomach/Bowel: Normal appendix. Segmental wall thickening of the colon from hepatic flexure to mid transverse colon consistent with colitis. Remainder of colon normal appearance. No colonic mass or stricture. Stomach and small bowel loops unremarkable. Vascular/Lymphatic: Atherosclerotic calcifications aorta and iliac arteries without aneurysm. Vascular structures patent. No adenopathy. Collateral identified from LEFT portal vein to  anterior abdominal wall and into the LEFT hypogastric vein. Reproductive: Unremarkable prostate gland and seminal vesicles Other: Scattered subcutaneous edema. No free air or free fluid. Small LEFT inguinal hernia containing fat. Musculoskeletal: Advanced degenerative disc disease changes at L3-L4 with BILATERAL spondylolysis of L3 and grade 1 anterolisthesis. IMPRESSION: Cirrhotic liver with collateral vessel from LEFT portal vein to LEFT hypogastric vein. Segmental wall thickening of the colon from hepatic flexure to mid transverse colon consistent with colitis; differential diagnosis includes infection and inflammatory bowel disease, ischemia considered less likely due to lack of significant vascular disease changes,. Patchy infiltrates RIGHT lower lobe question pneumonia. Small LEFT inguinal hernia containing fat. BILATERAL spondylolysis L3 with grade 1 anterolisthesis at L3-L4. Aortic Atherosclerosis (ICD10-I70.0). Electronically Signed   By: Lavonia Dana M.D.   On: 05/30/2020 16:02   DG Chest Port 1 View  Result Date: 05/28/2020 CLINICAL DATA:  Shortness of breath. EXAM: PORTABLE CHEST 1 VIEW COMPARISON:  May 26, 2020. FINDINGS: Stable cardiomegaly with mild central pulmonary vascular congestion. No pneumothorax or pleural effusion is noted. No consolidative process is noted. Bony thorax is unremarkable. IMPRESSION: Stable cardiomegaly with mild central pulmonary vascular congestion. Electronically Signed   By: Marijo Conception M.D.   On: 05/28/2020 08:20   DG ABD ACUTE 2+V W 1V CHEST  Result Date: 05/31/2020 CLINICAL DATA:  Abdominal pain and distention. EXAM: DG ABDOMEN ACUTE WITH 1 VIEW CHEST COMPARISON:  May 30, 2020. FINDINGS: There is no evidence of free intraperitoneal air. Stable air-filled transverse colon is noted. No significant small bowel dilatation is noted. No radiopaque calculi or other significant radiographic abnormality is seen. Stable cardiomegaly. Both lungs are clear.  IMPRESSION: Stable air-filled transverse colon is noted which may represent ileus. Electronically Signed   By: Marijo Conception M.D.   On: 05/31/2020 08:36   DG ABD ACUTE 2+V W 1V CHEST  Result Date: 05/30/2020 CLINICAL DATA:  Pain.  Recent congestive heart failure EXAM: DG ABDOMEN ACUTE WITH 1 VIEW CHEST COMPARISON:  Chest radiograph May 28, 2020. FINDINGS: AP chest: There is no edema or airspace opacity. There is cardiomegaly with mild pulmonary venous hypertension. No adenopathy. Supine and upright abdomen: There is dilatation of transverse colon. No appreciable small bowel dilatation. No appreciable air-fluid levels. No free air. Probable small phlebolith left pelvis. IMPRESSION: Transverse colon dilatation. Question a degree of colonic ileus. Bowel obstruction not felt to be likely. No free air. Cardiomegaly with a degree of pulmonary vascular congestion noted. No edema or consolidation. Electronically Signed   By: Lowella Grip III M.D.   On: 05/30/2020 08:16   ECHOCARDIOGRAM COMPLETE  Result Date: 05/27/2020    ECHOCARDIOGRAM REPORT   Patient Name:   William Barrett Date of Exam: 05/27/2020 Medical Rec #:  [id number]         Height:       68.0 in Accession #:    [id number]        Weight:       251.8 lb Date of Birth:  [birth year]  BSA:          2.254 m Patient Age:    33 years          BP:           126/87 mmHg Patient Gender: M                 HR:           84 bpm. Exam Location:  [facility] Procedure: 2D Echo Indications:    Congestive Heart Failure I50.9  History:        Patient has prior history of Echocardiogram examinations, most                 recent 12/30/2012. CHF; Risk Factors:Current Smoker and                 Hypertension. Chronic alcohol abuse, Acute respiratory failure                 with hypoxia.  Sonographer:    Leavy Cella RDCS (AE) Referring Phys: [place]  1. Left ventricular ejection fraction, by estimation, is >75%. The left ventricle has  hyperdynamic function. The left ventricle has no regional wall motion abnormalities. There is severe left ventricular hypertrophy. Left ventricular diastolic parameters were normal.  2. Right ventricular systolic function is mildly reduced. The right ventricular size is mildly enlarged.  3. Right atrial size was mildly dilated.  4. The aortic valve is tricuspid. Aortic valve regurgitation is not visualized. Mild aortic valve sclerosis is present, with no evidence of aortic valve stenosis.  5. The inferior vena cava is normal in size with greater than 50% respiratory variability, suggesting right atrial pressure of 3 mmHg. FINDINGS  Left Ventricle: Left ventricular ejection fraction, by estimation, is >75%. The left ventricle has hyperdynamic function. The left ventricle has no regional wall motion abnormalities. The left ventricular internal cavity size was normal in size. There is severe left ventricular hypertrophy. Left ventricular diastolic parameters were normal. Right Ventricle: The right ventricular size is mildly enlarged. Right vetricular wall thickness was not assessed. Right ventricular systolic function is mildly reduced. Left Atrium: Left atrial size was normal in size. Right Atrium: Right atrial size was mildly dilated. Pericardium: There is no evidence of pericardial effusion. Mitral Valve: The mitral valve is abnormal. There is mild thickening of the mitral valve leaflet(s). Mild mitral annular calcification. Trivial mitral valve regurgitation. Tricuspid Valve: The tricuspid valve is normal in structure. Tricuspid valve regurgitation is mild. Aortic Valve: The aortic valve is tricuspid. Aortic valve regurgitation is not visualized. Mild aortic valve sclerosis is present, with no evidence of aortic valve stenosis. Aortic valve mean gradient measures 11.7 mmHg. Aortic valve peak gradient measures 16.8 mmHg. Aortic valve area, by VTI measures 3.11 cm. Pulmonic Valve: The pulmonic valve was normal in  structure. Pulmonic valve regurgitation is not visualized. Aorta: The aortic root and ascending aorta are structurally normal, with no evidence of dilitation. Venous: The inferior vena cava is normal in size with greater than 50% respiratory variability, suggesting right atrial pressure of 3 mmHg.  LEFT VENTRICLE PLAX 2D LVIDd:         4.58 cm  Diastology LVIDs:         2.22 cm  LV e' medial:    8.38 cm/s LV PW:         1.84 cm  LV E/e' medial:  13.7 LV IVS:        1.47 cm  LV e' lateral:   10.20 cm/s LVOT diam:     2.10 cm  LV E/e' lateral: 11.3 LV SV:         134 LV SV Index:   60 LVOT Area:     3.46 cm  RIGHT VENTRICLE RV S prime:     14.10 cm/s TAPSE (M-mode): 2.8 cm LEFT ATRIUM             Index       RIGHT ATRIUM           Index LA diam:        4.90 cm 2.17 cm/m  RA Area:     20.00 cm LA Vol (A2C):   75.6 ml 33.54 ml/m RA Volume:   59.40 ml  26.36 ml/m LA Vol (A4C):   48.8 ml 21.65 ml/m LA Biplane Vol: 63.3 ml 28.09 ml/m  AORTIC VALVE AV Area (Vmax):    2.97 cm AV Area (Vmean):   2.92 cm AV Area (VTI):     3.11 cm AV Vmax:           205.00 cm/s AV Vmean:          164.333 cm/s AV VTI:            0.431 m AV Peak Grad:      16.8 mmHg AV Mean Grad:      11.7 mmHg LVOT Vmax:         176.00 cm/s LVOT Vmean:        138.667 cm/s LVOT VTI:          0.387 m LVOT/AV VTI ratio: 0.90  AORTA Ao Root diam: 3.40 cm MITRAL VALVE                TRICUSPID VALVE MV Area (PHT): 2.99 cm     TR Peak grad:   29.4 mmHg MV Decel Time: 254 msec     TR Vmax:        271.00 cm/s MR Peak grad: 62.1 mmHg MR Vmax:      394.00 cm/s   SHUNTS MV E velocity: 115.00 cm/s  Systemic VTI:  0.39 m MV A velocity: 86.70 cm/s   Systemic Diam: 2.10 cm MV E/A ratio:  1.33 Dorris Carnes MD Electronically signed by Dorris Carnes MD Signature Date/Time: 05/27/2020/6:36:32 PM    Final    Assessment:  60 year old male with history of alcoholic cirrhosis with poor GI outpatient follow-up, abnormal colon on CT during recent hospitalization presenting for  follow-up.  Alcoholic cirrhosis: Patient with history of poor follow-up as an outpatient.  Reminded him of the necessity to keep follow-up appointments.  He will need periodic labs, hepatoma screening twice yearly.  Fortunately most recent MELD Na of 7.  He had mild elevation of transaminases trended upward during his recent hospitalization.  Morning to follow-up on those.  He is also due for EGD for variceal screening.  Abnormal: On CT: Noted wall thickening from the hepatic flexure to the mid transverse colon.  Initially he was having diarrhea, stool studies were negative.  It is felt that he likely did have an infectious process as opposed to inflammatory versus ischemic versus neoplastic process.  Although colopathy remains on the differential given his cirrhosis, given segmental abnormality, favored to be less likely.  Colonoscopy recommended for further evaluation.   Plan:  1. Colonoscopy/EGD/possible esophageal variceal banding with Dr. [name] with propofol. ASA III.  I have discussed the risks, alternatives, benefits with regards to but not  limited to the risk of reaction to medication, bleeding, infection, perforation and the patient is agreeable to proceed. Written consent to be obtained. 2. Continue alcohol cessation. 3. CBC and LFTs to be done next month when he goes to his PCP.  He will take our orders with him. 4. Return office visit in 6 months for cirrhosis care.

## 2020-06-26 NOTE — Patient Instructions (Signed)
1. Congratulations on stopping the alcohol! Keep up the good work! 2. Please have your labs done with Dr. [name] next month, take our orders when you go. If you have not heard from me within 5 business days, please contact us about results. 3. Colonoscopy and upper endoscopy to be scheduled.  Please see separate instructions. 4. Call if you have any questions or concerns.   5. Call if you note abdominal swelling, swelling of the legs, blood in the stool, problems breathing, lightheadedness.  6. Return office visit in 6 months.   At [facility] we value your feedback. You may receive a survey about your visit today. Please share your experience as we strive to create trusting relationships with our patients to provide genuine, compassionate, quality care.   We appreciate your understanding and patience as we review any laboratory studies, imaging, and other diagnostic tests that are ordered as we care for you. Our office policy is 5 business days for review of these results, and any emergent or urgent results are addressed in a timely manner for your best interest. If you do not hear from our office in 1 week, please contact us.    We also encourage the use of MyChart, which contains your medical information for your review as well. If you are not enrolled in this feature, an access code is on this after visit summary for your convenience. Thank you for allowing [place] to be involved in your care.

## 2020-06-27 ENCOUNTER — Telehealth: Payer: Self-pay | Admitting: *Deleted

## 2020-06-27 MED ORDER — PEG 3350-KCL-NA BICARB-NACL 420 G PO SOLR: ORAL | 0 refills | Status: DC

## 2020-06-27 NOTE — Telephone Encounter (Signed)
Called pt and spoke with gf (on dpr). TCS/EGD w/ +/- esoph banding with propofol, Dr. [name], ASA 3 scheduled for 6/27 at 11:00am. Aware will mail prep instructions with pre-op/covid test appt. Confirmed pharmacy. Confirmed address.   PA approved via [employer] for TCS/EGD. Auth# [id number] DOS 09/09/2020-10/09/2020

## 2020-07-01 ENCOUNTER — Encounter: Payer: Self-pay | Admitting: Gastroenterology

## 2020-07-02 ENCOUNTER — Encounter: Payer: Self-pay | Admitting: *Deleted

## 2020-07-07 DIAGNOSIS — K639 Disease of intestine, unspecified: Secondary | ICD-10-CM | POA: Diagnosis not present

## 2020-07-07 DIAGNOSIS — I5033 Acute on chronic diastolic (congestive) heart failure: Secondary | ICD-10-CM | POA: Diagnosis not present

## 2020-07-07 DIAGNOSIS — I509 Heart failure, unspecified: Secondary | ICD-10-CM | POA: Diagnosis not present

## 2020-07-07 DIAGNOSIS — J9601 Acute respiratory failure with hypoxia: Secondary | ICD-10-CM | POA: Diagnosis not present

## 2020-07-07 DIAGNOSIS — R609 Edema, unspecified: Secondary | ICD-10-CM | POA: Diagnosis not present

## 2020-07-11 DIAGNOSIS — R609 Edema, unspecified: Secondary | ICD-10-CM | POA: Diagnosis not present

## 2020-07-11 DIAGNOSIS — I509 Heart failure, unspecified: Secondary | ICD-10-CM | POA: Diagnosis not present

## 2020-07-11 DIAGNOSIS — J9601 Acute respiratory failure with hypoxia: Secondary | ICD-10-CM | POA: Diagnosis not present

## 2020-07-11 DIAGNOSIS — I5033 Acute on chronic diastolic (congestive) heart failure: Secondary | ICD-10-CM | POA: Diagnosis not present

## 2020-07-11 DIAGNOSIS — K639 Disease of intestine, unspecified: Secondary | ICD-10-CM | POA: Diagnosis not present

## 2020-08-06 DIAGNOSIS — I509 Heart failure, unspecified: Secondary | ICD-10-CM | POA: Diagnosis not present

## 2020-08-06 DIAGNOSIS — R609 Edema, unspecified: Secondary | ICD-10-CM | POA: Diagnosis not present

## 2020-08-06 DIAGNOSIS — K639 Disease of intestine, unspecified: Secondary | ICD-10-CM | POA: Diagnosis not present

## 2020-08-06 DIAGNOSIS — I5033 Acute on chronic diastolic (congestive) heart failure: Secondary | ICD-10-CM | POA: Diagnosis not present

## 2020-08-06 DIAGNOSIS — J9601 Acute respiratory failure with hypoxia: Secondary | ICD-10-CM | POA: Diagnosis not present

## 2020-08-10 DIAGNOSIS — K639 Disease of intestine, unspecified: Secondary | ICD-10-CM | POA: Diagnosis not present

## 2020-08-10 DIAGNOSIS — J9601 Acute respiratory failure with hypoxia: Secondary | ICD-10-CM | POA: Diagnosis not present

## 2020-08-10 DIAGNOSIS — R609 Edema, unspecified: Secondary | ICD-10-CM | POA: Diagnosis not present

## 2020-08-10 DIAGNOSIS — I509 Heart failure, unspecified: Secondary | ICD-10-CM | POA: Diagnosis not present

## 2020-08-10 DIAGNOSIS — I5033 Acute on chronic diastolic (congestive) heart failure: Secondary | ICD-10-CM | POA: Diagnosis not present

## 2020-08-13 DIAGNOSIS — I10 Essential (primary) hypertension: Secondary | ICD-10-CM | POA: Diagnosis not present

## 2020-08-13 DIAGNOSIS — Z0000 Encounter for general adult medical examination without abnormal findings: Secondary | ICD-10-CM | POA: Diagnosis not present

## 2020-08-13 DIAGNOSIS — Z125 Encounter for screening for malignant neoplasm of prostate: Secondary | ICD-10-CM | POA: Diagnosis not present

## 2020-08-13 DIAGNOSIS — Z131 Encounter for screening for diabetes mellitus: Secondary | ICD-10-CM | POA: Diagnosis not present

## 2020-08-30 DIAGNOSIS — R7303 Prediabetes: Secondary | ICD-10-CM | POA: Diagnosis not present

## 2020-08-30 DIAGNOSIS — I10 Essential (primary) hypertension: Secondary | ICD-10-CM | POA: Diagnosis not present

## 2020-08-30 DIAGNOSIS — D6949 Other primary thrombocytopenia: Secondary | ICD-10-CM | POA: Diagnosis not present

## 2020-08-30 DIAGNOSIS — E870 Hyperosmolality and hypernatremia: Secondary | ICD-10-CM | POA: Diagnosis not present

## 2020-08-30 DIAGNOSIS — R011 Cardiac murmur, unspecified: Secondary | ICD-10-CM | POA: Diagnosis not present

## 2020-08-30 DIAGNOSIS — E782 Mixed hyperlipidemia: Secondary | ICD-10-CM | POA: Diagnosis not present

## 2020-08-30 DIAGNOSIS — I5042 Chronic combined systolic (congestive) and diastolic (congestive) heart failure: Secondary | ICD-10-CM | POA: Diagnosis not present

## 2020-08-30 DIAGNOSIS — Z0001 Encounter for general adult medical examination with abnormal findings: Secondary | ICD-10-CM | POA: Diagnosis not present

## 2020-08-30 DIAGNOSIS — D7589 Other specified diseases of blood and blood-forming organs: Secondary | ICD-10-CM | POA: Diagnosis not present

## 2020-09-02 NOTE — Patient Instructions (Signed)
William Barrett  09/02/2020     @PREFPERIOPPHARMACY @   Your procedure is scheduled on  09/09/2020.   Report to [facility] at  0830  A.M.   Call this number if you have problems the morning of surgery:  [phone]   Remember:  Follow the diet and prep instructions given to you buy the office.    Take these medicines the morning of surgery with A SIP OF WATER           metoprolol.        Please brush your teeth.  Do not wear jewelry, make-up or nail polish.  Do not wear lotions, powders, or perfumes, or deodorant.  Do not shave 48 hours prior to surgery.  Men may shave face and neck.  Do not bring valuables to the hospital.  [facility] is not responsible for any belongings or valuables.  Contacts, dentures or bridgework may not be worn into surgery.  Leave your suitcase in the car.  After surgery it may be brought to your room.  For patients admitted to the hospital, discharge time will be determined by your treatment team.  Patients discharged the day of surgery will not be allowed to drive home and must have someone with them for 24 hours.    Special instructions:     DO NOT smoke tobacco or vape for 24 hours before your procedure.  Please read over the following fact sheets that you were given. Anesthesia Post-op Instructions and Care and Recovery After Surgery      Upper Endoscopy, Adult, Care After This sheet gives you information about how to care for yourself after your procedure. Your health care provider may also give you more specific instructions. If you have problems or questions, contact your health careprovider. What can I expect after the procedure? After the procedure, it is common to have: A sore throat. Mild stomach pain or discomfort. Bloating. Nausea. Follow these instructions at home:  Follow instructions from your health care provider about what to eat or drink after your procedure. Return to your normal activities as told by  your health care provider. Ask your health care provider what activities are safe for you. Take over-the-counter and prescription medicines only as told by your health care provider. If you were given a sedative during the procedure, it can affect you for several hours. Do not drive or operate machinery until your health care provider says that it is safe. Keep all follow-up visits as told by your health care provider. This is important. Contact a health care provider if you have: A sore throat that lasts longer than one day. Trouble swallowing. Get help right away if: You vomit blood or your vomit looks like coffee grounds. You have: A fever. Bloody, black, or tarry stools. A severe sore throat or you cannot swallow. Difficulty breathing. Severe pain in your chest or abdomen. Summary After the procedure, it is common to have a sore throat, mild stomach discomfort, bloating, and nausea. If you were given a sedative during the procedure, it can affect you for several hours. Do not drive or operate machinery until your health care provider says that it is safe. Follow instructions from your health care provider about what to eat or drink after your procedure. Return to your normal activities as told by your health care provider. This information is not intended to replace advice given to you by your health care provider. Make sure  you discuss any questions you have with your healthcare provider. Document Revised: 02/28/2019 Document Reviewed: 08/02/2017 Elsevier Patient Education  2022 [employer]. Colonoscopy, Adult, Care After This sheet gives you information about how to care for yourself after your procedure. Your health care provider may also give you more specific instructions. If you have problems or questions, contact your health careprovider. What can I expect after the procedure? After the procedure, it is common to have: A small amount of blood in your stool for 24 hours after  the procedure. Some gas. Mild cramping or bloating of your abdomen. Follow these instructions at home: Eating and drinking  Drink enough fluid to keep your urine pale yellow. Follow instructions from your health care provider about eating or drinking restrictions. Resume your normal diet as instructed by your health care provider. Avoid heavy or fried foods that are hard to digest.  Activity Rest as told by your health care provider. Avoid sitting for a long time without moving. Get up to take short walks every 1-2 hours. This is important to improve blood flow and breathing. Ask for help if you feel weak or unsteady. Return to your normal activities as told by your health care provider. Ask your health care provider what activities are safe for you. Managing cramping and bloating  Try walking around when you have cramps or feel bloated. Apply heat to your abdomen as told by your health care provider. Use the heat source that your health care provider recommends, such as a moist heat pack or a heating pad. Place a towel between your skin and the heat source. Leave the heat on for 20-30 minutes. Remove the heat if your skin turns bright red. This is especially important if you are unable to feel pain, heat, or cold. You may have a greater risk of getting burned.  General instructions If you were given a sedative during the procedure, it can affect you for several hours. Do not drive or operate machinery until your health care provider says that it is safe. For the first 24 hours after the procedure: Do not sign important documents. Do not drink alcohol. Do your regular daily activities at a slower pace than normal. Eat soft foods that are easy to digest. Take over-the-counter and prescription medicines only as told by your health care provider. Keep all follow-up visits as told by your health care provider. This is important. Contact a health care provider if: You have blood in your  stool 2-3 days after the procedure. Get help right away if you have: More than a small spotting of blood in your stool. Large blood clots in your stool. Swelling of your abdomen. Nausea or vomiting. A fever. Increasing pain in your abdomen that is not relieved with medicine. Summary After the procedure, it is common to have a small amount of blood in your stool. You may also have mild cramping and bloating of your abdomen. If you were given a sedative during the procedure, it can affect you for several hours. Do not drive or operate machinery until your health care provider says that it is safe. Get help right away if you have a lot of blood in your stool, nausea or vomiting, a fever, or increased pain in your abdomen. This information is not intended to replace advice given to you by your health care provider. Make sure you discuss any questions you have with your healthcare provider. Document Revised: 02/24/2019 Document Reviewed: 09/26/2018 Elsevier Patient Education  2022 Elsevier  [place] After This sheet gives you information about how to care for yourself after your procedure. Your health care provider may also give you more specific instructions. If you have problems or questions, contact your health careprovider. What can I expect after the procedure? After the procedure, it is common to have: Tiredness. Forgetfulness about what happened after the procedure. Impaired judgment for important decisions. Nausea or vomiting. Some difficulty with balance. Follow these instructions at home: For the time period you were told by your health care provider:     Rest as needed. Do not participate in activities where you could fall or become injured. Do not drive or use machinery. Do not drink alcohol. Do not take sleeping pills or medicines that cause drowsiness. Do not make important decisions or sign legal documents. Do not take care of children on your  own. Eating and drinking Follow the diet that is recommended by your health care provider. Drink enough fluid to keep your urine pale yellow. If you vomit: Drink water, juice, or soup when you can drink without vomiting. Make sure you have little or no nausea before eating solid foods. General instructions Have a responsible adult stay with you for the time you are told. It is important to have someone help care for you until you are awake and alert. Take over-the-counter and prescription medicines only as told by your health care provider. If you have sleep apnea, surgery and certain medicines can increase your risk for breathing problems. Follow instructions from your health care provider about wearing your sleep device: Anytime you are sleeping, including during daytime naps. While taking prescription pain medicines, sleeping medicines, or medicines that make you drowsy. Avoid smoking. Keep all follow-up visits as told by your health care provider. This is important. Contact a health care provider if: You keep feeling nauseous or you keep vomiting. You feel light-headed. You are still sleepy or having trouble with balance after 24 hours. You develop a rash. You have a fever. You have redness or swelling around the IV site. Get help right away if: You have trouble breathing. You have new-onset confusion at home. Summary For several hours after your procedure, you may feel tired. You may also be forgetful and have poor judgment. Have a responsible adult stay with you for the time you are told. It is important to have someone help care for you until you are awake and alert. Rest as told. Do not drive or operate machinery. Do not drink alcohol or take sleeping pills. Get help right away if you have trouble breathing, or if you suddenly become confused. This information is not intended to replace advice given to you by your health care provider. Make sure you discuss any questions you  have with your healthcare provider. Document Revised: 11/16/2019 Document Reviewed: 02/02/2019 Elsevier Patient Education  2022 [employer].

## 2020-09-05 ENCOUNTER — Encounter (HOSPITAL_COMMUNITY)
Admission: RE | Admit: 2020-09-05 | Discharge: 2020-09-05 | Disposition: A | Discharge status: Home / Self Care | Payer: Medicare HMO | Source: Ambulatory Visit | Attending: Internal Medicine | Admitting: Internal Medicine

## 2020-09-05 ENCOUNTER — Other Ambulatory Visit (HOSPITAL_COMMUNITY): Payer: Medicare HMO

## 2020-09-05 ENCOUNTER — Telehealth: Payer: Self-pay | Admitting: *Deleted

## 2020-09-05 DIAGNOSIS — K7460 Unspecified cirrhosis of liver: Secondary | ICD-10-CM

## 2020-09-05 NOTE — Telephone Encounter (Signed)
Called pt sister and made aware pre-op has been rescheduled to 6/24 at 8:30am. She voiced understanding

## 2020-09-05 NOTE — Telephone Encounter (Signed)
-----   Message from Encarnacion Chu, RN sent at 09/05/2020  1:22 PM EDT ----- Regarding: no show. It s me again! [name] did not show for his PAT this afternoon.

## 2020-09-05 NOTE — Telephone Encounter (Signed)
Called pt and spoke with GF. She told me to call patient sister at [phone].  Called pt sister and spoke with pt. He wants to reschedule his pre-op appt so he can have procedure done Monday.  Called endo and [facility] to see if pre-op can be done tomorrow. Sister advised me to leave her message as she is leaving for work now.

## 2020-09-06 ENCOUNTER — Other Ambulatory Visit: Payer: Self-pay

## 2020-09-06 ENCOUNTER — Encounter (HOSPITAL_COMMUNITY): Payer: Self-pay

## 2020-09-06 ENCOUNTER — Encounter (HOSPITAL_COMMUNITY)
Admission: RE | Admit: 2020-09-06 | Discharge: 2020-09-06 | Disposition: A | Discharge status: Home / Self Care | Payer: Medicare HMO | Source: Ambulatory Visit | Attending: Internal Medicine | Admitting: Internal Medicine

## 2020-09-06 DIAGNOSIS — I509 Heart failure, unspecified: Secondary | ICD-10-CM | POA: Diagnosis not present

## 2020-09-06 DIAGNOSIS — I5033 Acute on chronic diastolic (congestive) heart failure: Secondary | ICD-10-CM | POA: Diagnosis not present

## 2020-09-06 DIAGNOSIS — K639 Disease of intestine, unspecified: Secondary | ICD-10-CM | POA: Diagnosis not present

## 2020-09-06 DIAGNOSIS — R609 Edema, unspecified: Secondary | ICD-10-CM | POA: Diagnosis not present

## 2020-09-06 DIAGNOSIS — Z01812 Encounter for preprocedural laboratory examination: Secondary | ICD-10-CM | POA: Diagnosis not present

## 2020-09-06 DIAGNOSIS — J9601 Acute respiratory failure with hypoxia: Secondary | ICD-10-CM | POA: Diagnosis not present

## 2020-09-06 DIAGNOSIS — K7460 Unspecified cirrhosis of liver: Secondary | ICD-10-CM

## 2020-09-06 LAB — CBC WITH DIFFERENTIAL/PLATELET
Abs Immature Granulocytes: 0.01 10*3/uL (ref 0.00–0.07)
Basophils Absolute: 0.1 10*3/uL (ref 0.0–0.1)
Basophils Relative: 1 %
Eosinophils Absolute: 0.5 10*3/uL (ref 0.0–0.5)
Eosinophils Relative: 9 %
HCT: 45.7 % (ref 39.0–52.0)
Hemoglobin: 15.5 g/dL (ref 13.0–17.0)
Immature Granulocytes: 0 %
Lymphocytes Relative: 42 %
Lymphs Abs: 2.4 10*3/uL (ref 0.7–4.0)
MCH: 36.2 pg — ABNORMAL HIGH (ref 26.0–34.0)
MCHC: 33.9 g/dL (ref 30.0–36.0)
MCV: 106.8 fL — ABNORMAL HIGH (ref 80.0–100.0)
Monocytes Absolute: 0.7 10*3/uL (ref 0.1–1.0)
Monocytes Relative: 12 %
Neutro Abs: 2.1 10*3/uL (ref 1.7–7.7)
Neutrophils Relative %: 36 %
Platelets: 87 10*3/uL — ABNORMAL LOW (ref 150–400)
RBC: 4.28 MIL/uL (ref 4.22–5.81)
RDW: 13.6 % (ref 11.5–15.5)
WBC: 5.8 10*3/uL (ref 4.0–10.5)
nRBC: 0 % (ref 0.0–0.2)

## 2020-09-06 LAB — BASIC METABOLIC PANEL
Anion gap: 9 (ref 5–15)
BUN: 11 mg/dL (ref 6–20)
CO2: 29 mmol/L (ref 22–32)
Calcium: 8.9 mg/dL (ref 8.9–10.3)
Chloride: 100 mmol/L (ref 98–111)
Creatinine, Ser: 0.79 mg/dL (ref 0.61–1.24)
GFR, Estimated: >60 mL/min (ref >60)
Glucose, Bld: 95 mg/dL (ref 70–99)
Potassium: 3.4 mmol/L — ABNORMAL LOW (ref 3.5–5.1)
Sodium: 138 mmol/L (ref 135–145)

## 2020-09-06 LAB — HEPATIC FUNCTION PANEL
ALT: 34 U/L (ref 0–44)
AST: 34 U/L (ref 15–41)
Albumin: 3.7 g/dL (ref 3.5–5.0)
Alkaline Phosphatase: 92 U/L (ref 38–126)
Bilirubin, Direct: 0.1 mg/dL (ref 0.0–0.2)
Indirect Bilirubin: 0.8 mg/dL (ref 0.3–0.9)
Total Bilirubin: 0.9 mg/dL (ref 0.3–1.2)
Total Protein: 7.6 g/dL (ref 6.5–8.1)

## 2020-09-09 ENCOUNTER — Ambulatory Visit (HOSPITAL_COMMUNITY): Payer: Medicare HMO | Admitting: Anesthesiology

## 2020-09-09 ENCOUNTER — Ambulatory Visit (HOSPITAL_COMMUNITY)
Admission: RE | Admit: 2020-09-09 | Discharge: 2020-09-09 | Disposition: A | Discharge status: Home / Self Care | Payer: Medicare HMO | Attending: Internal Medicine | Admitting: Internal Medicine

## 2020-09-09 ENCOUNTER — Encounter (HOSPITAL_COMMUNITY)
Admission: RE | Disposition: A | Discharge status: Home / Self Care | Payer: Self-pay | Source: Home / Self Care | Attending: Internal Medicine

## 2020-09-09 DIAGNOSIS — R948 Abnormal results of function studies of other organs and systems: Secondary | ICD-10-CM | POA: Diagnosis present

## 2020-09-09 DIAGNOSIS — K3189 Other diseases of stomach and duodenum: Secondary | ICD-10-CM | POA: Insufficient documentation

## 2020-09-09 DIAGNOSIS — K7460 Unspecified cirrhosis of liver: Secondary | ICD-10-CM | POA: Diagnosis not present

## 2020-09-09 DIAGNOSIS — I8510 Secondary esophageal varices without bleeding: Secondary | ICD-10-CM | POA: Insufficient documentation

## 2020-09-09 DIAGNOSIS — Q2739 Arteriovenous malformation, other site: Secondary | ICD-10-CM | POA: Insufficient documentation

## 2020-09-09 DIAGNOSIS — K7030 Alcoholic cirrhosis of liver without ascites: Secondary | ICD-10-CM | POA: Diagnosis not present

## 2020-09-09 DIAGNOSIS — D124 Benign neoplasm of descending colon: Secondary | ICD-10-CM | POA: Diagnosis not present

## 2020-09-09 DIAGNOSIS — K766 Portal hypertension: Secondary | ICD-10-CM | POA: Diagnosis not present

## 2020-09-09 DIAGNOSIS — D122 Benign neoplasm of ascending colon: Secondary | ICD-10-CM | POA: Diagnosis not present

## 2020-09-09 DIAGNOSIS — K635 Polyp of colon: Secondary | ICD-10-CM | POA: Diagnosis not present

## 2020-09-09 HISTORY — PX: COLONOSCOPY WITH PROPOFOL: SHX5780

## 2020-09-09 HISTORY — PX: POLYPECTOMY: SHX5525

## 2020-09-09 HISTORY — PX: BIOPSY: SHX5522

## 2020-09-09 HISTORY — PX: ESOPHAGOGASTRODUODENOSCOPY (EGD) WITH PROPOFOL: SHX5813

## 2020-09-09 SURGERY — COLONOSCOPY WITH PROPOFOL
Anesthesia: General

## 2020-09-09 MED ORDER — LIDOCAINE HCL (CARDIAC) PF 50 MG/5ML IV SOSY
PREFILLED_SYRINGE | INTRAVENOUS | Status: DC | PRN
  Administered 2020-09-09: 50 mg via INTRAVENOUS

## 2020-09-09 MED ORDER — PROPOFOL 10 MG/ML IV BOLUS
INTRAVENOUS | Status: DC | PRN
  Administered 2020-09-09 (×4): 50 mg via INTRAVENOUS
  Administered 2020-09-09: 100 mg via INTRAVENOUS
  Administered 2020-09-09: 20 mg via INTRAVENOUS

## 2020-09-09 MED ORDER — LACTATED RINGERS IV SOLN: INTRAVENOUS | Status: DC

## 2020-09-09 MED ORDER — PROPOFOL 500 MG/50ML IV EMUL
INTRAVENOUS | Status: DC | PRN
  Administered 2020-09-09 (×2): 100 ug/kg/min via INTRAVENOUS

## 2020-09-09 MED ORDER — STERILE WATER FOR IRRIGATION IR SOLN
Status: DC | PRN
  Administered 2020-09-09: 5 mL

## 2020-09-09 NOTE — Op Note (Addendum)
[facility] Patient Name: William Barrett Procedure Date: 09/09/2020 10:44 AM MRN: [id number] Date of Birth: [birth year] Attending MD: Norvel Richards , MD CSN: [id number] Age: 60 Admit Type: Outpatient Procedure:                Colonoscopy Indications:              Abnormal CT of the GI tract Providers:                Norvel Richards, MD, Lurline Del, RN, Randa Spike, Technician Referring MD:              Medicines:                Propofol per Anesthesia Complications:            No immediate complications. Estimated Blood Loss:     Estimated blood loss was minimal. Procedure:                Pre-Anesthesia Assessment:                           - Prior to the procedure, a History and Physical                            was performed, and patient medications and                            allergies were reviewed. The patient's tolerance of                            previous anesthesia was also reviewed. The risks                            and benefits of the procedure and the sedation                            options and risks were discussed with the patient.                            All questions were answered, and informed consent                            was obtained. Prior Anticoagulants: The patient has                            taken no previous anticoagulant or antiplatelet                            agents. ASA Grade Assessment: III - A patient with                            severe systemic disease. After reviewing the risks  and benefits, the patient was deemed in                            satisfactory condition to undergo the procedure.                           After obtaining informed consent, the colonoscope                            was passed under direct vision. Throughout the                            procedure, the patient's blood pressure, pulse, and                            oxygen  saturations were monitored continuously. The                            CF-[id number] ([id number]) scope was introduced through                            the anus and advanced to the the cecum, identified                            by appendiceal orifice and ileocecal valve. The                            colonoscopy was performed without difficulty. The                            patient tolerated the procedure well. The quality                            of the bowel preparation was adequate. Scope In: 10:48:09 AM Scope Out: 11:00:38 AM Scope Withdrawal Time: 0 hours 10 minutes 33 seconds  Total Procedure Duration: 0 hours 12 minutes 29 seconds  Findings:      The perianal and digital rectal examinations were normal.      Three semi-pedunculated polyps were found in the descending colon and       ascending colon. The polyps were 5 to 7 mm in size. These polyps were       removed with a cold snare. Resection and retrieval were complete.       Estimated blood loss was minimal.      The exam was otherwise without abnormality on direct and retroflexion       views. 1cm innocent appearing cecal AVM. Impression:               - Three 5 to 7 mm polyps in the descending colon                            and in the ascending colon, removed with a cold                            snare. Resected and  retrieved. Cecal AVM.                           - The examination was otherwise normal on direct                            and retroflexion views. Moderate Sedation:      Moderate (conscious) sedation was personally administered by an       anesthesia professional. The following parameters were monitored: oxygen       saturation, heart rate, blood pressure, respiratory rate, EKG, adequacy       of pulmonary ventilation, and response to care. Recommendation:           - Patient has a contact number available for                            emergencies. The signs and symptoms of potential                             delayed complications were discussed with the                            patient. Return to normal activities tomorrow.                            Written discharge instructions were provided to the                            patient.                           - Resume previous diet.                           - Continue present medications.                           - Repeat colonoscopy date to be determined after                            pending pathology results are reviewed for                            surveillance based on pathology results.                           - Return to GI office in 1 month. See EGD report.. Procedure Code(s):        --- Professional ---                           [phone], Colonoscopy, flexible; with removal of                            tumor(s), polyp(s), or other lesion(s) by snare  technique Diagnosis Code(s):        --- Professional ---                           K63.5, Polyp of colon                           R93.3, Abnormal findings on diagnostic imaging of                            other parts of digestive tract CPT copyright 2019 American Medical Association. All rights reserved. The codes documented in this report are preliminary and upon coder review may  be revised to meet current compliance requirements. Cristopher Estimable. Valentina Alcoser, MD Norvel Richards, MD 09/09/2020 11:17:05 AM This report has been signed electronically. Number of Addenda: 0

## 2020-09-09 NOTE — Op Note (Signed)
[facility] Patient Name: William Barrett Procedure Date: 09/09/2020 10:15 AM MRN: [id number] Date of Birth: [birth year] Attending MD: Norvel Richards , MD CSN: [id number] Age: 60 Admit Type: Outpatient Procedure:                Upper GI endoscopy Indications:              Cirrhosis with suspected esophageal varices Providers:                Norvel Richards, MD, Lurline Del, RN, Randa Spike, Technician Referring MD:              Medicines:                Propofol per Anesthesia Complications:            No immediate complications. Estimated Blood Loss:     Estimated blood loss was minimal. Procedure:                Pre-Anesthesia Assessment:                           - Prior to the procedure, a History and Physical                            was performed, and patient medications and                            allergies were reviewed. The patient's tolerance of                            previous anesthesia was also reviewed. The risks                            and benefits of the procedure and the sedation                            options and risks were discussed with the patient.                            All questions were answered, and informed consent                            was obtained. Prior Anticoagulants: The patient has                            taken no previous anticoagulant or antiplatelet                            agents. ASA Grade Assessment: III - A patient with                            severe systemic disease. After reviewing the risks  and benefits, the patient was deemed in                            satisfactory condition to undergo the procedure.                           After obtaining informed consent, the endoscope was                            passed under direct vision. Throughout the                            procedure, the patient's blood pressure, pulse, and                             oxygen saturations were monitored continuously. The                            [id number]-[id number] ([id number]) scope was introduced through the                            mouth, and advanced to the second part of duodenum.                            The upper GI endoscopy was accomplished without                            difficulty. The patient tolerated the procedure                            well. Scope In: 10:36:37 AM Scope Out: 10:43:11 AM Total Procedure Duration: 0 hours 6 minutes 34 seconds  Findings:      Three 5 cm, of grade 1 esophageal varices overlying mucosa appeared       normal. Antral prepyloric edema and erosions. No ulcer or infiltrating       process seen. Mild diffuse gastric mucosal changes consistent with       portal gastropathy. Patent pylorus. Normal first and second portion of       the duodenum. Biopsies of the abnormal antral mucosa taken for       histologic study.      The duodenal bulb and second portion of the duodenum were normal. Impression:               Grade 1 esophageal varices. Mild portal                            gastropathy. Prepyloric mucosal edema and erosion.                            Status post biopsy.                           - Moderate Sedation:      Moderate (conscious) sedation was personally administered by an       anesthesia professional. The following parameters were monitored:  oxygen       saturation, heart rate, blood pressure, respiratory rate, EKG, adequacy       of pulmonary ventilation, and response to care. Recommendation:           - Patient has a contact number available for                            emergencies. The signs and symptoms of potential                            delayed complications were discussed with the                            patient. Return to normal activities tomorrow.                            Written discharge instructions were provided to the                            patient.                            - Resume previous diet.                           - Continue present medications.                           - Return to my office in 1 month. Will decide about                            initiation of a nonselective beta-blocker when                            patient returns for his follow-up appointment. See                            colonoscopy report. Procedure Code(s):        --- Professional ---                           [phone], Esophagogastroduodenoscopy, flexible,                            transoral; diagnostic, including collection of                            specimen(s) by brushing or washing, when performed                            (separate procedure) Diagnosis Code(s):        --- Professional ---                           K74.60, Unspecified cirrhosis of liver CPT copyright 2019 American Medical Association. All rights reserved. The codes documented in this report are preliminary  and upon coder review may  be revised to meet current compliance requirements. Cristopher Estimable. Jaculin Rasmus, MD Norvel Richards, MD 09/09/2020 11:13:40 AM This report has been signed electronically. Number of Addenda: 0

## 2020-09-09 NOTE — Transfer of Care (Signed)
Immediate Anesthesia Transfer of Care Note  Patient: William Barrett  Procedure(s) Performed: COLONOSCOPY WITH PROPOFOL ESOPHAGOGASTRODUODENOSCOPY (EGD) WITH PROPOFOL POLYPECTOMY  Patient Location: Short Stay  Anesthesia Type:General  Level of Consciousness: awake and patient cooperative  Airway & Oxygen Therapy: Patient Spontanous Breathing  Post-op Assessment: Report given to RN and Post -op Vital signs reviewed and stable  Post vital signs: Reviewed and stable  Last Vitals:  Vitals Value Taken Time  BP    Temp    Pulse    Resp    SpO2      Last Pain:  Vitals:   09/09/20 0952  TempSrc: Oral  PainSc: 0-No pain      Patients Stated Pain Goal: 4 (91/63/84 [id number])  Complications: No notable events documented.

## 2020-09-09 NOTE — Discharge Instructions (Signed)
Colonoscopy Discharge Instructions  Read the instructions outlined below and refer to this sheet in the next few weeks. These discharge instructions provide you with general information on caring for yourself after you leave the hospital. Your doctor may also give you specific instructions. While your treatment has been planned according to the most current medical practices available, unavoidable complications occasionally occur. If you have any problems or questions after discharge, call Dr. [name] at [phone]. ACTIVITY You may resume your regular activity, but move at a slower pace for the next 24 hours.  Take frequent rest periods for the next 24 hours.  Walking will help get rid of the air and reduce the bloated feeling in your belly (abdomen).  No driving for 24 hours (because of the medicine (anesthesia) used during the test).   Do not sign any important legal documents or operate any machinery for 24 hours (because of the anesthesia used during the test).  NUTRITION Drink plenty of fluids.  You may resume your normal diet as instructed by your doctor.  Begin with a light meal and progress to your normal diet. Heavy or fried foods are harder to digest and may make you feel sick to your stomach (nauseated).  Avoid alcoholic beverages for 24 hours or as instructed.  MEDICATIONS You may resume your normal medications unless your doctor tells you otherwise.  WHAT YOU CAN EXPECT TODAY Some feelings of bloating in the abdomen.  Passage of more gas than usual.  Spotting of blood in your stool or on the toilet paper.  IF YOU HAD POLYPS REMOVED DURING THE COLONOSCOPY: No aspirin products for 7 days or as instructed.  No alcohol for 7 days or as instructed.  Eat a soft diet for the next 24 hours.  FINDING OUT THE RESULTS OF YOUR TEST Not all test results are available during your visit. If your test results are not back during the visit, make an appointment with your caregiver to find out the  results. Do not assume everything is normal if you have not heard from your caregiver or the medical facility. It is important for you to follow up on all of your test results.  SEEK IMMEDIATE MEDICAL ATTENTION IF: You have more than a spotting of blood in your stool.  Your belly is swollen (abdominal distention).  You are nauseated or vomiting.  You have a temperature over 101.  You have abdominal pain or discomfort that is severe or gets worse throughout the day.   EGD Discharge instructions Please read the instructions outlined below and refer to this sheet in the next few weeks. These discharge instructions provide you with general information on caring for yourself after you leave the hospital. Your doctor may also give you specific instructions. While your treatment has been planned according to the most current medical practices available, unavoidable complications occasionally occur. If you have any problems or questions after discharge, please call your doctor. ACTIVITY You may resume your regular activity but move at a slower pace for the next 24 hours.  Take frequent rest periods for the next 24 hours.  Walking will help expel (get rid of) the air and reduce the bloated feeling in your abdomen.  No driving for 24 hours (because of the anesthesia (medicine) used during the test).  You may shower.  Do not sign any important legal documents or operate any machinery for 24 hours (because of the anesthesia used during the test).  NUTRITION Drink plenty of fluids.  You may  resume your normal diet.  Begin with a light meal and progress to your normal diet.  Avoid alcoholic beverages for 24 hours or as instructed by your caregiver.  MEDICATIONS You may resume your normal medications unless your caregiver tells you otherwise.  WHAT YOU CAN EXPECT TODAY You may experience abdominal discomfort such as a feeling of fullness or "gas" pains.  FOLLOW-UP Your doctor will discuss the results of  your test with you.  SEEK IMMEDIATE MEDICAL ATTENTION IF ANY OF THE FOLLOWING OCCUR: Excessive nausea (feeling sick to your stomach) and/or vomiting.  Severe abdominal pain and distention (swelling).  Trouble swallowing.  Temperature over 101 F (37.8 C).  Rectal bleeding or vomiting of blood.    3 polyps removed from your colon today  Stomach was inflamed and biopsies were taken  Further recommendations to follow pending review of pathology report  At patient request, I called Mardene Celeste at [phone] -reviewed results.

## 2020-09-09 NOTE — Anesthesia Preprocedure Evaluation (Addendum)
Anesthesia Evaluation  Patient identified by MRN, date of birth, ID band Patient awake    Reviewed: Allergy & Precautions, NPO status , Patient's Chart, lab work & pertinent test results, reviewed documented beta blocker date and time   History of Anesthesia Complications Negative for: history of anesthetic complications  Airway Mallampati: II  TM Distance: >3 FB Neck ROM: Full    Dental  (+) Dental Advisory Given, Missing, Poor Dentition, Loose   Pulmonary shortness of breath and with exertion, Current SmokerPatient did not abstain from smoking.,    Pulmonary exam normal breath sounds clear to auscultation       Cardiovascular Exercise Tolerance: Poor hypertension, Pt. on medications and Pt. on home beta blockers +CHF  Normal cardiovascular exam+ Valvular Problems/Murmurs  Rhythm:Regular Rate:Normal  1. Left ventricular ejection fraction, by estimation, is >75%. The left ventricle has hyperdynamic function. The left ventricle has no regional wall motion abnormalities. There is severe left ventricular hypertrophy.  Left ventricular diastolic parameters were normal.  2. Right ventricular systolic function is mildly reduced. The right ventricular size is mildly enlarged.  3. Right atrial size was mildly dilated.  4. The aortic valve is tricuspid. Aortic valve regurgitation is not visualized. Mild aortic valve sclerosis is present, with no evidence of aortic valve stenosis.  5. The inferior vena cava is normal in size with greater than 50%  respiratory variability, suggesting right atrial pressure of 3 mmHg.    Neuro/Psych PSYCHIATRIC DISORDERS    GI/Hepatic PUD, (+) Cirrhosis     substance abuse  alcohol use,   Endo/Other  negative endocrine ROS  Renal/GU negative Renal ROS     Musculoskeletal   Abdominal   Peds  Hematology  (+) anemia ,   Anesthesia Other Findings   Reproductive/Obstetrics                              Anesthesia Physical Anesthesia Plan  ASA: 4  Anesthesia Plan: General   Post-op Pain Management:    Induction: Intravenous  PONV Risk Score and Plan: Propofol infusion  Airway Management Planned: Nasal Cannula and Natural Airway  Additional Equipment:   Intra-op Plan:   Post-operative Plan:   Informed Consent: I have reviewed the patients History and Physical, chart, labs and discussed the procedure including the risks, benefits and alternatives for the proposed anesthesia with the patient or authorized representative who has indicated his/her understanding and acceptance.     Dental advisory given  Plan Discussed with: CRNA and Surgeon  Anesthesia Plan Comments:        Anesthesia Quick Evaluation

## 2020-09-09 NOTE — H&P (Signed)
@LOGO @   Primary Care Physician:  [name], MD Primary Gastroenterologist:  Dr. [name]  Pre-Procedure History & Physical: HPI:  William Barrett is a 60 y.o. male here for esophageal variceal screening.  History of EtOH related cirrhosis.  Also, here for diagnostic colonoscopy-abnormal colon imaging on recent CT.  Past Medical History:  Diagnosis Date   Alcoholism ([place])    Anemia    Cardiac murmur    Has been evaluated by cardiology with echocardiogram in 2014 without significant findings   Hypertension    Tachycardia, unspecified    Thrombocytopenia ([place])    Undiagnosed cardiac murmurs     Past Surgical History:  Procedure Laterality Date   ESOPHAGOGASTRODUODENOSCOPY  09/21/2008   RMR: Distal esophageal erosions consistent with erosive reflux esophagitis.Two benign-appearing prepyloric ulcers, otherwise unremarkable/small HH   NO PAST SURGERIES      Prior to Admission medications   Medication Sig Start Date End Date Taking? Authorizing Provider  furosemide (LASIX) 40 MG tablet Take 1 tablet (40 mg total) by mouth daily. Patient taking differently: Take 40 mg by mouth in the morning. 06/07/20  Yes Tat, Shanon Brow, MD  metoprolol succinate (TOPROL-XL) 25 MG 24 hr tablet Take 25 mg by mouth 2 (two) times daily. 06/20/20  Yes [provider]  polyethylene glycol-electrolytes (NULYTELY) 420 g solution As directed 06/27/20  Yes Laquilla Dault, Cristopher Estimable, MD    Allergies as of 06/27/2020   (No Known Allergies)    Family History  Problem Relation Age of Onset   Hypertension Mother    Hypertension Father    Liver disease Neg Hx    Colon cancer Neg Hx     Social History   Socioeconomic History   Marital status: Single    Spouse name: Not on file   Number of children: 0   Years of education: Not on file   Highest education level: Not on file  Occupational History   Occupation: disability  Tobacco Use   Smoking status: Every Day    Packs/day: 3.00    Years: 25.00    Pack  years: 75.00    Types: Cigarettes, Cigars   Smokeless tobacco: Never  Substance and Sexual Activity   Alcohol use: Yes    Comment: 2 40oz beers daily, patient quit drinking March 2022    Drug use: No   Sexual activity: Not Currently  Other Topics Concern   Not on file  Social History Narrative   Not on file   Social Determinants of Health   Financial Resource Strain: Not on file  Food Insecurity: Not on file  Transportation Needs: Not on file  Physical Activity: Not on file  Stress: Not on file  Social Connections: Not on file  Intimate Partner Violence: Not on file    Review of Systems: See HPI, otherwise negative ROS  Physical Exam: BP (!) 138/92   Pulse 85   Resp 18   SpO2 94%  General:   Alert,  Well-developed, well-nourished, pleasant and cooperative in NAD Neck:  Supple; no masses or thyromegaly. No significant cervical adenopathy. Lungs:  Clear throughout to auscultation.   No wheezes, crackles, or rhonchi. No acute distress. Heart:  Regular rate and rhythm; no murmurs, clicks, rubs,  or gallops. Abdomen: Non-distended, normal bowel sounds.  Soft and nontender without appreciable mass or hepatosplenomegaly.  Pulses:  Normal pulses noted. Extremities:  Without clubbing or edema.  Impression/Plan: 60 year old gentleman EtOH related cirrhosis here for screening EGD and diagnostic colonoscopy given abnormal appearing  colon on CT. The risks, benefits, limitations, imponderables and alternatives regarding both EGD and colonoscopy have been reviewed with the patient. Questions have been answered. All parties agreeable.       Notice: This dictation was prepared with Dragon dictation along with smaller phrase technology. Any transcriptional errors that result from this process are unintentional and may not be corrected upon review.

## 2020-09-09 NOTE — Anesthesia Postprocedure Evaluation (Signed)
Anesthesia Post Note  Patient: [name]  Procedure(s) Performed: COLONOSCOPY WITH PROPOFOL ESOPHAGOGASTRODUODENOSCOPY (EGD) WITH PROPOFOL POLYPECTOMY BIOPSY  Patient location during evaluation: Phase II Anesthesia Type: General Level of consciousness: awake and alert and oriented Pain management: pain level controlled Vital Signs Assessment: post-procedure vital signs reviewed and stable Respiratory status: spontaneous breathing and respiratory function stable Cardiovascular status: blood pressure returned to baseline and stable Postop Assessment: no apparent nausea or vomiting Anesthetic complications: no   No notable events documented.   Last Vitals:  Vitals:   09/09/20 0952 09/09/20 1111  BP: (!) 138/92 113/85  Pulse: 85 87  Resp: 18 20  Temp:  36.6 C  SpO2: 94% 96%    Last Pain:  Vitals:   09/09/20 1111  TempSrc: Oral  PainSc: 0-No pain                 Severo Beber C Tammi Boulier

## 2020-09-10 ENCOUNTER — Other Ambulatory Visit: Payer: Self-pay | Admitting: *Deleted

## 2020-09-10 DIAGNOSIS — K639 Disease of intestine, unspecified: Secondary | ICD-10-CM | POA: Diagnosis not present

## 2020-09-10 DIAGNOSIS — R609 Edema, unspecified: Secondary | ICD-10-CM | POA: Diagnosis not present

## 2020-09-10 DIAGNOSIS — J9601 Acute respiratory failure with hypoxia: Secondary | ICD-10-CM | POA: Diagnosis not present

## 2020-09-10 DIAGNOSIS — I5033 Acute on chronic diastolic (congestive) heart failure: Secondary | ICD-10-CM | POA: Diagnosis not present

## 2020-09-10 DIAGNOSIS — I509 Heart failure, unspecified: Secondary | ICD-10-CM | POA: Diagnosis not present

## 2020-09-10 MED ORDER — POTASSIUM CHLORIDE CRYS ER 20 MEQ PO TBCR: 20.0000 meq | EXTENDED_RELEASE_TABLET | Freq: Every day | ORAL | 11 refills | Status: DC

## 2020-09-13 ENCOUNTER — Encounter (HOSPITAL_COMMUNITY): Payer: Self-pay | Admitting: Internal Medicine

## 2020-09-16 ENCOUNTER — Encounter: Payer: Self-pay | Admitting: Internal Medicine

## 2020-10-06 DIAGNOSIS — K639 Disease of intestine, unspecified: Secondary | ICD-10-CM | POA: Diagnosis not present

## 2020-10-06 DIAGNOSIS — J9601 Acute respiratory failure with hypoxia: Secondary | ICD-10-CM | POA: Diagnosis not present

## 2020-10-06 DIAGNOSIS — I5033 Acute on chronic diastolic (congestive) heart failure: Secondary | ICD-10-CM | POA: Diagnosis not present

## 2020-10-06 DIAGNOSIS — I509 Heart failure, unspecified: Secondary | ICD-10-CM | POA: Diagnosis not present

## 2020-10-06 DIAGNOSIS — R609 Edema, unspecified: Secondary | ICD-10-CM | POA: Diagnosis not present

## 2020-10-10 DIAGNOSIS — R609 Edema, unspecified: Secondary | ICD-10-CM | POA: Diagnosis not present

## 2020-10-10 DIAGNOSIS — K639 Disease of intestine, unspecified: Secondary | ICD-10-CM | POA: Diagnosis not present

## 2020-10-10 DIAGNOSIS — I5033 Acute on chronic diastolic (congestive) heart failure: Secondary | ICD-10-CM | POA: Diagnosis not present

## 2020-10-10 DIAGNOSIS — J9601 Acute respiratory failure with hypoxia: Secondary | ICD-10-CM | POA: Diagnosis not present

## 2020-10-10 DIAGNOSIS — I509 Heart failure, unspecified: Secondary | ICD-10-CM | POA: Diagnosis not present

## 2020-10-14 DIAGNOSIS — D696 Thrombocytopenia, unspecified: Secondary | ICD-10-CM | POA: Diagnosis not present

## 2020-11-06 DIAGNOSIS — R609 Edema, unspecified: Secondary | ICD-10-CM | POA: Diagnosis not present

## 2020-11-06 DIAGNOSIS — I5033 Acute on chronic diastolic (congestive) heart failure: Secondary | ICD-10-CM | POA: Diagnosis not present

## 2020-11-06 DIAGNOSIS — I509 Heart failure, unspecified: Secondary | ICD-10-CM | POA: Diagnosis not present

## 2020-11-06 DIAGNOSIS — J9601 Acute respiratory failure with hypoxia: Secondary | ICD-10-CM | POA: Diagnosis not present

## 2020-11-06 DIAGNOSIS — K639 Disease of intestine, unspecified: Secondary | ICD-10-CM | POA: Diagnosis not present

## 2020-11-10 DIAGNOSIS — I5033 Acute on chronic diastolic (congestive) heart failure: Secondary | ICD-10-CM | POA: Diagnosis not present

## 2020-11-10 DIAGNOSIS — R609 Edema, unspecified: Secondary | ICD-10-CM | POA: Diagnosis not present

## 2020-11-10 DIAGNOSIS — I509 Heart failure, unspecified: Secondary | ICD-10-CM | POA: Diagnosis not present

## 2020-11-10 DIAGNOSIS — J9601 Acute respiratory failure with hypoxia: Secondary | ICD-10-CM | POA: Diagnosis not present

## 2020-11-10 DIAGNOSIS — K639 Disease of intestine, unspecified: Secondary | ICD-10-CM | POA: Diagnosis not present

## 2020-12-07 DIAGNOSIS — I509 Heart failure, unspecified: Secondary | ICD-10-CM | POA: Diagnosis not present

## 2020-12-07 DIAGNOSIS — I5033 Acute on chronic diastolic (congestive) heart failure: Secondary | ICD-10-CM | POA: Diagnosis not present

## 2020-12-07 DIAGNOSIS — K639 Disease of intestine, unspecified: Secondary | ICD-10-CM | POA: Diagnosis not present

## 2020-12-07 DIAGNOSIS — R609 Edema, unspecified: Secondary | ICD-10-CM | POA: Diagnosis not present

## 2020-12-07 DIAGNOSIS — J9601 Acute respiratory failure with hypoxia: Secondary | ICD-10-CM | POA: Diagnosis not present

## 2020-12-08 NOTE — Progress Notes (Signed)
CARDIOLOGY CONSULT NOTE       Patient ID: William Barrett MRN: [id number] DOB/AGE: 60-Dec-1962 60 y.o.  Admit date: (Not on file) Referring Physician: [name] Primary Physician: [name], MD Primary Cardiologist: New Reason for Consultation: CHF  Active Problems:   * No active hospital problems. *   HPI:  60 y.o. referred by Dr [name] for CHF.  History of ETOH abuse, HTN, Anemia. History of murmur Noted echo done 05/27/20 showed EF >75% severe LVH with normal diastolic parameters Mild RVE/RV decreased function Mild AV sclerosis normal MV and no evidence of pulmonary HTN.  He has cirrhosis EGD 09/09/20 showed mild grade on varices. Followed by Dr [name]  CXR 06/01/20 suggested chronic CE with interstitial lung changes  6 months ago his BNP was only 152   He was seen by Dr [name] in 2014 noted huge beer consumption since age 60 and smoking up to 3 ppd since age 27 was started on beta blocker for tachycardia  He says he has not had a drink in 5 months He is on disability for axniety He is overweight and sedentary Does not complain of PND/Orthopnea, or chest pain He still smokes over 2 ppd   ROS All other systems reviewed and negative except as noted above  Past Medical History:  Diagnosis Date   Alcoholism ([place])    Anemia    Cardiac murmur    Has been evaluated by cardiology with echocardiogram in 2014 without significant findings   Hypertension    Tachycardia, unspecified    Thrombocytopenia ([facility])    Undiagnosed cardiac murmurs     Family History  Problem Relation Age of Onset   Hypertension Mother    Hypertension Father    Liver disease Neg Hx    Colon cancer Neg Hx     Social History   Socioeconomic History   Marital status: Single    Spouse name: Not on file   Number of children: 0   Years of education: Not on file   Highest education level: Not on file  Occupational History   Occupation: disability  Tobacco Use   Smoking status: Every Day    Packs/day: 3.00     Years: 25.00    Pack years: 75.00    Types: Cigarettes, Cigars   Smokeless tobacco: Never  Vaping Use   Vaping Use: Never used  Substance and Sexual Activity   Alcohol use: Yes    Comment: 2 40oz beers daily, patient quit drinking March 2022    Drug use: No   Sexual activity: Not Currently  Other Topics Concern   Not on file  Social History Narrative   Not on file   Social Determinants of Health   Financial Resource Strain: Not on file  Food Insecurity: Not on file  Transportation Needs: Not on file  Physical Activity: Not on file  Stress: Not on file  Social Connections: Not on file  Intimate Partner Violence: Not on file    Past Surgical History:  Procedure Laterality Date   BIOPSY  09/09/2020   Procedure: BIOPSY;  Surgeon: [name], MD;  [facility];  Service: Endoscopy;;  gastric   COLONOSCOPY WITH PROPOFOL N/A 09/09/2020   Procedure: COLONOSCOPY WITH PROPOFOL;  Surgeon: [name], MD;  Location: [facility];  Service: Endoscopy;  Laterality: N/A;  10:45   ESOPHAGOGASTRODUODENOSCOPY  09/21/2008   RMR: Distal esophageal erosions consistent with erosive reflux esophagitis.Two benign-appearing prepyloric ulcers, otherwise unremarkable/small HH  ESOPHAGOGASTRODUODENOSCOPY (EGD) WITH PROPOFOL N/A 09/09/2020   Procedure: ESOPHAGOGASTRODUODENOSCOPY (EGD) WITH PROPOFOL;  Surgeon: [name], MD;  [facility];  Service: Endoscopy;  Laterality: N/A;   NO PAST SURGERIES     POLYPECTOMY  09/09/2020   Procedure: POLYPECTOMY;  Surgeon: [name], MD;  [facility]: [facility];  Service: Endoscopy;;  ascending x2;descending      Current Outpatient Medications:    furosemide (LASIX) 40 MG tablet, Take 1 tablet (40 mg total) by mouth daily. (Patient taking differently: Take 40 mg by mouth in the morning.), Disp: 30 tablet, Rfl: 1   metoprolol succinate (TOPROL-XL) 25 MG 24 hr tablet, Take 25 mg by mouth 2 (two) times daily., Disp: ,  Rfl:    potassium chloride SA (KLOR-CON) 20 MEQ tablet, Take 1 tablet (20 mEq total) by mouth daily., Disp: 30 tablet, Rfl: 11   polyethylene glycol-electrolytes (NULYTELY) 420 g solution, As directed (Patient not taking: Reported on 12/13/2020), Disp: 4000 mL, Rfl: 0    Physical Exam: Blood pressure 128/62, pulse 74, height 5\' 8"  (1.727 m), weight 103 kg, SpO2 96 %.   Affect appropriate Chronically ill male  HEENT: normal Neck supple with no adenopathy JVP normal no bruits no thyromegaly Lungs clear with no wheezing and good diaphragmatic motion Heart:  S1/S2 no murmur, no rub, gallop or click PMI normal Abdomen: benighn, BS positve, no tenderness, no AAA no bruit.  No HSM or HJR Distal pulses intact with no bruits No edema Neuro non-focal Skin warm and dry No muscular weakness   Labs:   Lab Results  Component Value Date   WBC 5.8 09/06/2020   HGB 15.5 09/06/2020   HCT 45.7 09/06/2020   MCV 106.8 (H) 09/06/2020   PLT 87 (L) 09/06/2020   No results for input(s): NA, K, CL, CO2, BUN, CREATININE, CALCIUM, PROT, BILITOT, ALKPHOS, ALT, AST, GLUCOSE in the last 168 hours.  Invalid input(s): LABALBU Lab Results  Component Value Date   IAXKPVV 748 (H) 06/21/2012   CKMB 1.4 09/21/2008   TROPONINI <0.30 06/18/2012     Radiology: No results found.  EKG: SR LAE low voltage 05/27/20    ASSESSMENT AND PLAN:   CHF:  not clear of diagnosis TTE 05/27/20 showed normal systolic and diastolic function He does have severe LVH and BNP has been mildly elevated in past. Biggest issue will be abstaining from ETOH will order Lexiscan myovue to r/o CAD Continue beta blocker and lasix  Cirrhosis: continues to have excess ETOH causing varices and thrombocytopenia f/u GI continue diuretic and beta blocker Smoking:  no motivation to quit CXR 06/01/20 suggested ILD lung cancer screening CT ordered  HLD:  labs with primary refuses statin    Lung Cancer CT Lexiscan myovue  F/U in a year if  low risk   Signed: Jenkins Rouge 12/13/2020, 10:55 AM

## 2020-12-11 DIAGNOSIS — R609 Edema, unspecified: Secondary | ICD-10-CM | POA: Diagnosis not present

## 2020-12-11 DIAGNOSIS — I5033 Acute on chronic diastolic (congestive) heart failure: Secondary | ICD-10-CM | POA: Diagnosis not present

## 2020-12-11 DIAGNOSIS — J9601 Acute respiratory failure with hypoxia: Secondary | ICD-10-CM | POA: Diagnosis not present

## 2020-12-11 DIAGNOSIS — I509 Heart failure, unspecified: Secondary | ICD-10-CM | POA: Diagnosis not present

## 2020-12-11 DIAGNOSIS — K639 Disease of intestine, unspecified: Secondary | ICD-10-CM | POA: Diagnosis not present

## 2020-12-13 ENCOUNTER — Encounter: Payer: Self-pay | Admitting: *Deleted

## 2020-12-13 ENCOUNTER — Encounter: Payer: Self-pay | Admitting: Cardiovascular Disease

## 2020-12-13 ENCOUNTER — Other Ambulatory Visit: Payer: Self-pay

## 2020-12-13 ENCOUNTER — Telehealth: Payer: Self-pay | Admitting: Cardiovascular Disease

## 2020-12-13 ENCOUNTER — Ambulatory Visit (INDEPENDENT_AMBULATORY_CARE_PROVIDER_SITE_OTHER): Payer: Medicare HMO | Admitting: Cardiovascular Disease

## 2020-12-13 VITALS — BP 128/62 | HR 74 | Ht 68.0 in | Wt 227.0 lb

## 2020-12-13 DIAGNOSIS — E1165 Type 2 diabetes mellitus with hyperglycemia: Secondary | ICD-10-CM | POA: Diagnosis not present

## 2020-12-13 DIAGNOSIS — F17200 Nicotine dependence, unspecified, uncomplicated: Secondary | ICD-10-CM

## 2020-12-13 DIAGNOSIS — K7030 Alcoholic cirrhosis of liver without ascites: Secondary | ICD-10-CM | POA: Diagnosis not present

## 2020-12-13 DIAGNOSIS — I2510 Atherosclerotic heart disease of native coronary artery without angina pectoris: Secondary | ICD-10-CM

## 2020-12-13 DIAGNOSIS — I5030 Unspecified diastolic (congestive) heart failure: Secondary | ICD-10-CM

## 2020-12-13 DIAGNOSIS — I10 Essential (primary) hypertension: Secondary | ICD-10-CM | POA: Diagnosis not present

## 2020-12-13 DIAGNOSIS — E782 Mixed hyperlipidemia: Secondary | ICD-10-CM

## 2020-12-13 NOTE — Telephone Encounter (Signed)
Checking percert on the following patient for testing scheduled at [facility].     LEXISCAN MYOVIEW 12/31/2020  CT CHEST  01/08/2021

## 2020-12-13 NOTE — Patient Instructions (Signed)
Medication Instructions:  Your physician recommends that you continue on your current medications as directed. Please refer to the Current Medication list given to you today.  *If you need a refill on your cardiac medications before your next appointment, please call your pharmacy*   Lab Work: NONE   If you have labs (blood work) drawn today and your tests are completely normal, you will receive your results only by: [place] (if you have MyChart) OR A paper copy in the mail If you have any lab test that is abnormal or we need to change your treatment, we will call you to review the results.   Testing/Procedures: Your physician has requested that you have a lexiscan myoview. For further information please visit [email or web address]. Please follow instruction sheet, as given.  Lung cancer screening CT    Follow-Up: At [facility], you and your health needs are our priority.  As part of our continuing mission to provide you with exceptional heart care, we have created designated Provider Care Teams.  These Care Teams include your primary Cardiologist (physician) and Advanced Practice Providers (APPs -  Physician Assistants and Nurse Practitioners) who all work together to provide you with the care you need, when you need it.  We recommend signing up for the patient portal called "MyChart".  Sign up information is provided on this After Visit Summary.  MyChart is used to connect with patients for Virtual Visits (Telemedicine).  Patients are able to view lab/test results, encounter notes, upcoming appointments, etc.  Non-urgent messages can be sent to your provider as well.   To learn more about what you can do with MyChart, go to [email or web address].    Your next appointment:   1 year(s)  The format for your next appointment:   In Person  Provider:   Jenkins Rouge, MD   Other Instructions Thank you for choosing [facility]!

## 2020-12-30 ENCOUNTER — Ambulatory Visit: Payer: Medicare HMO | Admitting: Gastroenterology

## 2020-12-31 ENCOUNTER — Encounter (HOSPITAL_COMMUNITY): Payer: Medicare HMO

## 2021-01-06 DIAGNOSIS — I5033 Acute on chronic diastolic (congestive) heart failure: Secondary | ICD-10-CM | POA: Diagnosis not present

## 2021-01-06 DIAGNOSIS — K639 Disease of intestine, unspecified: Secondary | ICD-10-CM | POA: Diagnosis not present

## 2021-01-06 DIAGNOSIS — J9601 Acute respiratory failure with hypoxia: Secondary | ICD-10-CM | POA: Diagnosis not present

## 2021-01-06 DIAGNOSIS — I509 Heart failure, unspecified: Secondary | ICD-10-CM | POA: Diagnosis not present

## 2021-01-06 DIAGNOSIS — R609 Edema, unspecified: Secondary | ICD-10-CM | POA: Diagnosis not present

## 2021-01-08 ENCOUNTER — Ambulatory Visit: Payer: Medicare HMO | Admitting: Gastroenterology

## 2021-01-08 ENCOUNTER — Ambulatory Visit (HOSPITAL_COMMUNITY): Admission: RE | Admit: 2021-01-08 | Payer: Medicare HMO | Source: Ambulatory Visit

## 2021-01-10 DIAGNOSIS — R609 Edema, unspecified: Secondary | ICD-10-CM | POA: Diagnosis not present

## 2021-01-10 DIAGNOSIS — I5033 Acute on chronic diastolic (congestive) heart failure: Secondary | ICD-10-CM | POA: Diagnosis not present

## 2021-01-10 DIAGNOSIS — I509 Heart failure, unspecified: Secondary | ICD-10-CM | POA: Diagnosis not present

## 2021-01-10 DIAGNOSIS — K639 Disease of intestine, unspecified: Secondary | ICD-10-CM | POA: Diagnosis not present

## 2021-01-10 DIAGNOSIS — J9601 Acute respiratory failure with hypoxia: Secondary | ICD-10-CM | POA: Diagnosis not present

## 2021-01-23 DIAGNOSIS — I10 Essential (primary) hypertension: Secondary | ICD-10-CM | POA: Diagnosis not present

## 2021-01-24 ENCOUNTER — Encounter: Payer: Self-pay | Admitting: Gastroenterology

## 2021-01-24 ENCOUNTER — Ambulatory Visit: Payer: Medicare HMO | Admitting: Gastroenterology

## 2021-01-24 ENCOUNTER — Other Ambulatory Visit: Payer: Self-pay

## 2021-01-24 VITALS — BP 139/77 | HR 84 | Temp 97.0°F | Ht 69.0 in | Wt 227.0 lb

## 2021-01-24 DIAGNOSIS — K7030 Alcoholic cirrhosis of liver without ascites: Secondary | ICD-10-CM | POA: Diagnosis not present

## 2021-01-24 NOTE — Progress Notes (Signed)
Primary Care Physician: [name], MD  Primary Gastroenterologist:  Garfield Cornea, MD   Chief Complaint  Patient presents with   Cirrhosis    HPI: William Barrett is a 60 y.o. male here for follow-up.  Last seen in April 2022.  History of cirrhosis, suspected alcohol induced.  Since his last visit he completed EGD and colonoscopy as outlined below.  Patient feels well.  No alcohol since hospitalized in March 2022.  Has put on some weight, 13 pounds in the past 6 months.  Girlfriend states he is eating too much.  Denies any abdominal pain, mental status changes, heartburn, constipation, diarrhea, melena, rectal bleeding.  Denies any swelling in his legs.  Notes his abdomen is bigger than before.  EGD June 2022: -Grade 1 esophageal varices.  -Mild portal gastropathy.  -Prepyloric mucosal edema and erosion. Status post biopsy.  Superficial gastric mucosa with mild hyperplastic changes.  No intestinal metaplasia.  H. pylori stains pending.  Colonoscopy June 2022: -Three 5 to 7 mm polyps in the descending colon and in the ascending colon, removed with a cold snare. Resected and retrieved.  Tubular adenomas. -Cecal AVM. -The examination was otherwise normal on direct and retroflexion views.     Current Outpatient Medications  Medication Sig Dispense Refill   furosemide (LASIX) 40 MG tablet Take 1 tablet (40 mg total) by mouth daily. (Patient taking differently: Take 40 mg by mouth in the morning.) 30 tablet 1   metoprolol succinate (TOPROL-XL) 25 MG 24 hr tablet Take 25 mg by mouth 2 (two) times daily.     potassium chloride SA (KLOR-CON) 20 MEQ tablet Take 1 tablet (20 mEq total) by mouth daily. 30 tablet 11   No current facility-administered medications for this visit.    Allergies as of 01/24/2021   (No Known Allergies)    ROS:  General: Negative for anorexia, weight loss, fever, chills, fatigue, weakness. ENT: Negative for hoarseness, difficulty swallowing ,  nasal congestion. CV: Negative for chest pain, angina, palpitations, dyspnea on exertion, peripheral edema.  Respiratory: Negative for dyspnea at rest, dyspnea on exertion, cough, sputum, wheezing.  GI: See history of present illness. GU:  Negative for dysuria, hematuria, urinary incontinence, urinary frequency, nocturnal urination.  Endo: Negative for unusual weight change.    Physical Examination:   BP 139/77   Pulse 84   Temp (!) 97 F (36.1 C)   Ht 5\' 9"  (1.753 m)   Wt 227 lb (103 kg)   BMI 33.52 kg/m   General: Well-nourished, well-developed in no acute distress.  Eyes: No icterus. Mouth: masked Lungs: Clear to auscultation bilaterally.  Heart: Regular rate and rhythm, no murmurs rubs or gallops.  Abdomen: Bowel sounds are normal, nontender,   no hepatosplenomegaly or masses, no abdominal bruits or hernia , no rebound or guarding.  Distended but no fluid wave. Extremities: No lower extremity edema. No clubbing or deformities. Neuro: Alert and oriented x 4   Skin: Warm and dry, no jaundice.   Psych: Alert and cooperative, normal mood and affect.  Labs:  Lab Results  Component Value Date   CREATININE 0.79 09/06/2020   BUN 11 09/06/2020   NA 138 09/06/2020   K 3.4 (L) 09/06/2020   CL 100 09/06/2020   CO2 29 09/06/2020   Lab Results  Component Value Date   ALT 34 09/06/2020   AST 34 09/06/2020   ALKPHOS 92 09/06/2020   BILITOT 0.9 09/06/2020   Lab Results  Component  Value Date   WBC 5.8 09/06/2020   HGB 15.5 09/06/2020   HCT 45.7 09/06/2020   MCV 106.8 (H) 09/06/2020   PLT 87 (L) 09/06/2020   Lab Results  Component Value Date   INR 1.1 06/06/2020   INR 1.1 05/26/2020   INR 1.04 03/05/2014      Imaging Studies: No results found.   Assessment:  Cirrhosis: Suspected alcohol related.  Fortunately stopped drinking alcohol March 2022.  Appears to have well compensated disease at this time.  He is due for hepatoma screening and updated labs.  He had grade  1 esophageal varices on recent EGD.  Discussion today regarding nonselective beta-blocker use for primary prophylaxis against variceal bleeding.  Currently on a beta-blocker tachycardia issues.  We will update labs to determine child class.  If he remains Child A, we can continue to monitor given small size of varices. He will need another EGD in 2 years.    Plan: Update labs and abdominal u/s. Return ov in six months. Continue etoh cessation. Eat healthy, lean meat, avoid fatty/fried foods, limit sweets.

## 2021-01-24 NOTE — Patient Instructions (Addendum)
Please have your labs and ultrasound done. We will be in touch with results as available. I will need to discuss any medication changes with your heart doctor as far as starting the new medication (propranolol) that we discussed today. Since you are on metoprolol I would not advise both medications. We will be in touch with further recommendations.  Return office visit in six months.

## 2021-01-27 ENCOUNTER — Encounter: Payer: Self-pay | Admitting: Gastroenterology

## 2021-01-27 ENCOUNTER — Telehealth: Payer: Self-pay | Admitting: Gastroenterology

## 2021-01-27 ENCOUNTER — Telehealth: Payer: Self-pay | Admitting: *Deleted

## 2021-01-27 DIAGNOSIS — D7589 Other specified diseases of blood and blood-forming organs: Secondary | ICD-10-CM | POA: Diagnosis not present

## 2021-01-27 DIAGNOSIS — I5042 Chronic combined systolic (congestive) and diastolic (congestive) heart failure: Secondary | ICD-10-CM | POA: Diagnosis not present

## 2021-01-27 DIAGNOSIS — R011 Cardiac murmur, unspecified: Secondary | ICD-10-CM | POA: Diagnosis not present

## 2021-01-27 DIAGNOSIS — E870 Hyperosmolality and hypernatremia: Secondary | ICD-10-CM | POA: Diagnosis not present

## 2021-01-27 DIAGNOSIS — R7303 Prediabetes: Secondary | ICD-10-CM | POA: Diagnosis not present

## 2021-01-27 DIAGNOSIS — E782 Mixed hyperlipidemia: Secondary | ICD-10-CM | POA: Diagnosis not present

## 2021-01-27 DIAGNOSIS — D6949 Other primary thrombocytopenia: Secondary | ICD-10-CM | POA: Diagnosis not present

## 2021-01-27 DIAGNOSIS — I10 Essential (primary) hypertension: Secondary | ICD-10-CM | POA: Diagnosis not present

## 2021-01-27 DIAGNOSIS — K7460 Unspecified cirrhosis of liver: Secondary | ICD-10-CM | POA: Diagnosis not present

## 2021-01-27 LAB — SURGICAL PATHOLOGY

## 2021-01-27 NOTE — Telephone Encounter (Signed)
Called pt and he is aware of [place] appt details. He voiced understanding and had no questions

## 2021-01-27 NOTE — Telephone Encounter (Signed)
Called over to [facility] and they referred me to [facility]. Spoke with Varney Biles at [facility] and she stated that she would have to contact someone over at [facility] and have them pull the case and get the H Pylori slide to the Dr to have read and will call/fax once report is ready.

## 2021-01-27 NOTE — Telephone Encounter (Signed)
Please call [facility] and ask for the final result for the H.pylori stains from 09/09/20 path. The pathology report said they would send addendum but I don't see one in chart. See below.  SURGICAL PATHOLOGY SURGICAL PATHOLOGY  CASE: [id number]  PATIENT: William Barrett  Surgical Pathology Report      Clinical History: cirrhosis, colon wall thickening on CT    FINAL MICROSCOPIC DIAGNOSIS:   A. STOMACH, BIOPSY:  -  Superficial gastric mucosa with mild hyperplastic changes  -  No intestinal metaplasia identified  -  See comment   B. COLON, ASCENDING, POLYPECTOMY:  -  Tubular adenoma (2 of 2 fragments)  -  No high-grade dysplasia or malignancy identified   C. COLON, DESCENDING, POLYPECTOMY:  -  Tubular adenoma (1 of 1 fragments)  -  No high-grade dysplasia or malignancy identified   COMMENT:   A.  H. pylori immunohistochemistry is pending and will be reported in an  addendum.

## 2021-01-31 ENCOUNTER — Encounter (HOSPITAL_COMMUNITY): Payer: Self-pay

## 2021-01-31 ENCOUNTER — Other Ambulatory Visit: Payer: Self-pay

## 2021-01-31 ENCOUNTER — Ambulatory Visit (HOSPITAL_COMMUNITY): Admission: RE | Admit: 2021-01-31 | Payer: Medicare HMO | Source: Ambulatory Visit

## 2021-02-12 NOTE — Telephone Encounter (Signed)
Addendum completed. H.pylori negative.

## 2021-02-24 ENCOUNTER — Ambulatory Visit (HOSPITAL_COMMUNITY)
Admission: RE | Admit: 2021-02-24 | Discharge: 2021-02-24 | Disposition: A | Discharge status: Home / Self Care | Payer: Medicare HMO | Source: Ambulatory Visit | Attending: Gastroenterology | Admitting: Gastroenterology

## 2021-02-24 ENCOUNTER — Other Ambulatory Visit: Payer: Self-pay

## 2021-02-24 DIAGNOSIS — R161 Splenomegaly, not elsewhere classified: Secondary | ICD-10-CM | POA: Diagnosis not present

## 2021-02-24 DIAGNOSIS — K8020 Calculus of gallbladder without cholecystitis without obstruction: Secondary | ICD-10-CM | POA: Diagnosis not present

## 2021-02-24 DIAGNOSIS — K7030 Alcoholic cirrhosis of liver without ascites: Secondary | ICD-10-CM | POA: Diagnosis not present

## 2021-03-13 DIAGNOSIS — K7030 Alcoholic cirrhosis of liver without ascites: Secondary | ICD-10-CM | POA: Diagnosis not present

## 2021-03-14 LAB — COMPREHENSIVE METABOLIC PANEL
ALT: 27 IU/L (ref 0–44)
AST: 24 IU/L (ref 0–40)
Albumin/Globulin Ratio: 1.3 (ref 1.2–2.2)
Albumin: 3.9 g/dL (ref 3.8–4.9)
Alkaline Phosphatase: 103 IU/L (ref 44–121)
BUN/Creatinine Ratio: 12 (ref 10–24)
BUN: 11 mg/dL (ref 8–27)
Bilirubin Total: 0.6 mg/dL (ref 0.0–1.2)
CO2: 29 mmol/L (ref 20–29)
Calcium: 8.9 mg/dL (ref 8.6–10.2)
Chloride: 102 mmol/L (ref 96–106)
Creatinine, Ser: 0.89 mg/dL (ref 0.76–1.27)
Globulin, Total: 3.1 g/dL (ref 1.5–4.5)
Glucose: 108 mg/dL — ABNORMAL HIGH (ref 70–99)
Potassium: 4.1 mmol/L (ref 3.5–5.2)
Sodium: 141 mmol/L (ref 134–144)
Total Protein: 7 g/dL (ref 6.0–8.5)
eGFR: 98 mL/min/{1.73_m2} (ref >59)

## 2021-03-14 LAB — PROTIME-INR
INR: 1 (ref 0.9–1.2)
Prothrombin Time: 10.9 s (ref 9.1–12.0)

## 2021-03-14 LAB — CBC WITH DIFFERENTIAL/PLATELET
Basophils Absolute: 0.1 10*3/uL (ref 0.0–0.2)
Basos: 1 %
EOS (ABSOLUTE): 0.6 10*3/uL — ABNORMAL HIGH (ref 0.0–0.4)
Eos: 9 %
Hematocrit: 45 % (ref 37.5–51.0)
Hemoglobin: 16 g/dL (ref 13.0–17.7)
Immature Grans (Abs): 0 10*3/uL (ref 0.0–0.1)
Immature Granulocytes: 0 %
Lymphocytes Absolute: 2.3 10*3/uL (ref 0.7–3.1)
Lymphs: 36 %
MCH: 35.5 pg — ABNORMAL HIGH (ref 26.6–33.0)
MCHC: 35.6 g/dL (ref 31.5–35.7)
MCV: 100 fL — ABNORMAL HIGH (ref 79–97)
Monocytes Absolute: 0.8 10*3/uL (ref 0.1–0.9)
Monocytes: 12 %
Neutrophils Absolute: 2.7 10*3/uL (ref 1.4–7.0)
Neutrophils: 42 %
Platelets: 89 10*3/uL — CL (ref 150–450)
RBC: 4.51 x10E6/uL (ref 4.14–5.80)
RDW: 12.5 % (ref 11.6–15.4)
WBC: 6.4 10*3/uL (ref 3.4–10.8)

## 2021-03-14 LAB — AFP TUMOR MARKER: AFP, Serum, Tumor Marker: 5.4 ng/mL (ref 0.0–8.4)

## 2021-03-20 ENCOUNTER — Telehealth: Payer: Self-pay | Admitting: *Deleted

## 2021-03-20 ENCOUNTER — Telehealth: Payer: Self-pay

## 2021-03-20 NOTE — Telephone Encounter (Signed)
Neil Crouch would like to find out if switching the patient's beta blocker, metoprolol, to carvedilol 3.125 mg BID (due to his varices) would be possible? Would initially start out on 3.125 mg  BID and if tolerated would work towards 6.25 mg BID.

## 2021-03-20 NOTE — Telephone Encounter (Signed)
Spoke to pt's significant other Tammy (DPR) explained to her that the provider was changing is medications. She voiced understanding

## 2021-03-21 MED ORDER — CARVEDILOL 3.125 MG PO TABS: 3.1250 mg | ORAL_TABLET | Freq: Two times a day (BID) | ORAL | 0 refills | Status: DC

## 2021-03-21 NOTE — Telephone Encounter (Signed)
FYI: William Barrett  °

## 2021-03-21 NOTE — Telephone Encounter (Signed)
No ans, vm full. See result note as well.

## 2021-03-21 NOTE — Addendum Note (Signed)
Addended by: Mahala Menghini on: 03/21/2021 09:36 AM   Modules accepted: Orders

## 2021-03-21 NOTE — Telephone Encounter (Signed)
Please let patient know that we would advise him to be on coreg 3.125 mg BID to help prevent esophageal variceal bleeding. We discussed this possibility at his last OV but did not make the change until we had updated labs and got it approved by his cardiologist.   I will send in coreg to his pharmacy. He will need to come in for a nurse check in 2-3 weeks after starting to see what his BP and pulse are and make sure he is not having any fatigue, swelling, shortness of breath or dizziness.   We will only send in 30 day supply at first as eventually we may need to increase the dose.   HE NEEDS TO STOP METOPROLOL WHEN HE STARTS THE COREG (CARVEDILOL). His cardiologist is aware and has approved the change.   Please make patient aware.

## 2021-03-24 NOTE — Telephone Encounter (Signed)
Spoke with gf Nadea Kirkland and advised her of change and instructions. Gf verbalized understanding and will let pt know to follow up in 2 to 3 weeks as a nurse check.

## 2021-03-24 NOTE — Telephone Encounter (Signed)
Lmom for pt to return my call.  

## 2021-04-15 DIAGNOSIS — I10 Essential (primary) hypertension: Secondary | ICD-10-CM | POA: Diagnosis not present

## 2021-04-15 DIAGNOSIS — E782 Mixed hyperlipidemia: Secondary | ICD-10-CM | POA: Diagnosis not present

## 2021-04-22 ENCOUNTER — Telehealth: Payer: Self-pay | Admitting: *Deleted

## 2021-04-22 ENCOUNTER — Telehealth: Payer: Self-pay | Admitting: Internal Medicine

## 2021-04-22 ENCOUNTER — Other Ambulatory Visit: Payer: Self-pay | Admitting: Gastroenterology

## 2021-04-22 NOTE — Telephone Encounter (Signed)
Patient girlfriend called again wanting to know about the blood pressure pills. I told her patient needed to call the number on the medication bottle and his pcp

## 2021-04-22 NOTE — Telephone Encounter (Signed)
Noted. I sent a note to provider. Pt was supposed to have nurse visit 2 weeks ago.

## 2021-04-22 NOTE — Telephone Encounter (Signed)
Spoke to pt's significant other Tammy (DPR), she stated that the pt only has 3 more Coreg 3.14mg . He has an appointment with PCP on 05/12/21. Wants to know if she can get enough Coreg to last till his appointment.

## 2021-04-23 ENCOUNTER — Telehealth: Payer: Self-pay

## 2021-04-23 ENCOUNTER — Telehealth: Payer: Self-pay | Admitting: Student

## 2021-04-23 MED ORDER — CARVEDILOL 6.25 MG PO TABS: 6.2500 mg | ORAL_TABLET | Freq: Two times a day (BID) | ORAL | 3 refills | Status: DC

## 2021-04-23 NOTE — Telephone Encounter (Signed)
Pt's girlfriend Khalifa Knecht states that the pt's sister will be bringing the pt to have his bp and pulse checked sometime this morning. The pt has three pills left.

## 2021-04-23 NOTE — Telephone Encounter (Signed)
See telephone note from 03/20/21, plan was clearly outlined there. We are not treating BP. He will need to come back and get future refill from [place] not his PCP.  Just FYI.

## 2021-04-23 NOTE — Telephone Encounter (Signed)
Noted  

## 2021-04-23 NOTE — Telephone Encounter (Signed)
Pt came in to the office for a bp check and to have his pulse checked per Neil Crouch. Pt's bp and pulse was checked using a DINAMAP. Pt's bp reading was 189/93 with pulse being 78. Pt was then instructed to sit in the waiting room for the next ten mins to see if his bp came down. Pt was then taken to a room and a manual bp was taken along with his pulse being checked by a pulse ox. Pt's bp was 170/80 and pulse was 77. Pt stated that he had not taken any of his medications this morning. Pt was not having a headache or dizziness. Pt states that he is only taking potassium, lasix, and coreg.

## 2021-04-23 NOTE — Telephone Encounter (Signed)
error 

## 2021-04-23 NOTE — Telephone Encounter (Signed)
See other note

## 2021-04-23 NOTE — Telephone Encounter (Signed)
Pt was made aware and verbalized understanding. Pt will return on Wed 04/30/21 to have his bp and pulse checked as requested.

## 2021-04-23 NOTE — Telephone Encounter (Signed)
Per William Barrett pt must come in to have his bp and pulse checked before we can send in a refill. If the pt has ran out of medication we will send in a refill and he will need to come in to have his bp and pulse checked 2 to 3 weeks later.   Lmom for pt's girlfriend [name] to return my call.

## 2021-04-23 NOTE — Addendum Note (Signed)
Addended by: Mahala Menghini on: 04/23/2021 11:37 AM   Modules accepted: Orders

## 2021-04-23 NOTE — Telephone Encounter (Signed)
Pt's girlfriend, [name], was calling to speak with nurse. Please call [phone]

## 2021-04-23 NOTE — Telephone Encounter (Signed)
Will increase coreg to 6.25mg  BID. RX sent.   Please have patient return in one week for BP, pulse check with nurse.

## 2021-05-05 DIAGNOSIS — I10 Essential (primary) hypertension: Secondary | ICD-10-CM | POA: Diagnosis not present

## 2021-05-05 DIAGNOSIS — R7303 Prediabetes: Secondary | ICD-10-CM | POA: Diagnosis not present

## 2021-05-06 ENCOUNTER — Telehealth: Payer: Self-pay

## 2021-05-06 DIAGNOSIS — H5213 Myopia, bilateral: Secondary | ICD-10-CM | POA: Diagnosis not present

## 2021-05-06 NOTE — Telephone Encounter (Signed)
Agree 

## 2021-05-06 NOTE — Telephone Encounter (Signed)
Pt came in to the office today for a bp and pulse check. Pt's bp was 160/90 and pulse was 73. Pt stated that he had not taken any of his meds today. Pt was instructed to come back another day several hours after taking his morning meds to have his bp and pulse rechecked. Pt verbalized understanding.

## 2021-05-12 ENCOUNTER — Telehealth: Payer: Self-pay

## 2021-05-12 ENCOUNTER — Telehealth: Payer: Self-pay | Admitting: Gastroenterology

## 2021-05-12 DIAGNOSIS — R011 Cardiac murmur, unspecified: Secondary | ICD-10-CM | POA: Diagnosis not present

## 2021-05-12 DIAGNOSIS — I10 Essential (primary) hypertension: Secondary | ICD-10-CM | POA: Diagnosis not present

## 2021-05-12 DIAGNOSIS — F17200 Nicotine dependence, unspecified, uncomplicated: Secondary | ICD-10-CM | POA: Diagnosis not present

## 2021-05-12 DIAGNOSIS — D6949 Other primary thrombocytopenia: Secondary | ICD-10-CM | POA: Diagnosis not present

## 2021-05-12 DIAGNOSIS — E782 Mixed hyperlipidemia: Secondary | ICD-10-CM | POA: Diagnosis not present

## 2021-05-12 DIAGNOSIS — I5042 Chronic combined systolic (congestive) and diastolic (congestive) heart failure: Secondary | ICD-10-CM | POA: Diagnosis not present

## 2021-05-12 DIAGNOSIS — K7460 Unspecified cirrhosis of liver: Secondary | ICD-10-CM | POA: Diagnosis not present

## 2021-05-12 DIAGNOSIS — R7303 Prediabetes: Secondary | ICD-10-CM | POA: Diagnosis not present

## 2021-05-12 NOTE — Telephone Encounter (Signed)
[phone] please call patient girlfriend tammy.  She has a question about his medication

## 2021-05-12 NOTE — Telephone Encounter (Signed)
Pt came into the office for a bp check and a pulse check. Pt is currently on Coreg 6.5 mg twice daily. Pt's bp is 164/90 and pulse was 69.

## 2021-05-12 NOTE — Telephone Encounter (Signed)
BP and pulse noted.   For the purposes of what we are treating with the coreg, which is try to prevent bleeding from the esophageal varcies, his pulse is down appropriately and we are at maximum dose of coreg for that.   He will need to discuss any additional medications that could potentially be added to gain blood pressure control with his PCP or cardiologist.

## 2021-05-12 NOTE — Telephone Encounter (Signed)
Lmom for pt's gf William Barrett to return my call.

## 2021-05-12 NOTE — Telephone Encounter (Signed)
Informed pt's gf of what the pt needs to do regarding his bp. Gf verbalized understanding.

## 2021-05-12 NOTE — Telephone Encounter (Signed)
Pt was made aware and verbalized understanding as well as the pt's sister Helene Kelp.

## 2021-05-19 DIAGNOSIS — Z0100 Encounter for examination of eyes and vision without abnormal findings: Secondary | ICD-10-CM | POA: Diagnosis not present

## 2021-05-26 DIAGNOSIS — I10 Essential (primary) hypertension: Secondary | ICD-10-CM | POA: Diagnosis not present

## 2021-07-07 ENCOUNTER — Encounter: Payer: Self-pay | Admitting: Internal Medicine

## 2021-08-12 DIAGNOSIS — I10 Essential (primary) hypertension: Secondary | ICD-10-CM | POA: Diagnosis not present

## 2021-08-12 DIAGNOSIS — R7303 Prediabetes: Secondary | ICD-10-CM | POA: Diagnosis not present

## 2021-08-20 ENCOUNTER — Other Ambulatory Visit: Payer: Self-pay | Admitting: Family Medicine

## 2021-08-20 ENCOUNTER — Other Ambulatory Visit (HOSPITAL_COMMUNITY): Payer: Self-pay | Admitting: Family Medicine

## 2021-08-20 DIAGNOSIS — F17210 Nicotine dependence, cigarettes, uncomplicated: Secondary | ICD-10-CM

## 2021-08-20 DIAGNOSIS — I5042 Chronic combined systolic (congestive) and diastolic (congestive) heart failure: Secondary | ICD-10-CM | POA: Diagnosis not present

## 2021-08-20 DIAGNOSIS — F17200 Nicotine dependence, unspecified, uncomplicated: Secondary | ICD-10-CM | POA: Diagnosis not present

## 2021-08-20 DIAGNOSIS — R011 Cardiac murmur, unspecified: Secondary | ICD-10-CM | POA: Diagnosis not present

## 2021-08-20 DIAGNOSIS — D6949 Other primary thrombocytopenia: Secondary | ICD-10-CM | POA: Diagnosis not present

## 2021-08-20 DIAGNOSIS — I10 Essential (primary) hypertension: Secondary | ICD-10-CM | POA: Diagnosis not present

## 2021-08-20 DIAGNOSIS — K7460 Unspecified cirrhosis of liver: Secondary | ICD-10-CM | POA: Diagnosis not present

## 2021-08-20 DIAGNOSIS — R7303 Prediabetes: Secondary | ICD-10-CM | POA: Diagnosis not present

## 2021-08-20 DIAGNOSIS — E669 Obesity, unspecified: Secondary | ICD-10-CM | POA: Diagnosis not present

## 2021-08-20 DIAGNOSIS — E782 Mixed hyperlipidemia: Secondary | ICD-10-CM | POA: Diagnosis not present

## 2021-10-10 ENCOUNTER — Encounter (HOSPITAL_COMMUNITY): Payer: Self-pay

## 2021-10-10 ENCOUNTER — Ambulatory Visit (HOSPITAL_COMMUNITY): Admission: RE | Admit: 2021-10-10 | Payer: Medicare HMO | Source: Ambulatory Visit

## 2021-10-10 DIAGNOSIS — F29 Unspecified psychosis not due to a substance or known physiological condition: Secondary | ICD-10-CM | POA: Diagnosis not present

## 2021-10-10 DIAGNOSIS — R011 Cardiac murmur, unspecified: Secondary | ICD-10-CM | POA: Diagnosis not present

## 2021-10-10 DIAGNOSIS — K7460 Unspecified cirrhosis of liver: Secondary | ICD-10-CM | POA: Diagnosis not present

## 2021-10-10 DIAGNOSIS — I5042 Chronic combined systolic (congestive) and diastolic (congestive) heart failure: Secondary | ICD-10-CM | POA: Diagnosis not present

## 2021-10-13 DIAGNOSIS — F29 Unspecified psychosis not due to a substance or known physiological condition: Secondary | ICD-10-CM | POA: Diagnosis not present

## 2021-10-29 ENCOUNTER — Emergency Department: Payer: Medicare HMO

## 2021-10-29 ENCOUNTER — Emergency Department
Admission: EM | Admit: 2021-10-29 | Discharge: 2021-10-29 | Disposition: A | Discharge status: Home / Self Care | Payer: Medicare HMO | Attending: Emergency Medicine | Admitting: Emergency Medicine

## 2021-10-29 ENCOUNTER — Other Ambulatory Visit: Payer: Self-pay

## 2021-10-29 DIAGNOSIS — F17290 Nicotine dependence, other tobacco product, uncomplicated: Secondary | ICD-10-CM | POA: Insufficient documentation

## 2021-10-29 DIAGNOSIS — R079 Chest pain, unspecified: Secondary | ICD-10-CM | POA: Diagnosis not present

## 2021-10-29 DIAGNOSIS — R4582 Worries: Secondary | ICD-10-CM | POA: Insufficient documentation

## 2021-10-29 DIAGNOSIS — I10 Essential (primary) hypertension: Secondary | ICD-10-CM | POA: Insufficient documentation

## 2021-10-29 DIAGNOSIS — R0789 Other chest pain: Secondary | ICD-10-CM | POA: Diagnosis not present

## 2021-10-29 DIAGNOSIS — Z79899 Other long term (current) drug therapy: Secondary | ICD-10-CM | POA: Diagnosis not present

## 2021-10-29 DIAGNOSIS — I5033 Acute on chronic diastolic (congestive) heart failure: Secondary | ICD-10-CM | POA: Diagnosis not present

## 2021-10-29 DIAGNOSIS — R441 Visual hallucinations: Secondary | ICD-10-CM | POA: Diagnosis not present

## 2021-10-29 DIAGNOSIS — F419 Anxiety disorder, unspecified: Secondary | ICD-10-CM | POA: Diagnosis not present

## 2021-10-29 DIAGNOSIS — I110 Hypertensive heart disease with heart failure: Secondary | ICD-10-CM | POA: Diagnosis not present

## 2021-10-29 LAB — CBC
HCT: 42.8 % (ref 39.0–52.0)
Hemoglobin: 15 g/dL (ref 13.0–17.0)
MCH: 33.9 pg (ref 26.0–34.0)
MCHC: 35 g/dL (ref 30.0–36.0)
MCV: 96.8 fL (ref 80.0–100.0)
Platelets: 114 10*3/uL — ABNORMAL LOW (ref 150–400)
RBC: 4.42 MIL/uL (ref 4.22–5.81)
RDW: 12.3 % (ref 11.5–15.5)
WBC: 8.6 10*3/uL (ref 4.0–10.5)
nRBC: 0 % (ref 0.0–0.2)

## 2021-10-29 LAB — URINE DRUG SCREEN, QUALITATIVE (ARMC ONLY)
Amphetamines, Ur Screen: NOT DETECTED
Barbiturates, Ur Screen: NOT DETECTED
Benzodiazepine, Ur Scrn: NOT DETECTED
Cannabinoid 50 Ng, Ur ~~LOC~~: NOT DETECTED
Cocaine Metabolite,Ur ~~LOC~~: NOT DETECTED
MDMA (Ecstasy)Ur Screen: NOT DETECTED
Methadone Scn, Ur: NOT DETECTED
Opiate, Ur Screen: NOT DETECTED
Phencyclidine (PCP) Ur S: NOT DETECTED
Tricyclic, Ur Screen: NOT DETECTED

## 2021-10-29 LAB — URINALYSIS, ROUTINE W REFLEX MICROSCOPIC
Bilirubin Urine: NEGATIVE
Glucose, UA: 150 mg/dL — AB
Hgb urine dipstick: NEGATIVE
Ketones, ur: NEGATIVE mg/dL
Leukocytes,Ua: NEGATIVE
Nitrite: NEGATIVE
Protein, ur: NEGATIVE mg/dL
Specific Gravity, Urine: 1.016 (ref 1.005–1.030)
pH: 5 (ref 5.0–8.0)

## 2021-10-29 LAB — TSH: TSH: 1.519 u[IU]/mL (ref 0.350–4.500)

## 2021-10-29 LAB — BASIC METABOLIC PANEL
Anion gap: 7 (ref 5–15)
BUN: 27 mg/dL — ABNORMAL HIGH (ref 8–23)
CO2: 28 mmol/L (ref 22–32)
Calcium: 9.4 mg/dL (ref 8.9–10.3)
Chloride: 99 mmol/L (ref 98–111)
Creatinine, Ser: 1.1 mg/dL (ref 0.61–1.24)
GFR, Estimated: >60 mL/min (ref >60)
Glucose, Bld: 123 mg/dL — ABNORMAL HIGH (ref 70–99)
Potassium: 3.8 mmol/L (ref 3.5–5.1)
Sodium: 134 mmol/L — ABNORMAL LOW (ref 135–145)

## 2021-10-29 LAB — TROPONIN I (HIGH SENSITIVITY)
Troponin I (High Sensitivity): 16 ng/L (ref <18)
Troponin I (High Sensitivity): 16 ng/L (ref <18)

## 2021-10-29 LAB — BRAIN NATRIURETIC PEPTIDE: B Natriuretic Peptide: 76.8 pg/mL (ref 0.0–100.0)

## 2021-10-29 NOTE — Consult Note (Signed)
[facility] Consult   Reason for Consult: "Seeing letters and words come in my mind"  Referring Physician: EDP Patient Identification: William Barrett MRN:  [id number] Principal Diagnosis: Anxiety Diagnosis:  Principal Problem:   Anxiety   Total Time spent with patient: 1 hour  Subjective: "Words run in my mind."  William Barrett is a 61 y.o. male patient admitted with seeing letters and words in his mind.  HPI: Patient presents to the ED, dropped off by his girlfriend because for several months he has been seeing letters and words in his mind and this is worrisome to him.  On evaluation, it is very difficult to get an accurate assessment of exactly what patient is referring to.  After some length of time trying to ascertain just what he means, patient states that he sometimes sees letters or words and he wants to move his head to the left right up and down to see exactly what they are.  He says that he wants to get it right, saying "I think I got to do it the way I got to do it."   Patient is very calm, pleasant, cooperative.  He is alert and oriented and to self, time, place, situation.  He appears to have some cognitive delay at baseline.  He speaks in 1-5 word sentences, but answers questions appropriately seeming to understand what is being asked.  Questions were asked several different ways eliciting same response. Patient denies suicidal ideation, homicidal ideation, paranoia, auditory or visual hallucinations.  He states that it makes his chest hurt if he cannot get it right.  Patient has been medically cleared.  Patient states he did go to his doctor about this and it has been going on for a couple months.  His doctor, Dr. [name] at [facility] gave him sertraline.  Patient's girlfriend brought him to the ED because she thought we could get him something else. Patient lives with his sister, who he states works a lot and he does not like to be alone.  Patient  states that these kinds of things, seeing words and letters, happened more when she is gone.  She is gone for 12 to 14 hours a day.  Patient denies that he is seeing any of these words or letters at this time.  Discussed with patient that this is something he can speak with his outpatient provider about, possibly refer him to psychiatry.  Patient agrees.  Attempts to reach his sister were unsuccessful.  Reached his girlfriend by phone, she agreed to come pick him up.  She does state that he spends too much time alone and she does not really live near him so that she can visit very much.  She has no concerns about his safety.  Past Psychiatric History: Denies prior  Risk to Self:   Risk to Others:   Prior Inpatient Therapy:   Prior Outpatient Therapy:    Past Medical History:  Past Medical History:  Diagnosis Date   Alcoholism ([place])    Anemia    Cardiac murmur    Has been evaluated by cardiology with echocardiogram in 2014 without significant findings   Hypertension    Tachycardia, unspecified    Thrombocytopenia ([place])    Undiagnosed cardiac murmurs     Past Surgical History:  Procedure Laterality Date   BIOPSY  09/09/2020   Procedure: BIOPSY;  Surgeon: [name], MD;  [facility];  Service: Endoscopy;;  gastric   COLONOSCOPY  WITH PROPOFOL N/A 09/09/2020   Procedure: COLONOSCOPY WITH PROPOFOL;  Surgeon: [name], MD;  [facility];  Service: Endoscopy;  Laterality: N/A;  10:45   ESOPHAGOGASTRODUODENOSCOPY  09/21/2008   RMR: Distal esophageal erosions consistent with erosive reflux esophagitis.Two benign-appearing prepyloric ulcers, otherwise unremarkable/small HH   ESOPHAGOGASTRODUODENOSCOPY (EGD) WITH PROPOFOL N/A 09/09/2020   Procedure: ESOPHAGOGASTRODUODENOSCOPY (EGD) WITH PROPOFOL;  Surgeon: [name], MD;  [facility];  Service: Endoscopy;  Laterality: N/A;   NO PAST SURGERIES     POLYPECTOMY  09/09/2020   Procedure:  POLYPECTOMY;  Surgeon: [name], MD;  [facility];  Service: Endoscopy;;  ascending x2;descending   Family History:  Family History  Problem Relation Age of Onset   Hypertension Mother    Hypertension Father    Liver disease Neg Hx    Colon cancer Neg Hx    Family Psychiatric  History: Unknown Social History:  Social History   Substance and Sexual Activity  Alcohol Use Not Currently   Comment: 2 40oz beers daily, patient quit drinking March 2022      Social History   Substance and Sexual Activity  Drug Use No    Social History   Socioeconomic History   Marital status: Single    Spouse name: Not on file   Number of children: 0   Years of education: Not on file   Highest education level: Not on file  Occupational History   Occupation: disability  Tobacco Use   Smoking status: Every Day    Packs/day: 3.00    Years: 25.00    Total pack years: 75.00    Types: Cigarettes, Cigars   Smokeless tobacco: Never  Vaping Use   Vaping Use: Never used  Substance and Sexual Activity   Alcohol use: Not Currently    Comment: 2 40oz beers daily, patient quit drinking March 2022    Drug use: No   Sexual activity: Not Currently  Other Topics Concern   Not on file  Social History Narrative   Not on file   Social Determinants of Health   Financial Resource Strain: Not on file  Food Insecurity: Not on file  Transportation Needs: Not on file  Physical Activity: Not on file  Stress: Not on file  Social Connections: Not on file   Additional Social History:    Allergies:  No Known Allergies  Labs:  Results for orders placed or performed during the hospital encounter of 10/29/21 (from the past 48 hour(s))  Basic metabolic panel     Status: Abnormal   Collection Time: 10/29/21  1:26 PM  Result Value Ref Range   Sodium 134 (L) 135 - 145 mmol/L   Potassium 3.8 3.5 - 5.1 mmol/L   Chloride 99 98 - 111 mmol/L   CO2 28 22 - 32 mmol/L   Glucose, Bld 123 (H) 70  - 99 mg/dL    Comment: Glucose reference range applies only to samples taken after fasting for at least 8 hours.   BUN 27 (H) 8 - 23 mg/dL   Creatinine, Ser 1.10 0.61 - 1.24 mg/dL   Calcium 9.4 8.9 - 10.3 mg/dL   GFR, Estimated >60 >60 mL/min    Comment: (NOTE) Calculated using the CKD-EPI Creatinine Equation (2021)    Anion gap 7 5 - 15    Comment: Performed at [facility], [address] [place], [place] [address]  CBC     Status: Abnormal   Collection  Time: 10/29/21  1:26 PM  Result Value Ref Range   WBC 8.6 4.0 - 10.5 K/uL   RBC 4.42 4.22 - 5.81 MIL/uL   Hemoglobin 15.0 13.0 - 17.0 g/dL   HCT 42.8 39.0 - 52.0 %   MCV 96.8 80.0 - 100.0 fL   MCH 33.9 26.0 - 34.0 pg   MCHC 35.0 30.0 - 36.0 g/dL   RDW 12.3 11.5 - 15.5 %   Platelets 114 (L) 150 - 400 K/uL    Comment: REPEATED TO VERIFY   nRBC 0.0 0.0 - 0.2 %    Comment: Performed at [facility], [place], [place] [address]  Troponin I (High Sensitivity)     Status: None   Collection Time: 10/29/21  1:26 PM  Result Value Ref Range   Troponin I (High Sensitivity) 16 <18 ng/L    Comment: (NOTE) Elevated high sensitivity troponin I (hsTnI) values and significant  changes across serial measurements may suggest ACS but many other  chronic and acute conditions are known to elevate hsTnI results.  Refer to the "Links" section for chest pain algorithms and additional  guidance. Performed at [facility], [address] [place], [place] [address]   TSH     Status: None   Collection Time: 10/29/21  3:50 PM  Result Value Ref Range   TSH 1.519 0.350 - 4.500 uIU/mL    Comment: Performed by a 3rd Generation assay with a functional sensitivity of <=0.01 uIU/mL. Performed at [facility], [place], [place] [address]   Troponin I (High Sensitivity)     Status: None   Collection Time: 10/29/21  3:50 PM  Result Value Ref Range   Troponin I (High Sensitivity) 16 <18  ng/L    Comment: (NOTE) Elevated high sensitivity troponin I (hsTnI) values and significant  changes across serial measurements may suggest ACS but many other  chronic and acute conditions are known to elevate hsTnI results.  Refer to the "Links" section for chest pain algorithms and additional  guidance. Performed at [facility], [address] [place], [place] [address]   Brain natriuretic peptide     Status: None   Collection Time: 10/29/21  3:50 PM  Result Value Ref Range   B Natriuretic Peptide 76.8 0.0 - 100.0 pg/mL    Comment: Performed at [facility], [address] [place], [place] [address]  Urine Drug Screen, Qualitative     Status: None   Collection Time: 10/29/21  4:46 PM  Result Value Ref Range   Tricyclic, Ur Screen NONE DETECTED NONE DETECTED   Amphetamines, Ur Screen NONE DETECTED NONE DETECTED   MDMA (Ecstasy)Ur Screen NONE DETECTED NONE DETECTED   Cocaine Metabolite,Ur [place] NONE DETECTED NONE DETECTED   Opiate, Ur Screen NONE DETECTED NONE DETECTED   Phencyclidine (PCP) Ur S NONE DETECTED NONE DETECTED   Cannabinoid 50 Ng, Ur [place] NONE DETECTED NONE DETECTED   Barbiturates, Ur Screen NONE DETECTED NONE DETECTED   Benzodiazepine, Ur Scrn NONE DETECTED NONE DETECTED   Methadone Scn, Ur NONE DETECTED NONE DETECTED    Comment: (NOTE) Tricyclics + metabolites, urine    Cutoff 1000 ng/mL Amphetamines + metabolites, urine  Cutoff 1000 ng/mL MDMA (Ecstasy), urine              Cutoff 500 ng/mL Cocaine Metabolite, urine          Cutoff 300 ng/mL Opiate + metabolites, urine  Cutoff 300 ng/mL Phencyclidine (PCP), urine         Cutoff 25 ng/mL Cannabinoid, urine                 Cutoff 50 ng/mL Barbiturates + metabolites, urine  Cutoff 200 ng/mL Benzodiazepine, urine              Cutoff 200 ng/mL Methadone, urine                   Cutoff 300 ng/mL  The urine drug screen provides only a preliminary, unconfirmed analytical test result and should  not be used for non-medical purposes. Clinical consideration and professional judgment should be applied to any positive drug screen result due to possible interfering substances. A more specific alternate chemical method must be used in order to obtain a confirmed analytical result. Gas chromatography / mass spectrometry (GC/MS) is the preferred confirm atory method. Performed at [facility], [address] [place], [place] [address]   Urinalysis, Routine w reflex microscopic Urine, Random     Status: Abnormal   Collection Time: 10/29/21  4:46 PM  Result Value Ref Range   Color, Urine YELLOW (A) YELLOW   APPearance CLEAR (A) CLEAR   Specific Gravity, Urine 1.016 1.005 - 1.030   pH 5.0 5.0 - 8.0   Glucose, UA 150 (A) NEGATIVE mg/dL   Hgb urine dipstick NEGATIVE NEGATIVE   Bilirubin Urine NEGATIVE NEGATIVE   Ketones, ur NEGATIVE NEGATIVE mg/dL   Protein, ur NEGATIVE NEGATIVE mg/dL   Nitrite NEGATIVE NEGATIVE   Leukocytes,Ua NEGATIVE NEGATIVE    Comment: Performed at [facility], [address] [place], [place] [address]    No current facility-administered medications for this encounter.   Current Outpatient Medications  Medication Sig Dispense Refill   carvedilol (COREG) 6.25 MG tablet Take 1 tablet (6.25 mg total) by mouth 2 (two) times daily with a meal. Do not take metoprolol. Return in 2 weeks for BP, pulse check. 60 tablet 3   furosemide (LASIX) 40 MG tablet Take 1 tablet (40 mg total) by mouth daily. (Patient taking differently: Take 40 mg by mouth in the morning.) 30 tablet 1   lisinopril (ZESTRIL) 20 MG tablet Take 20 mg by mouth daily.     potassium chloride SA (KLOR-CON) 20 MEQ tablet Take 1 tablet (20 mEq total) by mouth daily. 30 tablet 11   sertraline (ZOLOFT) 50 MG tablet Take 50 mg by mouth daily.      Musculoskeletal: Strength & Muscle Tone: within normal limits Gait & Station: normal Patient leans: N/A   Psychiatric Specialty  Exam:  Presentation  General Appearance: Casual Eye Contact:Good Speech:Clear and Coherent Speech Volume:Normal Handedness:No data recorded  Mood and Affect  Mood:Euthymic Affect:Appropriate  Thought Process  Thought Processes:Coherent Descriptions of Associations:Intact  Orientation:Full (Time, Place and Person)  Thought Content:Other (comment) (appears to have baseline cognitive delay, mild)  History of Schizophrenia/Schizoaffective disorder:No data recorded Duration of Psychotic Symptoms:No data recorded Hallucinations:Hallucinations: None  Ideas of Reference:None  Suicidal Thoughts:Suicidal Thoughts: No  Homicidal Thoughts:Homicidal Thoughts: No   Sensorium  Memory:Immediate Good  Judgment:Good  Insight:Fair   Executive Functions  Concentration:Fair  Attention Span:Fair  [place]   Psychomotor Activity  Psychomotor Activity:Psychomotor Activity: Normal   Assets  Assets:Desire for Improvement   Sleep  Sleep:Sleep: Good   Physical Exam: Physical Exam Vitals and nursing note reviewed.  HENT:     Head: Normocephalic.     Nose:  No congestion or rhinorrhea.  Cardiovascular:     Rate and Rhythm: Normal rate.  Pulmonary:     Effort: Pulmonary effort is normal.  Musculoskeletal:        General: Normal range of motion.     Cervical back: Normal range of motion.  Skin:    General: Skin is dry.  Neurological:     Mental Status: He is alert and oriented to person, place, and time.  Psychiatric:        Attention and Perception: Attention normal.        Mood and Affect: Mood normal.        Speech: Speech normal.        Behavior: Behavior is cooperative.        Thought Content: Thought content is not paranoid or delusional. Thought content does not include homicidal or suicidal ideation.    Review of Systems  Constitutional: Negative.   HENT: Negative.    Eyes: Negative.   Respiratory: Negative.     Cardiovascular: Negative.   Musculoskeletal: Negative.   Skin: Negative.   Psychiatric/Behavioral:  Negative for depression, hallucinations, memory loss, substance abuse and suicidal ideas. The patient is not nervous/anxious and does not have insomnia.    Blood pressure 108/80, pulse 61, temperature 98.3 F (36.8 C), temperature source Oral, resp. rate 19, height '5\' 8"'$  (1.727 m), weight 101.2 kg, SpO2 95 %. Body mass index is 33.91 kg/m.  Treatment Plan Summary: Plan patient to follow-up with his outpatient provider.  Consider outpatient psychiatric referral.  Disposition: No evidence of imminent risk to self or others at present.   Patient does not meet criteria for psychiatric inpatient admission. Supportive therapy provided about ongoing stressors. Discussed crisis plan, support from social network, calling 911, coming to the Emergency Department, and calling Suicide Hotline.  Sherlon Handing, NP 10/29/2021 7:00 PM

## 2021-10-29 NOTE — Discharge Instructions (Signed)
Your EKG and lab tests were all okay today.  Please follow-up with your primary care doctor for continued evaluation of your symptoms.

## 2021-10-29 NOTE — ED Triage Notes (Signed)
Pt states that he worries a lot, states that he will see words pop into his mind and will cause him to worry about that until he gets the worry taken care of and it is causing his chest to hurt worse

## 2021-10-29 NOTE — ED Provider Notes (Signed)
[facility] Provider Note    Event Date/Time   First MD Initiated Contact with Patient 10/29/21 1323     (approximate)   History   excessive worry, Chest Pain, and visual hallucinations   HPI  William Barrett is a 62 y.o. male with a former history of alcohol abuse, as well as anemia, hypertension, and thrombocytopenia who presents with chest pain, anxiety, and possible hallucinations.  The patient states that over the last few months he has had episodes in which he will see or read various letters which sometimes spell words.  He feels that he has to write these down.  Sometimes the letters and words are coming very fast or he cannot write them out and he becomes anxious.  He states that when this happens he starts to have chest pain.  This has worsened over the last few days.  He is not having any chest pain currently.  Denies associated shortness of breath or lightheadedness.  He has no cough or fever.  He denies SI or HI.  He is on sertraline but no other mental health medications.  He denies any recent medication changes.     Physical Exam   Triage Vital Signs: ED Triage Vitals  Enc Vitals Group     BP 10/29/21 1314 (!) 80/48     Pulse Rate 10/29/21 1314 76     Resp 10/29/21 1314 16     Temp 10/29/21 1314 98.4 F (36.9 C)     Temp Source 10/29/21 1314 Oral     SpO2 10/29/21 1314 91 %     Weight 10/29/21 1317 223 lb (101.2 kg)     Height 10/29/21 1317 '5\' 8"'$  (1.727 m)     Head Circumference --      Peak Flow --      Pain Score 10/29/21 1317 0     Pain Loc --      Pain Edu? --      Excl. in [place]? --     Most recent vital signs: Vitals:   10/29/21 1326 10/29/21 1500  BP: 98/65 109/84  Pulse: 73 60  Resp: 18 17  Temp:    SpO2: 96% 93%     General: Alert and oriented, comfortable appearing. CV:  Good peripheral perfusion.  Normal heart sounds. Resp:  Normal effort.  Lungs CTAB. Abd:  No distention.  Other:  No peripheral edema.  Motor  intact in all extremities.   ED Results / Procedures / Treatments   Labs (all labs ordered are listed, but only abnormal results are displayed) Labs Reviewed  BASIC METABOLIC PANEL - Abnormal; Notable for the following components:      Result Value   Sodium 134 (*)    Glucose, Bld 123 (*)    BUN 27 (*)    All other components within normal limits  CBC - Abnormal; Notable for the following components:   Platelets 114 (*)    All other components within normal limits  TSH  URINE DRUG SCREEN, QUALITATIVE ([facility] ONLY)  URINALYSIS, ROUTINE W REFLEX MICROSCOPIC  BRAIN NATRIURETIC PEPTIDE  TROPONIN I (HIGH SENSITIVITY)  TROPONIN I (HIGH SENSITIVITY)     EKG  ED ECG REPORT I, Arta Silence, the attending physician, personally viewed and interpreted this ECG.  Date: 10/29/2021 EKG Time: 1321 Rate: 70 Rhythm: normal sinus rhythm QRS Axis: normal Intervals: LAFB ST/T Wave abnormalities: normal Narrative Interpretation: no evidence of acute ischemia    RADIOLOGY  Chest x-ray:  I independently viewed and interpreted the images; there is no focal consolidation or edema  PROCEDURES:  Critical Care performed: No  Procedures   MEDICATIONS ORDERED IN ED: Medications - No data to display   IMPRESSION / MDM / [place] / ED COURSE  I reviewed the triage vital signs and the nursing notes.  61 year old male with PMH as noted above presents with intermittent chest pain that seems to be related to anxiety and precipitated by episodes in which the patient is seeing and reading letters and words, which from the patient's description appears consistent with hallucinations.  He denies any alcohol or drug use currently.  On exam, the patient was initially hypotensive although appeared well.  During my examination his blood pressure had returned to normal with no intervention.  The physical exam is otherwise unremarkable.  EKG is nonischemic.  I reviewed the past  medical records.  The patient has no recent ED visits. He has no documented prior psychiatric hospitalizations that I am able to see.  Per the hospitalist discharge summary from 06/06/2020, he was admitted at that time due to acute on chronic diastolic CHF and decompensated cirrhosis.  In terms of the chest pain, differential diagnosis includes, but is not limited to, anxiety, musculoskeletal pain, GERD, less likely ACS.  There is no clinical evidence for CHF exacerbation.  The patient has no DVT symptoms, nor tachycardia or hypoxia, and the pain is intermittent and not present currently; therefore, I do not suspect PE, aortic dissection, or other vascular cause.  The patient's description of hearing and seeing letters and words, feel like he needs to write them down, and then becoming anxious, are concerning for acute psychiatric presentation including possible psychosis or severe anxiety.  However the patient denies SI or HI and does not appear to be an imminent danger to self or others.  Patient's presentation is most consistent with acute presentation with potential threat to life or bodily function.  I have ordered basic labs, cardiac enzymes, TSH, urine drug screen, chest x-ray, as well as psychiatry and TTS consults.  The patient is on the cardiac monitor to evaluate for evidence of arrhythmia and/or significant heart rate changes.  ----------------------------------------- 3:32 PM on 10/29/2021 -----------------------------------------  Initial troponin is negative.  Electrolytes are normal.  CBC shows no leukocytosis or other acute findings.  Chest x-ray shows no acute abnormality.  Psychiatry consult, repeat troponin, and additional labs are still pending.  I signed the patient out to the oncoming ED physician Dr. Joni Fears.   FINAL CLINICAL IMPRESSION(S) / ED DIAGNOSES   Final diagnoses:  Atypical chest pain  Anxiety     Rx / DC Orders   ED Discharge Orders     None         Note:  This document was prepared using Dragon voice recognition software and may include unintentional dictation errors.    Arta Silence, MD 10/29/21 [phone]

## 2021-10-29 NOTE — BH Assessment (Signed)
Comprehensive Clinical Assessment (CCA) Screening, Triage and Referral Note  10/29/2021 William Barrett [id number]  William Barrett, 61 year old male who presents to [facility] ED involuntarily for treatment. Per triage note, Pt states that he worries a lot, states that he will see words pop into his mind and will cause him to worry about that until he gets the worry taken care of and it is causing his chest to hurt worse.   During TTS assessment pt presents alert and oriented x 4, anxious but cooperative, and mood-congruent with affect. The pt does not appear to be responding to internal or external stimuli. Neither is the pt presenting with any delusional thinking. Pt verified the information provided to triage RN.   Pt identifies his main complaint to be that he worries because he is seeing letters and words in his mind which has been going on for the past couple of months. Patient reports he lives with his sister and this becomes an issue when she is away at work. Patient states he is alone most of the day and sometimes feels lonely. Patient was prescribed medication for anxiety and depression by his primary physician. Patient reports he is compliant and takes it as prescribed. Patient reports good eating and sleep habits. Pt denies using any illicit substances and alcohol. Pt denies current SI/HI/AH/VH. Patient was advised to follow up with PCP for outpatient treatment and therapy.    Per Barbaraann Share, NP, pt does not meet criteria for inpatient psychiatric admission.    Chief Complaint:  Chief Complaint  Patient presents with   excessive worry   Chest Pain   visual hallucinations   Visit Diagnosis: Anxiety  Patient Reported Information How did you hear about [place]? Family/Friend  What Is the Reason for Your Visit/Call Today? Patient's girlfriend brought him to the ED because patient is constantly worrying about seeing letters and words in his mind.  How Long Has This Been Causing You Problems? 1-6  months  What Do You Feel Would Help You the Most Today? Stress Management   Have You Recently Had Any Thoughts About Hurting Yourself? No  Are You Planning to Commit Suicide/Harm Yourself At This time? No   Have you Recently Had Thoughts About William Barrett? No  Are You Planning to Harm Someone at This Time? No  Explanation: No data recorded  Have You Used Any Alcohol or Drugs in the Past 24 Hours? No  How Long Ago Did You Use Drugs or Alcohol? No data recorded What Did You Use and How Much? No data recorded  Do You Currently Have a Therapist/Psychiatrist? No  Name of Therapist/Psychiatrist: No data recorded  Have You Been Recently Discharged From Any Office Practice or Programs? No data recorded Explanation of Discharge From Practice/Program: No data recorded   CCA Screening Triage Referral Assessment Type of Contact: Face-to-Face  Telemedicine Service Delivery:   Is this Initial or Reassessment? No data recorded Date Telepsych consult ordered in [facility]:  No data recorded Time Telepsych consult ordered in [facility]:  No data recorded Location of Assessment: [facility] ED  Provider Location: [place] ED   Collateral Involvement: None provided   Does Patient Have a [place]? No data recorded Name and Contact of Legal Guardian: No data recorded If Minor and Not Living with Parent(s), Who has Custody? n/a  Is CPS involved or ever been involved? Never  Is APS involved or ever been involved? Never   Patient Determined To Be At Risk for Harm  To Self or Others Based on Review of Patient Reported Information or Presenting Complaint? No  Method: No data recorded Availability of Means: No data recorded Intent: No data recorded Notification Required: No data recorded Additional Information for Danger to Others Potential: No data recorded Additional Comments for Danger to Others Potential: No data recorded Are There Guns or Other Weapons in Your Home? No data  recorded Types of Guns/Weapons: No data recorded Are These Weapons Safely Secured?                            No data recorded Who Could Verify You Are Able To Have These Secured: No data recorded Do You Have any Outstanding Charges, Pending Court Dates, Parole/Probation? No data recorded Contacted To Inform of Risk of Harm To Self or Others: No data recorded  Does Patient Present under Involuntary Commitment? No  IVC Papers Initial File Date: No data recorded  [place] of Residence: [place]   Patient Currently Receiving the Following Services: Medication Management   Determination of Need: Emergent (2 hours)   Options For Referral: Medication Management; ED Visit; Outpatient Therapy   Discharge Disposition:     William Barrett, Counselor, LCAS-A

## 2021-10-29 NOTE — ED Provider Notes (Signed)
Procedures     ----------------------------------------- 4:52 PM on 10/29/2021 -----------------------------------------  Labs including serial troponins are unremarkable.  Symptoms discussed with psychiatry after their evaluation, they note that he is not acutely psychotic, not a danger to himself or others and low risk for outpatient follow-up.  Their impression is that his symptoms are a complex anxiety syndrome.    Carrie Mew, MD 10/29/21 [phone]

## 2021-11-18 ENCOUNTER — Other Ambulatory Visit (HOSPITAL_COMMUNITY): Payer: Self-pay | Admitting: Internal Medicine

## 2021-11-18 DIAGNOSIS — F29 Unspecified psychosis not due to a substance or known physiological condition: Secondary | ICD-10-CM | POA: Diagnosis not present

## 2021-11-18 DIAGNOSIS — I10 Essential (primary) hypertension: Secondary | ICD-10-CM | POA: Diagnosis not present

## 2021-12-24 ENCOUNTER — Ambulatory Visit (HOSPITAL_COMMUNITY): Payer: Medicare HMO | Admitting: Psychiatry

## 2022-02-17 DIAGNOSIS — E782 Mixed hyperlipidemia: Secondary | ICD-10-CM | POA: Diagnosis not present

## 2022-02-17 DIAGNOSIS — I10 Essential (primary) hypertension: Secondary | ICD-10-CM | POA: Diagnosis not present

## 2022-02-24 DIAGNOSIS — R011 Cardiac murmur, unspecified: Secondary | ICD-10-CM | POA: Diagnosis not present

## 2022-02-24 DIAGNOSIS — Z0001 Encounter for general adult medical examination with abnormal findings: Secondary | ICD-10-CM | POA: Diagnosis not present

## 2022-02-24 DIAGNOSIS — D6949 Other primary thrombocytopenia: Secondary | ICD-10-CM | POA: Diagnosis not present

## 2022-02-24 DIAGNOSIS — I700 Atherosclerosis of aorta: Secondary | ICD-10-CM | POA: Diagnosis not present

## 2022-02-24 DIAGNOSIS — I5042 Chronic combined systolic (congestive) and diastolic (congestive) heart failure: Secondary | ICD-10-CM | POA: Diagnosis not present

## 2022-02-24 DIAGNOSIS — I10 Essential (primary) hypertension: Secondary | ICD-10-CM | POA: Diagnosis not present

## 2022-02-24 DIAGNOSIS — R7303 Prediabetes: Secondary | ICD-10-CM | POA: Diagnosis not present

## 2022-02-24 DIAGNOSIS — K7460 Unspecified cirrhosis of liver: Secondary | ICD-10-CM | POA: Diagnosis not present

## 2022-02-24 DIAGNOSIS — E782 Mixed hyperlipidemia: Secondary | ICD-10-CM | POA: Diagnosis not present

## 2022-03-04 NOTE — Progress Notes (Deleted)
CARDIOLOGY CONSULT NOTE       Patient ID: William Barrett MRN: [id number] DOB/AGE: March 19, 1960 61 y.o.  Admit date: (Not on file) Referring Physician: [name] Primary Physician: [name], MD Primary Cardiologist: [name]   HPI:  61 y.o. referred by Dr [name] for CHF.  First seen by me 12/13/20 History of ETOH abuse, HTN, Anemia. History of murmur Noted echo done 05/27/20 showed EF >75% severe LVH with normal diastolic parameters Mild RVE/RV decreased function Mild AV sclerosis normal MV and no evidence of pulmonary HTN.  He has cirrhosis EGD 09/09/20 showed mild grade on varices. Followed by Dr [name]  CXR 06/01/20 suggested chronic CE with interstitial lung changes  6 months ago his BNP was only 152   He was seen by Dr [name] in 2014 noted huge beer consumption since age 63 and smoking up to 3 ppd since age 82 was started on beta blocker for tachycardia  He says he has quit ETOH He is on disability for axniety He is overweight and sedentary Does not complain of PND/Orthopnea, or chest pain He still smokes over 2 ppd   He did not show up for his myovue ordered 12/13/20   He lives with sister and appears to have some Schizo affective disorder and was seen in [place] ER by behavioral health 10/29/21   ***  ROS All other systems reviewed and negative except as noted above  Past Medical History:  Diagnosis Date   Alcoholism ([name])    Anemia    Cardiac murmur    Has been evaluated by cardiology with echocardiogram in 2014 without significant findings   Hypertension    Tachycardia, unspecified    Thrombocytopenia (HCC)    Undiagnosed cardiac murmurs     Family History  Problem Relation Age of Onset   Hypertension Mother    Hypertension Father    Liver disease Neg Hx    Colon cancer Neg Hx     Social History   Socioeconomic History   Marital status: Single    Spouse name: Not on file   Number of children: 0   Years of education: Not on file   Highest education level: Not on  file  Occupational History   Occupation: disability  Tobacco Use   Smoking status: Every Day    Packs/day: 3.00    Years: 25.00    Total pack years: 75.00    Types: Cigarettes, Cigars   Smokeless tobacco: Never  Vaping Use   Vaping Use: Never used  Substance and Sexual Activity   Alcohol use: Not Currently    Comment: 2 40oz beers daily, patient quit drinking March 2022    Drug use: No   Sexual activity: Not Currently  Other Topics Concern   Not on file  Social History Narrative   Not on file   Social Determinants of Health   Financial Resource Strain: Not on file  Food Insecurity: Not on file  Transportation Needs: Not on file  Physical Activity: Not on file  Stress: Not on file  Social Connections: Not on file  Intimate Partner Violence: Not on file    Past Surgical History:  Procedure Laterality Date   BIOPSY  09/09/2020   Procedure: BIOPSY;  Surgeon: [name], MD;  [facility];  Service: Endoscopy;;  gastric   COLONOSCOPY WITH PROPOFOL N/A 09/09/2020   Procedure: COLONOSCOPY WITH PROPOFOL;  Surgeon: [name], MD;  [facility]: [facility];  Service: Endoscopy;  Laterality: N/A;  10:45   ESOPHAGOGASTRODUODENOSCOPY  09/21/2008   RMR: Distal esophageal erosions consistent with erosive reflux esophagitis.Two benign-appearing prepyloric ulcers, otherwise unremarkable/small HH   ESOPHAGOGASTRODUODENOSCOPY (EGD) WITH PROPOFOL N/A 09/09/2020   Procedure: ESOPHAGOGASTRODUODENOSCOPY (EGD) WITH PROPOFOL;  Surgeon: [name], MD;  [facility];  Service: Endoscopy;  Laterality: N/A;   NO PAST SURGERIES     POLYPECTOMY  09/09/2020   Procedure: POLYPECTOMY;  Surgeon: [name], MD;  [facility];  Service: Endoscopy;;  ascending x2;descending      Current Outpatient Medications:    carvedilol (COREG) 6.25 MG tablet, Take 1 tablet (6.25 mg total) by mouth 2 (two) times daily with a meal. Do not take metoprolol. Return in 2  weeks for BP, pulse check., Disp: 60 tablet, Rfl: 3   furosemide (LASIX) 40 MG tablet, Take 1 tablet (40 mg total) by mouth daily. (Patient taking differently: Take 40 mg by mouth in the morning.), Disp: 30 tablet, Rfl: 1   lisinopril (ZESTRIL) 20 MG tablet, Take 20 mg by mouth daily., Disp: , Rfl:    potassium chloride SA (KLOR-CON) 20 MEQ tablet, Take 1 tablet (20 mEq total) by mouth daily., Disp: 30 tablet, Rfl: 11   sertraline (ZOLOFT) 50 MG tablet, Take 50 mg by mouth daily., Disp: , Rfl:     Physical Exam: There were no vitals taken for this visit.   Affect appropriate Chronically ill male  HEENT: normal Neck supple with no adenopathy JVP normal no bruits no thyromegaly Lungs clear with no wheezing and good diaphragmatic motion Heart:  S1/S2 no murmur, no rub, gallop or click PMI normal Abdomen: benighn, BS positve, no tenderness, no AAA no bruit.  No HSM or HJR Distal pulses intact with no bruits No edema Neuro non-focal Skin warm and dry No muscular weakness   Labs:   Lab Results  Component Value Date   WBC 8.6 10/29/2021   HGB 15.0 10/29/2021   HCT 42.8 10/29/2021   MCV 96.8 10/29/2021   PLT 114 (L) 10/29/2021   No results for input(s): "NA", "K", "CL", "CO2", "BUN", "CREATININE", "CALCIUM", "PROT", "BILITOT", "ALKPHOS", "ALT", "AST", "GLUCOSE" in the last 168 hours.  Invalid input(s): "LABALBU" Lab Results  Component Value Date   NLZJQBH 419 (H) 06/21/2012   CKMB 1.4 09/21/2008   TROPONINI <0.30 06/18/2012     Radiology: No results found.  EKG: SR LAE low voltage 05/27/20    ASSESSMENT AND PLAN:   CHF:  not clear of diagnosis TTE 05/27/20 showed normal systolic and diastolic function He does have severe LVH and BNP has been mildly elevated in past. Biggest issue is lifestyle will update TTE and re order myovue if he will show up  Cirrhosis: continues to have varices and thrombocytopenia f/u GI continue diuretic and beta blocker Smoking:  no motivation  to quit CXR 06/01/20 suggested ILD lung cancer screening CT re-ordered no show for last one  HLD:  labs with primary refuses statin    Lung Cancer CT Lexiscan myovue TTE   Discussed with patient no need to f/u with cardiology if he does not follow through with his testing again   Signed: Jenkins Rouge 03/04/2022, 4:15 PM

## 2022-03-12 ENCOUNTER — Ambulatory Visit: Payer: Medicare HMO | Admitting: Cardiovascular Disease

## 2022-03-13 ENCOUNTER — Encounter: Payer: Self-pay | Admitting: Cardiovascular Disease

## 2022-08-20 DIAGNOSIS — I10 Essential (primary) hypertension: Secondary | ICD-10-CM | POA: Diagnosis not present

## 2022-08-20 DIAGNOSIS — E782 Mixed hyperlipidemia: Secondary | ICD-10-CM | POA: Diagnosis not present

## 2022-08-20 DIAGNOSIS — R7303 Prediabetes: Secondary | ICD-10-CM | POA: Diagnosis not present

## 2022-08-23 LAB — LAB REPORT - SCANNED
A1c: 5.9
Albumin, Urine POC: 4.1
Creatinine, POC: 88.5 mg/dL
EGFR: 68
Microalb Creat Ratio: 5

## 2022-08-26 ENCOUNTER — Encounter: Payer: Self-pay | Admitting: Internal Medicine

## 2022-08-26 DIAGNOSIS — I700 Atherosclerosis of aorta: Secondary | ICD-10-CM | POA: Diagnosis not present

## 2022-08-26 DIAGNOSIS — I110 Hypertensive heart disease with heart failure: Secondary | ICD-10-CM | POA: Diagnosis not present

## 2022-08-26 DIAGNOSIS — E782 Mixed hyperlipidemia: Secondary | ICD-10-CM | POA: Diagnosis not present

## 2022-08-26 DIAGNOSIS — I5042 Chronic combined systolic (congestive) and diastolic (congestive) heart failure: Secondary | ICD-10-CM | POA: Diagnosis not present

## 2022-08-26 DIAGNOSIS — R011 Cardiac murmur, unspecified: Secondary | ICD-10-CM | POA: Diagnosis not present

## 2022-08-26 DIAGNOSIS — Z0001 Encounter for general adult medical examination with abnormal findings: Secondary | ICD-10-CM | POA: Diagnosis not present

## 2022-08-26 DIAGNOSIS — F17210 Nicotine dependence, cigarettes, uncomplicated: Secondary | ICD-10-CM | POA: Diagnosis not present

## 2022-08-26 DIAGNOSIS — D6949 Other primary thrombocytopenia: Secondary | ICD-10-CM | POA: Diagnosis not present

## 2022-08-26 DIAGNOSIS — I10 Essential (primary) hypertension: Secondary | ICD-10-CM | POA: Diagnosis not present

## 2022-09-15 ENCOUNTER — Ambulatory Visit (INDEPENDENT_AMBULATORY_CARE_PROVIDER_SITE_OTHER): Payer: Medicare HMO | Admitting: Gastroenterology

## 2022-09-15 ENCOUNTER — Encounter: Payer: Self-pay | Admitting: Gastroenterology

## 2022-09-15 VITALS — BP 126/62 | HR 68 | Temp 97.9°F | Ht 69.0 in | Wt 228.0 lb

## 2022-09-15 DIAGNOSIS — K7030 Alcoholic cirrhosis of liver without ascites: Secondary | ICD-10-CM

## 2022-09-15 NOTE — Progress Notes (Signed)
GI Office Note    Referring Provider: Benita Stabile, MD Primary Care Physician:  Benita Stabile, MD  Primary Gastroenterologist: Roetta Sessions, MD   Chief Complaint   Chief Complaint  Patient presents with   Cirrhosis    History of Present Illness   William Barrett is a 62 y.o. male presenting today at the request of Dr. Margo Aye for follow-up of cirrhosis.  Patient last seen in our office in November 2022.  He has a history of cirrhosis, suspected alcohol induced.  History of grade 1 esophageal varices, started nonselective beta-blocker after his last visit with [place].  Labs from June 2024: Hemoglobin A1c 5.9, albumin 4.1, total bilirubin 0.4, alkaline phosphatase 80, AST 21, ALT 24, BUN 18, creatinine 1.21, sodium 140, potassium 4.9, white blood cell count 6000, hemoglobin 13.7, MCV 100, platelets 101,000.  He is overdue for hepatoma screening.  He has been sober since March 2022.  Continues to smoke cigarettes, 2 to 3 packs/day.  He denies any abdominal pain.  Bowel movements are regular.  No blood in the stool or melena.  No heartburn, vomiting, dysphagia.  He has mild lower extremity edema well-controlled with diuretics.  MELD sodium of 6 in December 2022.  EGD June 2022: -Grade 1 esophageal varices.  -Mild portal gastropathy.  -Prepyloric mucosal edema and erosion. Status post biopsy.  Superficial gastric mucosa with mild hyperplastic changes.  No intestinal metaplasia.  H. pylori stains pending.   Colonoscopy June 2022: -Three 5 to 7 mm polyps in the descending colon and in the ascending colon, removed with a cold snare. Resected and retrieved.  Tubular adenomas. -Cecal AVM. -The examination was otherwise normal on direct and retroflexion views. -Next colonoscopy due in June 2025.     Medications   Current Outpatient Medications  Medication Sig Dispense Refill   carvedilol (COREG) 6.25 MG tablet Take 1 tablet (6.25 mg total) by mouth 2 (two) times daily with a meal. Do  not take metoprolol. Return in 2 weeks for BP, pulse check. 60 tablet 3   furosemide (LASIX) 40 MG tablet Take 1 tablet (40 mg total) by mouth daily. (Patient taking differently: Take 40 mg by mouth in the morning.) 30 tablet 1   lisinopril (ZESTRIL) 20 MG tablet Take 20 mg by mouth daily.     potassium chloride SA (KLOR-CON) 20 MEQ tablet Take 1 tablet (20 mEq total) by mouth daily. 30 tablet 11   No current facility-administered medications for this visit.    Allergies   Allergies as of 09/15/2022   (No Known Allergies)       Review of Systems   General: Negative for anorexia, weight loss, fever, chills, fatigue, weakness. ENT: Negative for hoarseness, difficulty swallowing , nasal congestion. CV: Negative for chest pain, angina, palpitations, dyspnea on exertion, +peripheral edema.  Respiratory: Negative for dyspnea at rest, dyspnea on exertion, cough, sputum, wheezing.  GI: See history of present illness. GU:  Negative for dysuria, hematuria, urinary incontinence, urinary frequency, nocturnal urination.  Endo: Negative for unusual weight change.     Physical Exam   BP 126/62 (BP Location: Right Arm, Patient Position: Sitting, Cuff Size: Large)   Pulse 68   Temp 97.9 F (36.6 C) (Oral)   Ht 5\' 9"  (1.753 m)   Wt 228 lb (103.4 kg)   SpO2 93%   BMI 33.67 kg/m    General: Well-nourished, well-developed in no acute distress.  Eyes: No icterus. Mouth: Oropharyngeal mucosa moist and pink  Lungs: Clear to auscultation bilaterally.  Heart: Regular rate and rhythm, no murmurs rubs or gallops.  Abdomen: Bowel sounds are normal, nontender, nondistended, no hepatosplenomegaly or masses,  no abdominal bruits or hernia , no rebound or guarding.  Rectal: not performed Extremities: No lower extremity edema. No clubbing or deformities. Neuro: Alert and oriented x 4   Skin: Warm and dry, no jaundice.   Psych: Alert and cooperative, normal mood and affect.  Labs  See hpi Imaging  Studies   No results found.  Assessment   Cirrhosis: Suspected due to prior alcohol use.  Fortunately he has been sober since March 2022.  Has had well compensated disease.  He does have a history of grade 1 esophageal varices at time of endoscopy in 2022, has been on carvedilol 6.25 mg twice daily both for reducing portal hypertension pressures and for cardiac reasons.  Discussed with patient at length today, he needs to maintain follow-up at least twice yearly for his cirrhosis.  Plans for labs and ultrasound for hepatoma screening every 6 months.  Patient voiced understanding.   PLAN   Update labs to calculate MELD 3.0, hepatoma screening. Abdominal ultrasound. Continue alcohol cessation. Return to the office in 6 months. He will be due for surveillance colonoscopy next year.   Leanna Battles. Melvyn Neth, MHS, PA-C [facility]

## 2022-09-15 NOTE — Patient Instructions (Addendum)
Continue carvediolol 6.25mg  twice daily.  Abdominal ultrasound to be scheduled at [facility]. When you go for ultrasound, please have your labs done at same time at [facility] lab or at [facility].  Congratulations on quitting alcohol use! Return office visit in six months.

## 2022-09-28 ENCOUNTER — Ambulatory Visit (HOSPITAL_COMMUNITY): Payer: Medicare HMO

## 2022-10-13 ENCOUNTER — Ambulatory Visit (HOSPITAL_COMMUNITY)
Admission: RE | Admit: 2022-10-13 | Discharge: 2022-10-13 | Disposition: A | Discharge status: Home / Self Care | Payer: Medicare HMO | Source: Ambulatory Visit | Attending: Gastroenterology | Admitting: Gastroenterology

## 2022-10-13 DIAGNOSIS — K7030 Alcoholic cirrhosis of liver without ascites: Secondary | ICD-10-CM | POA: Diagnosis not present

## 2022-10-13 DIAGNOSIS — R161 Splenomegaly, not elsewhere classified: Secondary | ICD-10-CM | POA: Diagnosis not present

## 2022-10-13 DIAGNOSIS — Z1289 Encounter for screening for malignant neoplasm of other sites: Secondary | ICD-10-CM | POA: Diagnosis not present

## 2022-10-13 DIAGNOSIS — K8020 Calculus of gallbladder without cholecystitis without obstruction: Secondary | ICD-10-CM | POA: Diagnosis not present

## 2022-10-13 DIAGNOSIS — F1010 Alcohol abuse, uncomplicated: Secondary | ICD-10-CM | POA: Diagnosis not present

## 2022-10-20 ENCOUNTER — Other Ambulatory Visit (HOSPITAL_COMMUNITY)
Admission: RE | Admit: 2022-10-20 | Discharge: 2022-10-20 | Disposition: A | Discharge status: Home / Self Care | Payer: Medicare HMO | Source: Ambulatory Visit | Attending: Gastroenterology | Admitting: Gastroenterology

## 2022-10-20 DIAGNOSIS — K7030 Alcoholic cirrhosis of liver without ascites: Secondary | ICD-10-CM | POA: Insufficient documentation

## 2022-10-20 LAB — COMPREHENSIVE METABOLIC PANEL
ALT: 26 U/L (ref 0–44)
AST: 23 U/L (ref 15–41)
Albumin: 3.9 g/dL (ref 3.5–5.0)
Alkaline Phosphatase: 59 U/L (ref 38–126)
Anion gap: 10 (ref 5–15)
BUN: 27 mg/dL — ABNORMAL HIGH (ref 8–23)
CO2: 30 mmol/L (ref 22–32)
Calcium: 9.2 mg/dL (ref 8.9–10.3)
Chloride: 97 mmol/L — ABNORMAL LOW (ref 98–111)
Creatinine, Ser: 1.19 mg/dL (ref 0.61–1.24)
GFR, Estimated: >60 mL/min (ref >60)
Glucose, Bld: 112 mg/dL — ABNORMAL HIGH (ref 70–99)
Potassium: 4.4 mmol/L (ref 3.5–5.1)
Sodium: 137 mmol/L (ref 135–145)
Total Bilirubin: 0.6 mg/dL (ref 0.3–1.2)
Total Protein: 7.7 g/dL (ref 6.5–8.1)

## 2022-10-20 LAB — PROTIME-INR
INR: 1 (ref 0.8–1.2)
Prothrombin Time: 13 seconds (ref 11.4–15.2)

## 2022-10-26 NOTE — Progress Notes (Signed)
  Cardiology Office Note:  .   Date:  11/09/2022  ID:  William Barrett, DOB 12-Dec-1960, MRN [id number] PCP: Benita Stabile, MD  [facility] Providers Cardiologist:  Charlton Haws, MD    History of Present Illness: .   William Barrett is a 62 y.o. male with history of HTN, ETOH, cirrosis, anxiety, HLD  Patient saw Dr. Eden Emms 11/2020 for ? CHF and ordered NST that was never done.Echo 05/2020 EF >75%, severe LVH.  Patient comes in with his sister. Denies chest pain, dyspnea, palpitations, edema. Works in his yard and around the house. Smokes 3 packs a day. Hasn't had any alcohol in 2 yrs.   ROS:    Studies Reviewed: Marland Kitchen    EKG Interpretation Date/Time:  Monday November 09 2022 12:44:25 EDT Ventricular Rate:  76 PR Interval:  174 QRS Duration:  100 QT Interval:  392 QTC Calculation: 441 R Axis:   39  Text Interpretation: Normal sinus rhythm Septal infarct (cited on or before 29-Oct-2021) When compared with ECG of 29-Oct-2021 13:21, Left anterior fascicular block is no longer Present Confirmed by Jacolyn Reedy [phone]) on 11/09/2022 1:00:09 PM    Prior CV Studies:   Echo IMPRESSIONS     1. Left ventricular ejection fraction, by estimation, is >75%. The left  ventricle has hyperdynamic function. The left ventricle has no regional  wall motion abnormalities. There is severe left ventricular hypertrophy.  Left ventricular diastolic  parameters were normal.   2. Right ventricular systolic function is mildly reduced. The right  ventricular size is mildly enlarged.   3. Right atrial size was mildly dilated.   4. The aortic valve is tricuspid. Aortic valve regurgitation is not  visualized. Mild aortic valve sclerosis is present, with no evidence of  aortic valve stenosis.   5. The inferior vena cava is normal in size with greater than 50%  respiratory variability, suggesting right atrial pressure of 3 mmHg.    Risk Assessment/Calculations:             Physical Exam:   VS:   BP 124/82   Pulse 76   Wt 230 lb (104.3 kg)   SpO2 94%   BMI 33.97 kg/m    Wt Readings from Last 3 Encounters:  11/09/22 230 lb (104.3 kg)  09/15/22 228 lb (103.4 kg)  10/29/21 223 lb (101.2 kg)    GEN: Well nourished, well developed in no acute distress NECK: No JVD; No carotid bruits CARDIAC:  RRR, no murmurs, rubs, gallops RESPIRATORY:  Clear to auscultation without rales, wheezing or rhonchi  ABDOMEN: Soft, non-tender, non-distended EXTREMITIES:  No edema; No deformity   ASSESSMENT AND PLAN: .   HTN-BP well controlled on lisinopril/coreg, lasix,K. Recent labs stable. 2 gm sodium diet, 150 min exercise.  Tobacco abuse-smoking cessation discussed  History of ETOH-quit 2 yrs ago  History of cirrhosis followed by GI  HLD-LDL 144, trig 134 08/2022 not on meds due to cirrhosis. Recent LFT's stable.   Carotid bruit on right-will check dopplers        Dispo: f/u  Signed, Jacolyn Reedy, PA-C

## 2022-11-09 ENCOUNTER — Ambulatory Visit: Payer: Medicare HMO | Attending: Physician Assistant | Admitting: Physician Assistant

## 2022-11-09 ENCOUNTER — Encounter: Payer: Self-pay | Admitting: Physician Assistant

## 2022-11-09 VITALS — BP 124/82 | HR 76 | Wt 230.0 lb

## 2022-11-09 DIAGNOSIS — Z720 Tobacco use: Secondary | ICD-10-CM

## 2022-11-09 DIAGNOSIS — K7030 Alcoholic cirrhosis of liver without ascites: Secondary | ICD-10-CM

## 2022-11-09 DIAGNOSIS — E782 Mixed hyperlipidemia: Secondary | ICD-10-CM

## 2022-11-09 DIAGNOSIS — R0989 Other specified symptoms and signs involving the circulatory and respiratory systems: Secondary | ICD-10-CM | POA: Diagnosis not present

## 2022-11-09 DIAGNOSIS — I10 Essential (primary) hypertension: Secondary | ICD-10-CM

## 2022-11-09 NOTE — Patient Instructions (Addendum)
Medication Instructions:  Your physician recommends that you continue on your current medications as directed. Please refer to the Current Medication list given to you today.   Labwork: None today  Testing/Procedures: Your physician has requested that you have a carotid duplex. This test is an ultrasound of the carotid arteries in your neck. It looks at blood flow through these arteries that supply the brain with blood. Allow one hour for this exam. There are no restrictions or special instructions.   Follow-Up: 1 year with Dr.Nishan  Any Other Special Instructions Will Be Listed Below (If Applicable).   STOP Smoking  If you need a refill on your cardiac medications before your next appointment, please call your pharmacy.

## 2022-11-13 ENCOUNTER — Ambulatory Visit (HOSPITAL_COMMUNITY)
Admission: RE | Admit: 2022-11-13 | Discharge: 2022-11-13 | Disposition: A | Discharge status: Home / Self Care | Payer: Medicare HMO | Source: Ambulatory Visit | Attending: Physician Assistant | Admitting: Physician Assistant

## 2022-11-13 DIAGNOSIS — R0989 Other specified symptoms and signs involving the circulatory and respiratory systems: Secondary | ICD-10-CM | POA: Diagnosis not present

## 2022-11-13 DIAGNOSIS — I6523 Occlusion and stenosis of bilateral carotid arteries: Secondary | ICD-10-CM | POA: Diagnosis not present

## 2022-11-17 ENCOUNTER — Telehealth: Payer: Self-pay | Admitting: *Deleted

## 2022-11-17 MED ORDER — ASPIRIN 81 MG PO TBEC: 81.0000 mg | DELAYED_RELEASE_TABLET | Freq: Every day | ORAL | Status: DC

## 2022-11-17 NOTE — Telephone Encounter (Signed)
Pt.notified

## 2022-11-17 NOTE — Telephone Encounter (Signed)
-----   Message from Jacolyn Reedy sent at 11/17/2022  8:19 AM EDT ----- Carotids 50-69% stenosis. He can't take statins because of cirrhosis. Please refer to lipid clinic for further recommendations. Ask him to take an ASA 81 mg daily. thanks

## 2022-11-20 NOTE — Telephone Encounter (Signed)
Patient's sister is following up requesting to discuss medication instructions again.

## 2022-11-20 NOTE — Telephone Encounter (Signed)
Spoke to pt's sister- pt gave verbal consent- and restated medication changes. Pt's sister voiced understanding.

## 2023-02-09 IMAGING — DX DG ABDOMEN ACUTE W/ 1V CHEST
4 series · 4 of 4 positions shown · non-contrast
Comparison: May 30, 2020.

CLINICAL DATA: Abdominal pain and distention.

EXAM:
DG ABDOMEN ACUTE WITH 1 VIEW CHEST

[abdomen erect ap]
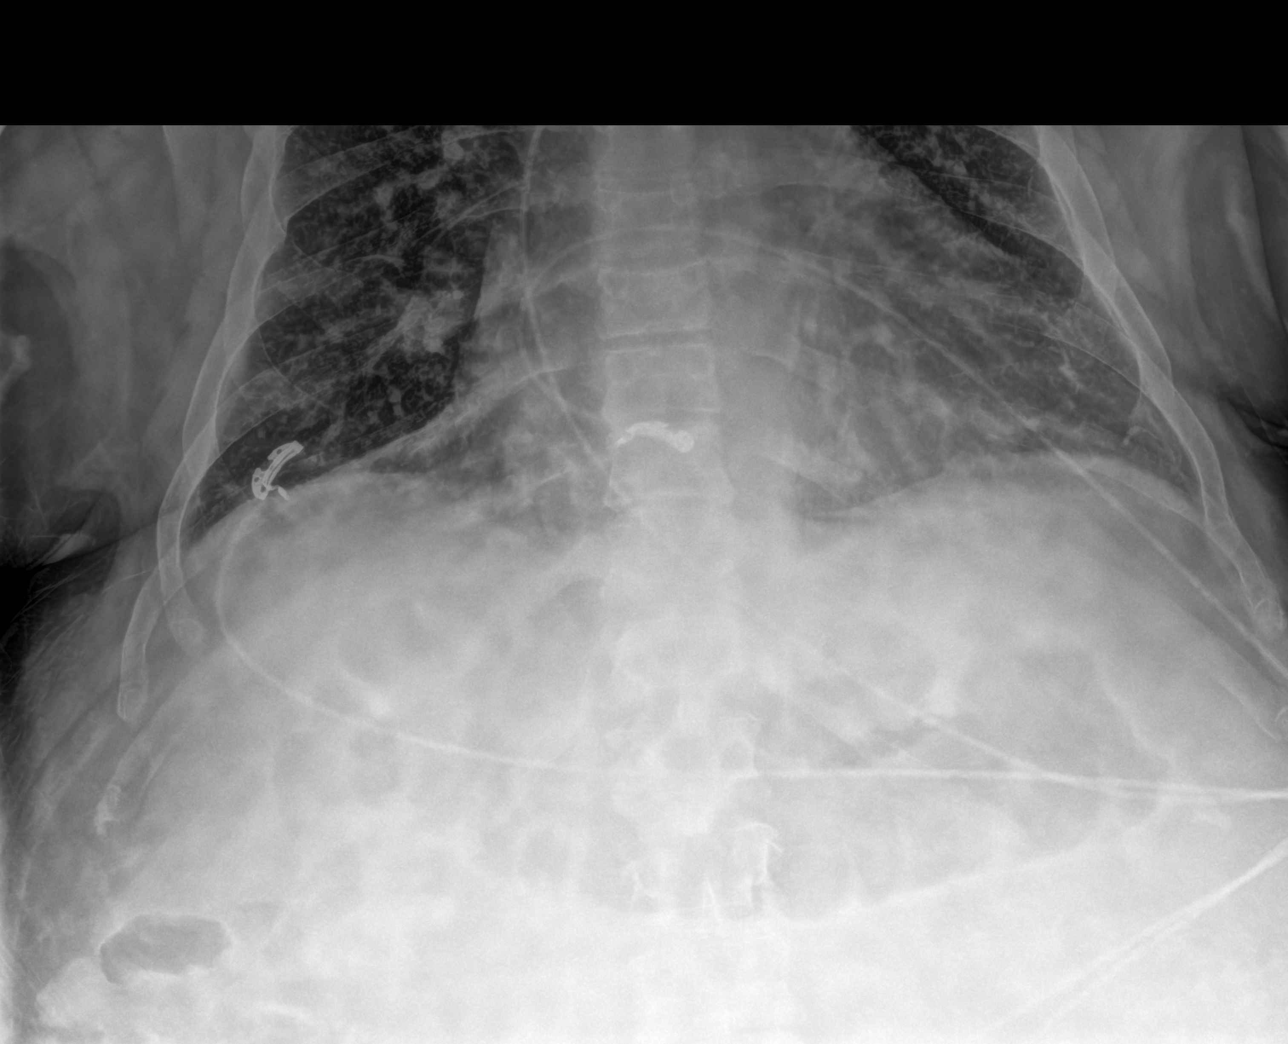

[abdomen supine (1 of 2)]
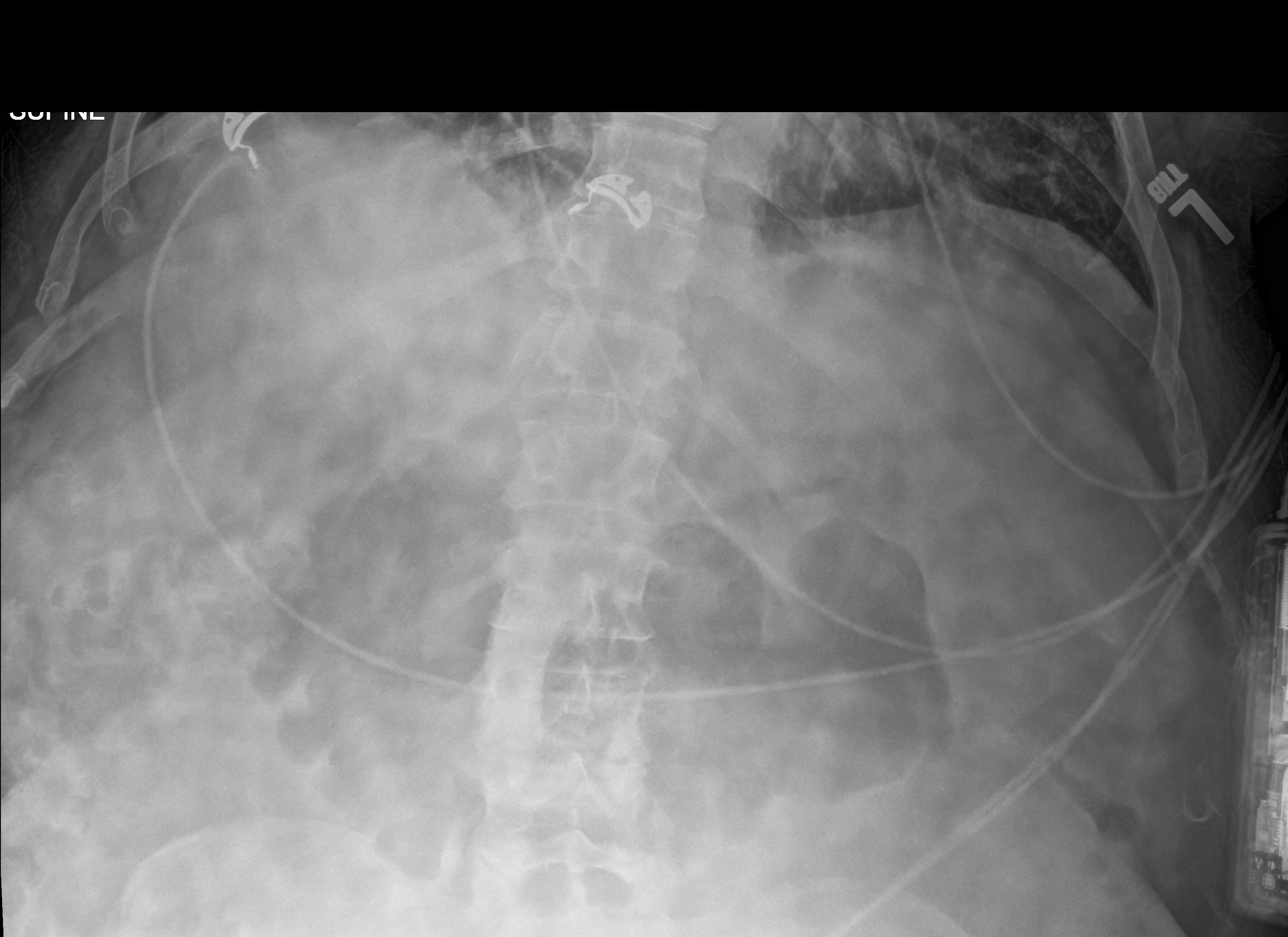

[abdomen supine (2 of 2)]
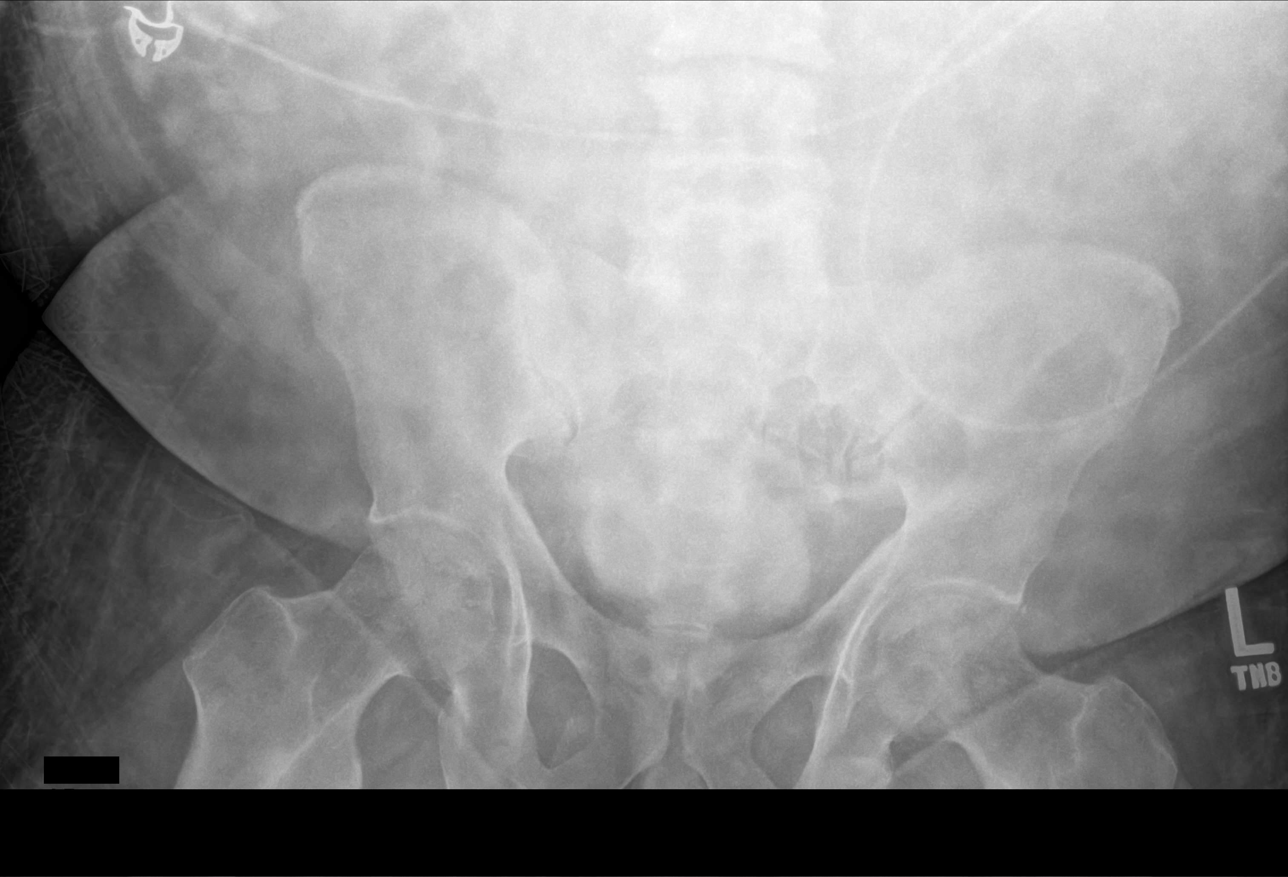

[chest ap]
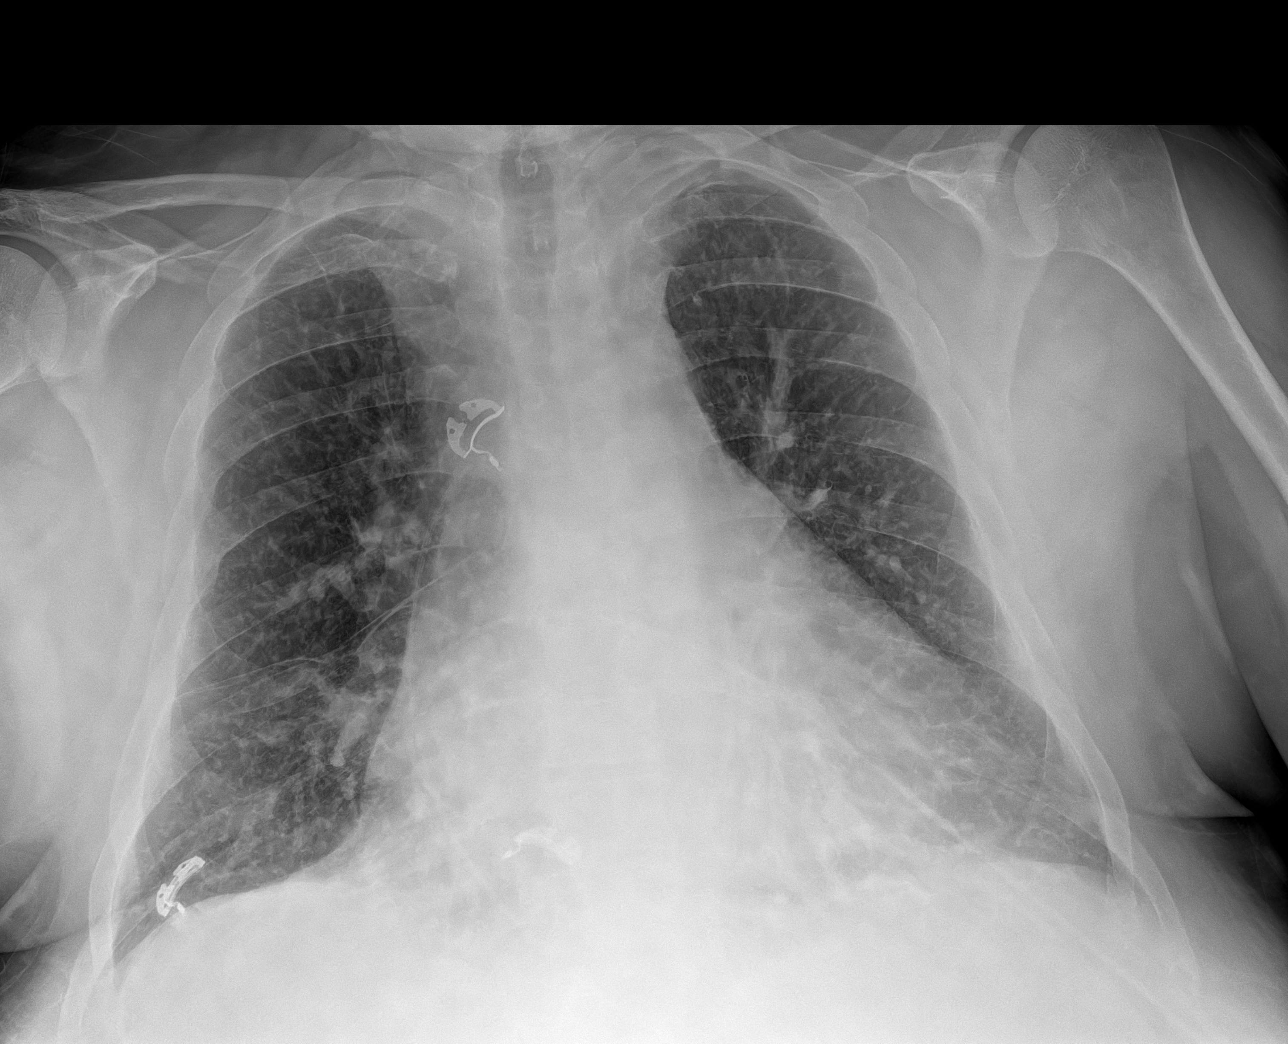

[4 of 4 positions shown; findings below may reference images not displayed]

FINDINGS: There is no evidence of free intraperitoneal air. Stable air-filled
transverse colon is noted. No significant small bowel dilatation is
noted. No radiopaque calculi or other significant radiographic
abnormality is seen. Stable cardiomegaly. Both lungs are clear.
IMPRESSION: Stable air-filled transverse colon is noted which may represent
ileus.

## 2023-02-18 DIAGNOSIS — E782 Mixed hyperlipidemia: Secondary | ICD-10-CM | POA: Diagnosis not present

## 2023-02-18 DIAGNOSIS — Z125 Encounter for screening for malignant neoplasm of prostate: Secondary | ICD-10-CM | POA: Diagnosis not present

## 2023-02-18 DIAGNOSIS — R7303 Prediabetes: Secondary | ICD-10-CM | POA: Diagnosis not present

## 2023-02-22 LAB — LAB REPORT - SCANNED
A1c: 6.2
Albumin, Urine POC: <3
Albumin/Creatinine Ratio, Urine, POC: <4
Creatinine, POC: 83.3 mg/dL
EGFR: 74

## 2023-02-25 DIAGNOSIS — D6949 Other primary thrombocytopenia: Secondary | ICD-10-CM | POA: Diagnosis not present

## 2023-02-25 DIAGNOSIS — E782 Mixed hyperlipidemia: Secondary | ICD-10-CM | POA: Diagnosis not present

## 2023-02-25 DIAGNOSIS — K7460 Unspecified cirrhosis of liver: Secondary | ICD-10-CM | POA: Diagnosis not present

## 2023-02-25 DIAGNOSIS — I5042 Chronic combined systolic (congestive) and diastolic (congestive) heart failure: Secondary | ICD-10-CM | POA: Diagnosis not present

## 2023-02-25 DIAGNOSIS — R011 Cardiac murmur, unspecified: Secondary | ICD-10-CM | POA: Diagnosis not present

## 2023-02-25 DIAGNOSIS — I10 Essential (primary) hypertension: Secondary | ICD-10-CM | POA: Diagnosis not present

## 2023-02-25 DIAGNOSIS — F17210 Nicotine dependence, cigarettes, uncomplicated: Secondary | ICD-10-CM | POA: Diagnosis not present

## 2023-02-25 DIAGNOSIS — I110 Hypertensive heart disease with heart failure: Secondary | ICD-10-CM | POA: Diagnosis not present

## 2023-02-25 DIAGNOSIS — I700 Atherosclerosis of aorta: Secondary | ICD-10-CM | POA: Diagnosis not present

## 2023-04-25 NOTE — Progress Notes (Signed)
 GI Office Note    Referring Provider: Omie Bickers, MD Primary Care Physician:  Omie Bickers, MD  Primary Gastroenterologist: Rheba Cedar, MD   Chief Complaint   Chief Complaint  Patient presents with   Follow-up    History of Present Illness   William Barrett is a 63 y.o. male presenting today for follow up etoh cirrhosis. Last seen 09/2022. H/o grade 1 esophageal varices, on nonselective beta blocker. MELD 3.0 of 8 at last ov.   Today: No etoh since 05/2020.  Feels well.  No abdominal pain.  Bowel movements regular.  No blood in the stool or melena.  No heartburn, vomiting, dysphagia.  Minimal lower extremity edema.  Continues to smoke cigarettes 2 to 3 packs daily or cigars.  EGD June 2022: -Grade 1 esophageal varices.  -Mild portal gastropathy.  -Prepyloric mucosal edema and erosion. Status post biopsy.  Superficial gastric mucosa with mild hyperplastic changes.  No intestinal metaplasia.  H. pylori stains negative.   Colonoscopy June 2022: -Three 5 to 7 mm polyps in the descending colon and in the ascending colon, removed with a cold snare. Resected and retrieved.  Tubular adenomas. -Cecal AVM. -The examination was otherwise normal on direct and retroflexion views. -Next colonoscopy due in June 2025.   Medications   Current Outpatient Medications  Medication Sig Dispense Refill   aspirin  EC 81 MG tablet Take 1 tablet (81 mg total) by mouth daily. Swallow whole.     carvedilol  (COREG ) 6.25 MG tablet Take 1 tablet (6.25 mg total) by mouth 2 (two) times daily with a meal. Do not take metoprolol . Return in 2 weeks for BP, pulse check. (Patient taking differently: Take 6.25 mg by mouth 2 (two) times daily with a meal.) 60 tablet 3   furosemide  (LASIX ) 40 MG tablet Take 1 tablet (40 mg total) by mouth daily. (Patient taking differently: Take 40 mg by mouth in the morning.) 30 tablet 1   lisinopril (ZESTRIL) 20 MG tablet Take 20 mg by mouth daily.     potassium  chloride SA (KLOR-CON ) 20 MEQ tablet Take 1 tablet (20 mEq total) by mouth daily. 30 tablet 11   No current facility-administered medications for this visit.    Allergies   Allergies as of 04/26/2023   (No Known Allergies)        Review of Systems   General: Negative for anorexia, weight loss, fever, chills, fatigue, weakness. ENT: Negative for hoarseness, difficulty swallowing , nasal congestion. CV: Negative for chest pain, angina, palpitations, dyspnea on exertion, peripheral edema.  Respiratory: Negative for dyspnea at rest, dyspnea on exertion, cough, sputum, wheezing.  GI: See history of present illness. GU:  Negative for dysuria, hematuria, urinary incontinence, urinary frequency, nocturnal urination.  Endo: Negative for unusual weight change.     Physical Exam   BP 128/73 (BP Location: Right Arm, Patient Position: Sitting, Cuff Size: Large)   Pulse 74   Temp 98.5 F (36.9 C) (Oral)   Ht 5\' 8"  (1.727 m)   Wt 232 lb 12.8 oz (105.6 kg)   SpO2 94%   BMI 35.40 kg/m    General: Well-nourished, well-developed in no acute distress.  Eyes: No icterus. Mouth: Oropharyngeal mucosa moist and pink   Lungs: Clear to auscultation bilaterally.  Heart: Regular rate and rhythm, no murmurs rubs or gallops.  Abdomen: Bowel sounds are normal, nontender, nondistended, no hepatosplenomegaly or masses,  no abdominal bruits or hernia , no rebound or guarding.  Rectal:  not performed  Extremities: trace bilateral lower extremity edema. Chronic venous stasis changes. No clubbing or deformities. Neuro: Alert and oriented x 4   Skin: Warm and dry, no jaundice.   Psych: Alert and cooperative, normal mood and affect.  Labs   Lab Results  Component Value Date   NA 137 10/20/2022   CL 97 (L) 10/20/2022   K 4.4 10/20/2022   CO2 30 10/20/2022   BUN 27 (H) 10/20/2022   CREATININE 1.19 10/20/2022   EGFR 74.0 02/22/2023   CALCIUM 9.2 10/20/2022   PHOS 2.1 (L) 05/29/2020   ALBUMIN  3.9  10/20/2022   GLUCOSE 112 (H) 10/20/2022   Lab Results  Component Value Date   ALT 26 10/20/2022   AST 23 10/20/2022   ALKPHOS 59 10/20/2022   BILITOT 0.6 10/20/2022   Lab Results  Component Value Date   WBC 8.6 10/29/2021   HGB 15.0 10/29/2021   HCT 42.8 10/29/2021   MCV 96.8 10/29/2021   PLT 114 (L) 10/29/2021   Lab Results  Component Value Date   INR 1.0 10/20/2022   INR 1.0 03/13/2021   INR 1.1 06/06/2020    Imaging Studies   No results found.  Assessment/Plan:   Etoh cirrhosis: -sober since 05/2020 -historically well compensated but with thrombocytopenia and grade 1 esophageal varices, is maintained on carvedilol  -update labs and hepatoma screening -return ov 08/2023 -encouraged cutting back on smoking  H/o adenomatous colon polyps -due colonoscopy 08/2023, will schedule after next [place]. Harles Lied, MHS, PA-C [facility]

## 2023-04-26 ENCOUNTER — Encounter: Payer: Self-pay | Admitting: *Deleted

## 2023-04-26 ENCOUNTER — Ambulatory Visit (INDEPENDENT_AMBULATORY_CARE_PROVIDER_SITE_OTHER): Payer: Medicare PPO | Admitting: Gastroenterology

## 2023-04-26 ENCOUNTER — Encounter: Payer: Self-pay | Admitting: Gastroenterology

## 2023-04-26 VITALS — BP 128/73 | HR 74 | Temp 98.5°F | Ht 68.0 in | Wt 232.8 lb

## 2023-04-26 DIAGNOSIS — D696 Thrombocytopenia, unspecified: Secondary | ICD-10-CM

## 2023-04-26 DIAGNOSIS — I8500 Esophageal varices without bleeding: Secondary | ICD-10-CM

## 2023-04-26 DIAGNOSIS — F17210 Nicotine dependence, cigarettes, uncomplicated: Secondary | ICD-10-CM

## 2023-04-26 DIAGNOSIS — K7030 Alcoholic cirrhosis of liver without ascites: Secondary | ICD-10-CM | POA: Diagnosis not present

## 2023-04-26 DIAGNOSIS — F1091 Alcohol use, unspecified, in remission: Secondary | ICD-10-CM | POA: Diagnosis not present

## 2023-04-26 DIAGNOSIS — Z860101 Personal history of adenomatous and serrated colon polyps: Secondary | ICD-10-CM | POA: Diagnosis not present

## 2023-04-26 NOTE — Patient Instructions (Signed)
 Complete labs and ultrasound. We will be in touch with results as available. You are due your next colonoscopy in June, so we will have you return to the office at that time. Continue to avoid all alcohol. Consider cutting back on smoking.

## 2023-05-04 ENCOUNTER — Ambulatory Visit (HOSPITAL_COMMUNITY): Payer: Medicare PPO | Attending: Gastroenterology

## 2023-06-15 DEATH — deceased

## 2023-08-03 ENCOUNTER — Encounter: Payer: Self-pay | Admitting: Gastroenterology

## 2023-08-19 ENCOUNTER — Encounter (INDEPENDENT_AMBULATORY_CARE_PROVIDER_SITE_OTHER): Payer: Self-pay | Admitting: *Deleted
